# Patient Record
Sex: Female | Born: 1955
Health system: Southern US, Community
[De-identification: ages and names within clinical notes are randomized; demographics above are authoritative.]

## PROBLEM LIST (undated history)

## (undated) DIAGNOSIS — K921 Melena: Secondary | ICD-10-CM

## (undated) DIAGNOSIS — T7840XA Allergy, unspecified, initial encounter: Secondary | ICD-10-CM

## (undated) DIAGNOSIS — J40 Bronchitis, not specified as acute or chronic: Secondary | ICD-10-CM

## (undated) DIAGNOSIS — Z720 Tobacco use: Secondary | ICD-10-CM

## (undated) DIAGNOSIS — R519 Headache, unspecified: Secondary | ICD-10-CM

## (undated) DIAGNOSIS — I10 Essential (primary) hypertension: Secondary | ICD-10-CM

## (undated) DIAGNOSIS — R55 Syncope and collapse: Secondary | ICD-10-CM

## (undated) DIAGNOSIS — E669 Obesity, unspecified: Secondary | ICD-10-CM

## (undated) DIAGNOSIS — I714 Abdominal aortic aneurysm, without rupture, unspecified: Secondary | ICD-10-CM

## (undated) DIAGNOSIS — B019 Varicella without complication: Secondary | ICD-10-CM

## (undated) DIAGNOSIS — R51 Headache: Secondary | ICD-10-CM

## (undated) DIAGNOSIS — D649 Anemia, unspecified: Secondary | ICD-10-CM

## (undated) DIAGNOSIS — G8929 Other chronic pain: Secondary | ICD-10-CM

## (undated) HISTORY — DX: Melena: K92.1

## (undated) HISTORY — DX: Allergy, unspecified, initial encounter: T78.40XA

## (undated) HISTORY — DX: Headache: R51

## (undated) HISTORY — DX: Essential (primary) hypertension: I10

## (undated) HISTORY — DX: Headache, unspecified: R51.9

## (undated) HISTORY — DX: Anemia, unspecified: D64.9

## (undated) HISTORY — DX: Varicella without complication: B01.9

## (undated) HISTORY — DX: Tobacco use: Z72.0

## (undated) HISTORY — DX: Abdominal aortic aneurysm, without rupture, unspecified: I71.40

## (undated) HISTORY — DX: Other chronic pain: G89.29

## (undated) HISTORY — DX: Abdominal aortic aneurysm, without rupture: I71.4

## (undated) HISTORY — DX: Obesity, unspecified: E66.9

## (undated) HISTORY — DX: Bronchitis, not specified as acute or chronic: J40

---

## 1996-04-09 HISTORY — PX: ABDOMINAL HYSTERECTOMY: SHX81

## 1998-07-18 ENCOUNTER — Encounter: Admission: RE | Admit: 1998-07-18 | Discharge: 1998-10-16 | Payer: Self-pay | Admitting: *Deleted

## 2001-05-22 ENCOUNTER — Encounter: Payer: Self-pay | Admitting: Pulmonary Disease

## 2001-05-22 ENCOUNTER — Encounter: Admission: RE | Admit: 2001-05-22 | Discharge: 2001-05-22 | Payer: Self-pay | Admitting: Pulmonary Disease

## 2001-08-19 ENCOUNTER — Other Ambulatory Visit: Admission: RE | Admit: 2001-08-19 | Discharge: 2001-08-19 | Payer: Self-pay | Admitting: Obstetrics and Gynecology

## 2001-08-26 ENCOUNTER — Encounter: Payer: Self-pay | Admitting: Obstetrics and Gynecology

## 2001-08-26 ENCOUNTER — Ambulatory Visit (HOSPITAL_COMMUNITY): Admission: RE | Admit: 2001-08-26 | Discharge: 2001-08-26 | Payer: Self-pay | Admitting: Obstetrics and Gynecology

## 2002-07-29 ENCOUNTER — Inpatient Hospital Stay (HOSPITAL_COMMUNITY): Admission: AD | Admit: 2002-07-29 | Discharge: 2002-08-02 | Payer: Self-pay | Admitting: Pulmonary Disease

## 2002-07-29 ENCOUNTER — Encounter: Payer: Self-pay | Admitting: Pulmonary Disease

## 2002-08-05 ENCOUNTER — Encounter (HOSPITAL_BASED_OUTPATIENT_CLINIC_OR_DEPARTMENT_OTHER): Admission: RE | Admit: 2002-08-05 | Discharge: 2002-10-13 | Payer: Self-pay | Admitting: Pulmonary Disease

## 2002-10-14 ENCOUNTER — Encounter (HOSPITAL_BASED_OUTPATIENT_CLINIC_OR_DEPARTMENT_OTHER): Admission: RE | Admit: 2002-10-14 | Discharge: 2003-01-12 | Payer: Self-pay | Admitting: Pulmonary Disease

## 2002-10-22 ENCOUNTER — Ambulatory Visit (HOSPITAL_COMMUNITY): Admission: RE | Admit: 2002-10-22 | Discharge: 2002-10-22 | Payer: Self-pay | Admitting: Gastroenterology

## 2002-10-23 ENCOUNTER — Encounter (INDEPENDENT_AMBULATORY_CARE_PROVIDER_SITE_OTHER): Payer: Self-pay | Admitting: Specialist

## 2003-11-10 ENCOUNTER — Ambulatory Visit (HOSPITAL_COMMUNITY): Admission: RE | Admit: 2003-11-10 | Discharge: 2003-11-10 | Payer: Self-pay | Admitting: Pulmonary Disease

## 2004-08-21 ENCOUNTER — Ambulatory Visit: Payer: Self-pay | Admitting: Pulmonary Disease

## 2005-06-08 ENCOUNTER — Ambulatory Visit (HOSPITAL_COMMUNITY): Admission: RE | Admit: 2005-06-08 | Discharge: 2005-06-08 | Payer: Self-pay | Admitting: Pulmonary Disease

## 2006-02-13 ENCOUNTER — Ambulatory Visit: Payer: Self-pay | Admitting: Pulmonary Disease

## 2006-02-19 LAB — CONVERTED CEMR LAB
ALT: 30 units/L (ref 0–40)
AST: 28 units/L (ref 0–37)
BUN: 9 mg/dL (ref 6–23)
Basophils Relative: 0.5 % (ref 0.0–1.0)
Calcium: 9.7 mg/dL (ref 8.4–10.5)
Chloride: 103 meq/L (ref 96–112)
Eosinophil percent: 2.5 % (ref 0.0–5.0)
Glucose, Bld: 113 mg/dL — ABNORMAL HIGH (ref 70–99)
HCT: 41.2 % (ref 36.0–46.0)
HDL: 34.9 mg/dL — ABNORMAL LOW (ref >39.0)
Hemoglobin, Urine: NEGATIVE
Hemoglobin: 12.9 g/dL (ref 12.0–15.0)
Ketones, ur: NEGATIVE mg/dL
LDL Cholesterol: 75 mg/dL (ref 0–99)
Lymphocytes Relative: 39.5 % (ref 12.0–46.0)
MCV: 80.9 fL (ref 78.0–100.0)
Microalb, Ur: 0.9 mg/dL (ref 0.0–1.9)
Monocytes Absolute: 0.7 10*3/uL (ref 0.2–0.7)
Neutrophils Relative %: 47.9 % (ref 43.0–77.0)
Nitrite: NEGATIVE
Potassium: 4.3 meq/L (ref 3.5–5.1)
TSH: 1.33 microintl units/mL (ref 0.35–5.50)
Urine Glucose: NEGATIVE mg/dL
Urobilinogen, UA: 0.2 (ref 0.0–1.0)
VLDL: 25 mg/dL (ref 0–40)
WBC: 6.8 10*3/uL (ref 4.5–10.5)
pH: 7 (ref 5.0–8.0)

## 2006-03-04 ENCOUNTER — Ambulatory Visit: Payer: Self-pay | Admitting: Pulmonary Disease

## 2006-06-12 ENCOUNTER — Ambulatory Visit (HOSPITAL_COMMUNITY): Admission: RE | Admit: 2006-06-12 | Discharge: 2006-06-12 | Payer: Self-pay | Admitting: Pulmonary Disease

## 2007-04-04 ENCOUNTER — Ambulatory Visit: Payer: Self-pay | Admitting: Pulmonary Disease

## 2007-04-04 DIAGNOSIS — Z720 Tobacco use: Secondary | ICD-10-CM | POA: Insufficient documentation

## 2007-04-04 DIAGNOSIS — E119 Type 2 diabetes mellitus without complications: Secondary | ICD-10-CM | POA: Insufficient documentation

## 2007-04-04 DIAGNOSIS — J209 Acute bronchitis, unspecified: Secondary | ICD-10-CM | POA: Insufficient documentation

## 2007-04-04 DIAGNOSIS — I10 Essential (primary) hypertension: Secondary | ICD-10-CM | POA: Insufficient documentation

## 2007-05-09 ENCOUNTER — Ambulatory Visit: Payer: Self-pay | Admitting: Pulmonary Disease

## 2007-05-09 DIAGNOSIS — R519 Headache, unspecified: Secondary | ICD-10-CM | POA: Insufficient documentation

## 2007-05-09 DIAGNOSIS — R51 Headache: Secondary | ICD-10-CM

## 2007-05-17 LAB — CONVERTED CEMR LAB
Albumin: 3.4 g/dL — ABNORMAL LOW (ref 3.5–5.2)
Alkaline Phosphatase: 61 units/L (ref 39–117)
BUN: 14 mg/dL (ref 6–23)
Bacteria, UA: NEGATIVE
Basophils Absolute: 0 10*3/uL (ref 0.0–0.1)
Basophils Relative: 0.1 % (ref 0.0–1.0)
Bilirubin Urine: NEGATIVE
Cholesterol: 125 mg/dL (ref 0–200)
Creatinine, Ser: 0.8 mg/dL (ref 0.4–1.2)
Crystals: NEGATIVE
GFR calc Af Amer: 98 mL/min
GFR calc non Af Amer: 81 mL/min
HDL: 31.1 mg/dL — ABNORMAL LOW (ref >39.0)
Hgb A1c MFr Bld: 6.6 % — ABNORMAL HIGH (ref 4.6–6.0)
Ketones, ur: NEGATIVE mg/dL
LDL Cholesterol: 77 mg/dL (ref 0–99)
MCHC: 32.7 g/dL (ref 30.0–36.0)
Monocytes Relative: 10.8 % (ref 3.0–11.0)
Neutro Abs: 2.6 10*3/uL (ref 1.4–7.7)
Nitrite: NEGATIVE
Platelets: 261 10*3/uL (ref 150–400)
Potassium: 4.5 meq/L (ref 3.5–5.1)
RBC / HPF: NONE SEEN
RDW: 14.2 % (ref 11.5–14.6)
Sodium: 143 meq/L (ref 135–145)
Specific Gravity, Urine: 1.025 (ref 1.000–1.03)
TSH: 1.97 microintl units/mL (ref 0.35–5.50)
Total Bilirubin: 0.5 mg/dL (ref 0.3–1.2)
Triglycerides: 87 mg/dL (ref 0–149)
Urine Glucose: NEGATIVE mg/dL
Urobilinogen, UA: 0.2 (ref 0.0–1.0)
VLDL: 17 mg/dL (ref 0–40)
pH: 6.5 (ref 5.0–8.0)

## 2007-08-04 ENCOUNTER — Ambulatory Visit (HOSPITAL_COMMUNITY): Admission: RE | Admit: 2007-08-04 | Discharge: 2007-08-04 | Payer: Self-pay | Admitting: Pulmonary Disease

## 2007-10-15 ENCOUNTER — Telehealth (INDEPENDENT_AMBULATORY_CARE_PROVIDER_SITE_OTHER): Payer: Self-pay | Admitting: *Deleted

## 2007-12-23 ENCOUNTER — Ambulatory Visit: Payer: Self-pay | Admitting: Pulmonary Disease

## 2007-12-23 DIAGNOSIS — J101 Influenza due to other identified influenza virus with other respiratory manifestations: Secondary | ICD-10-CM | POA: Insufficient documentation

## 2008-10-21 ENCOUNTER — Emergency Department: Payer: Self-pay | Admitting: Emergency Medicine

## 2009-03-23 ENCOUNTER — Ambulatory Visit (HOSPITAL_COMMUNITY): Admission: RE | Admit: 2009-03-23 | Discharge: 2009-03-23 | Payer: Self-pay | Admitting: Pulmonary Disease

## 2010-05-06 ENCOUNTER — Emergency Department (HOSPITAL_COMMUNITY)
Admission: EM | Admit: 2010-05-06 | Discharge: 2010-05-06 | Payer: Self-pay | Source: Home / Self Care | Admitting: Emergency Medicine

## 2010-08-25 NOTE — Assessment & Plan Note (Signed)
[facility]                             PULMONARY OFFICE NOTE   Ashlee Williams, Ashlee Williams                    MRN:          [id number]  DATE:12/23/2006                            DOB:          03-25-1956    HISTORY OF PRESENT ILLNESS:  The patient is a pleasant female patient of  Dr. Kriste Basque, who presents today for an acute office visit.  The patient  complains over the last 2 days she has had some swelling along the 2nd  digit.  The patient complains that she stuck her finger with a brush  while fixing her hair 2 days ago and has some increased redness and  swelling along this finger over the last 2 days.  The patient denies any  fever, chest pain, palpitations, rash, or drainage.   PAST MEDICAL HISTORY:  Reviewed.   CURRENT MEDICATIONS:  Reviewed.   PHYSICAL EXAMINATION:  GENERAL:  The patient is a pleasant female in no  acute distress.  HEENT:  Unremarkable.  RESPIRATORY:  Lung sounds are clear.  CARDIAC:  Regular rate and rhythm.  ABDOMEN:  Soft and nontender.  EXTREMITIES:  Warm without any edema.  Along the left 2nd digit is some  noted small amount of swelling and slight redness along the cuticle bed.   IMPRESSION:  Left 2nd digit injury with possible early cellulitis.   PLAN:  The patient is given Duricef x7 days, warm soaks.  The patient is  to apply Neosporin along the cuticle once daily.  The patient will  return here in 1 week for followup or sooner if needed.      Rubye Oaks, NP  Electronically Signed      Lonzo Cloud. Kriste Basque, MD  Electronically Signed   TP/MedQ  DD: 12/23/2006  DT: 12/23/2006  Job #: [id number]

## 2010-08-25 NOTE — Discharge Summary (Signed)
NAME:  Ashlee Williams, Ashlee Williams                  ACCOUNT NO.:  [id number]   MEDICAL RECORD NO.:  [id number]                   PATIENT TYPE:  INP   LOCATION:  5020                                 FACILITY:  [facility]   PHYSICIAN:  Lonzo Cloud. Kriste Basque, M.D. [facility]            DATE OF BIRTH:  07-06-1955   DATE OF ADMISSION:  07/29/2002  DATE OF DISCHARGE:  08/02/2002                                 DISCHARGE SUMMARY   FINAL DIAGNOSES:  1. Admitted July 29, 2002, with an acute illness characterized by fever,     multiple chills, and pain in the left leg; evaluation revealing probable     cellulitis of left leg due to an abrasion she sustained one week prior to     admission and appeared to be healing.  2. History of hypertension - controlled on medications.  3. History of obesity - the patient is on diet therapy.  4. Status post hysterectomy about five years ago.   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  She had  recently had a routine physical with normal labs including CBC that showed a  hemoglobin of 12.6, hematocrit 37.3 and white count 5300 with 43%  neutrophils.  She felt well until one day prior to admission when she  developed low grade fever, although she did not take her temperature,  multiple cold chills, and slight nausea.  She denied any upper respiratory  tract symptoms without sore throat, sinus congestion or drainage or ear  aches.  She denied any vomiting or diarrhea.  She denied cough or sputum  production.  She denied any GU or GYN symptoms.  The only localizing symptom  was some slight pain in her left thigh and ankle area.  About one week prior  to admission she had fallen and sustained an abrasion on her left shin area.  She noticed that this healed over normally and was not draining any pus.  Initially there was no swelling in the leg either.   Evaluation in the office revealed  some mild warmth but minimal erythema in  the left leg and no discernible edema or difference in size as compared to  the right leg.  Blood count in our office, however, revealed a marked  leukocytosis with a white count of 29,700 and 92% segs compared to the CBC  of a week earlier.  Her temperature in the office was 100 degrees and she  was admitted with a probable cellulitis with atypical features.   PAST MEDICAL HISTORY:  She has a history of mild hypertension controlled on  Norvasc and Avalide recently added.  She has a history of obesity and is on  diet trying to lose weight.  She had a hysterectomy about five years ago.   PHYSICAL EXAMINATION:  Physical examination at the time of admission  revealed a 55 year old black female in  no acute distress.  Blood pressure  142/84, pulse 104 and regular, respiratory rate 20 per minute and not  labored, O2 saturation 96% on room air.  Temperature was 99.6 in the office  orally.  The HEENT examination was unremarkable.  Neck examination showed no  jugular venous distension, no carotid bruits, no thyromegaly or  lymphadenopathy.  Chest examination was clear to percussion and  auscultation.  Cardiac examination was normal with a normal S1 and S2; no  murmurs, rubs, or gallops heard.  The abdomen was obese but soft and  nontender without evidence of organomegaly or masses.  Bowel sounds were  intact.  Extremities showed an abrasion on the left shin which appeared to  be healing.  There was no fluctuance there and there was no drainage.  There  was no real redness around this area or up the leg.  She was somewhat tender  in the proximal thigh medially and this was different than palpation of the  other leg.  Neurologic examination was intact.   LABORATORY DATA:  EKG showed normal sinus rhythm with slight increased  voltage and nonspecific STT wave changes.   Chest x-ray showed some accentuated bronchial markings compatible with some  mild  bronchitic change, no acute infiltrate.   CBC showed hemoglobin 12.6, hematocrit 37.9, white count 27,300 with 95%  segs initially.  Follow-up CBC down to 10,600 and final CBC down to 7600  with 52% segs, indicating a nice response to the IV antibiotics.  Sed rate  was 42, repeat 84.  Protime 14.6, INR 1.1, PTT 33 seconds.  Sodium 138,  potassium 3.7, chloride 104, CO2 29, BUN 13, creatinine 1.1, blood sugar  112, repeat 88.  Calcium 8.9, total protein 6.9, albumin 3.3, AST 17, ALT  17, alkaline phosphatase 50, total bilirubin 0.7.  CPK 63.  TSH 0.51.  Blood  cultures negative x2.   HOSPITAL COURSE:  The patient was admitted with a cellulitis in her left leg  and some atypical features.  She was started on IV Kefzol and continued on  her home medications.  The leg subsequently developed some swelling and  erythema and warmth more indicative of cellulitis.  She had a nice response  to the antibiotics with decrease in her temperature (maxed out at 102 in the  hospital) and returned to totally normal on the IV Kefzol.  Because of the  swelling, venous Dopplers were obtained and these were negative and no  evidence of DVT, superficial thrombosis or Baker cyst.  They did note  enlargement of the left inguinal lymph nodes.  This may be the etiology of  the swelling with some lymphatic obstruction.  With leg elevation and TED  hose, the swelling improved prior to discharge.  She was covered with  Lovenox during the hospital stay.  With her white count totally normal and  temperature response as well, it was felt that she was ready to switch to  oral antibiotics and discharge on August 02, 2002.  At this point the leg was  still somewhat red, especially in the calf area and there was some mild  edema noted in the distal left leg.   DISCHARGE MEDICATIONS:  1. Keflex 500 mg p.o. q.i.d. for five more days until gone. 2. Norvasc 5 mg p.o. daily.  3. Avalide 150/12.5 one tablet p.o. daily.  4.  Zyrtec 10 mg p.o. daily p.r.n. allergies.  5. Laxative of choice.  6. Tylenol as needed for pain and fever.  DISCHARGE INSTRUCTIONS:  The patient was instructed to rest at home with leg  elevated and use some warm soaks.  She was instructed on a calorie  restricted low salt diet and fitted for TED hose and told to wear those when  she is up.   FOLLOW UP:  She will follow up with me in the office on Wednesday, August 05, 2002, at 9 a.m.   CONDITION ON DISCHARGE:  The patient is discharged in improved condition.                                               Lonzo Cloud. Kriste Basque, M.D. [facility]    SMN/MEDQ  D:  08/02/2002  T:  08/03/2002  Job:  [id number]

## 2010-08-25 NOTE — Assessment & Plan Note (Signed)
[facility]                               PULMONARY OFFICE NOTE   MISTEE, SOLIMAN                     MRN:          [id number]  DATE:02/13/2006                            DOB:          Oct 08, 1955    HISTORY OF PRESENT ILLNESS:  The patient is a 55 year old African-American  female patient of Dr. Kriste Basque with known history of hypertension, diabetes  mellitus who presents for an acute office visit.  The patient complained of  a one-week history of nasal congestion, productive cough of thick, yellow  sputum and hoarseness.  The patient denies any hemoptysis, chest pain,  orthopnea, recent travel, and antibiotic use.  The patient has not been seen  in greater than a year.  Previously had been started on Glucophage for  diabetes and had previously been on Norvasc and Avelox for blood pressure;  however, has been off of her medications for some time now.  The patient  reports she missed followup.  The patient continues to smoke against medical  advice.   PAST MEDICAL HISTORY:  Reviewed.   CURRENT MEDICATIONS:  Reviewed.   PHYSICAL EXAMINATION:  GENERAL: The patient is a pleasant obese female in no  acute distress.  VITAL SIGNS: She is afebrile with stable vital signs.  HEENT:  Nasal mucosa has some mild erythema.  Nontender sinuses. Posterior  pharynx is clear.  NECK:  Supple without adenopathy.  No JVD.  LUNGS: Sounds are clear.  CARDIAC: Regular rate and rhythm.  ABDOMEN: Soft.  EXTREMITIES:  Warm without any edema.   IMPRESSION AND PLAN:  1. Acute bronchitis. The patient is to begin Avelox x5 days, Mucinex DM      twice daily, Endal HD #8 ounces, 1-2 teaspoons every 4-6 hours as      needed for cough.  The patient is to return in 2 weeks with Dr. Kriste Basque      for followup or sooner if needed.  2. History of diabetes and hypertension. Fasting labs are all pending.      The patient will return      back for complete physical exam and followup  of diabetes and high blood      pressure at her office visit in 2 weeks.      Rubye Oaks, NP  Electronically Signed      Lonzo Cloud. Kriste Basque, MD  Electronically Signed   TP/MedQ  DD: 02/13/2006  DT: 02/13/2006  Job #: [id number]

## 2010-11-27 ENCOUNTER — Other Ambulatory Visit: Payer: Self-pay | Admitting: Pulmonary Disease

## 2010-11-27 DIAGNOSIS — Z1231 Encounter for screening mammogram for malignant neoplasm of breast: Secondary | ICD-10-CM

## 2010-11-30 ENCOUNTER — Ambulatory Visit (HOSPITAL_COMMUNITY)
Admission: RE | Admit: 2010-11-30 | Discharge: 2010-11-30 | Disposition: A | Discharge status: Home / Self Care | Payer: 59 | Source: Ambulatory Visit | Attending: Pulmonary Disease | Admitting: Pulmonary Disease

## 2010-11-30 DIAGNOSIS — Z1231 Encounter for screening mammogram for malignant neoplasm of breast: Secondary | ICD-10-CM | POA: Insufficient documentation

## 2011-02-05 ENCOUNTER — Ambulatory Visit (INDEPENDENT_AMBULATORY_CARE_PROVIDER_SITE_OTHER): Payer: 59 | Admitting: Family Medicine

## 2011-02-05 ENCOUNTER — Encounter: Payer: Self-pay | Admitting: Family Medicine

## 2011-02-05 DIAGNOSIS — I10 Essential (primary) hypertension: Secondary | ICD-10-CM

## 2011-02-05 DIAGNOSIS — M79609 Pain in unspecified limb: Secondary | ICD-10-CM

## 2011-02-05 DIAGNOSIS — R011 Cardiac murmur, unspecified: Secondary | ICD-10-CM

## 2011-02-05 DIAGNOSIS — Z0000 Encounter for general adult medical examination without abnormal findings: Secondary | ICD-10-CM

## 2011-02-05 DIAGNOSIS — E119 Type 2 diabetes mellitus without complications: Secondary | ICD-10-CM

## 2011-02-05 DIAGNOSIS — F17200 Nicotine dependence, unspecified, uncomplicated: Secondary | ICD-10-CM

## 2011-02-05 DIAGNOSIS — M79643 Pain in unspecified hand: Secondary | ICD-10-CM | POA: Insufficient documentation

## 2011-02-05 LAB — POCT URINALYSIS DIPSTICK
Blood, UA: NEGATIVE
Protein, UA: NEGATIVE
Spec Grav, UA: 1.005
Urobilinogen, UA: 0.2

## 2011-02-05 MED ORDER — NAPROXEN 500 MG PO TABS: 500.0000 mg | ORAL_TABLET | Freq: Two times a day (BID) | ORAL | Status: AC

## 2011-02-05 NOTE — Progress Notes (Signed)
  Subjective:    Patient ID: Ashlee Williams, female    DOB: Mar 11, 1956, 55 y.o.   MRN: [id number]  HPI New to establish.  Previous MD- Kriste Basque.  No GYN.  HTN- chronic problem for pt, well controlled on Diovan.  No CP, SOB, HAs, visual changes, edema.  DM- was told >1 yr ago that she had 'a slight case of diabetes'.  Previous MD wanted to start Metformin, pt never started meds.  Tobacco use- smokes 1/2 ppd, wants to quit but 'easier said than done'.  Not truly invested in this.  Hand pain- sxs started 1 week ago, having difficulty w/ grip due to pain.  Does a lot of work on the computer.  Minimal relief w/ tylenol or ibuprofen.  R hand.   Review of Systems For ROS see HPI     Objective:   Physical Exam  Vitals reviewed. Constitutional: She is oriented to person, place, and time. She appears well-developed and well-nourished. No distress.  HENT:  Head: Normocephalic and atraumatic.  Eyes: Conjunctivae and EOM are normal. Pupils are equal, round, and reactive to light.  Neck: Normal range of motion. Neck supple. No thyromegaly present.  Cardiovascular: Normal rate, regular rhythm and intact distal pulses.   Murmur (II/VI SEM) heard. Pulmonary/Chest: Effort normal and breath sounds normal. No respiratory distress.  Abdominal: Soft. She exhibits no distension. There is no tenderness.  Musculoskeletal: She exhibits no edema.       Right wrist: She exhibits tenderness (over palmar surface and along 1st CMC and MCP joints). She exhibits normal range of motion.  Lymphadenopathy:    She has no cervical adenopathy.  Neurological: She is alert and oriented to person, place, and time.  Skin: Skin is warm and dry.  Psychiatric: She has a normal mood and affect. Her behavior is normal.          Assessment & Plan:

## 2011-02-05 NOTE — Patient Instructions (Signed)
Schedule your complete physical at your convenience- you can eat before this We'll notify you of your [place] appt for your heart murmur We'll notify you of your lab results Try and stop smoking!!!  Look into the classes available at [email or web address] Take the Naproxen regularly for 10 days (take w/ food) and get a padded computer work station.  If no better in 2 weeks, let me know and we'll refer you to sports medicine Call with any questions or concerns Welcome!!  We're glad to have you!!!

## 2011-02-06 LAB — HEPATIC FUNCTION PANEL
ALT: 15 U/L (ref 0–35)
Albumin: 3.8 g/dL (ref 3.5–5.2)
Alkaline Phosphatase: 71 U/L (ref 39–117)
Bilirubin, Direct: 0 mg/dL (ref 0.0–0.3)
Total Protein: 7.8 g/dL (ref 6.0–8.3)

## 2011-02-06 LAB — CBC WITH DIFFERENTIAL/PLATELET
Basophils Absolute: 0 10*3/uL (ref 0.0–0.1)
Eosinophils Relative: 4.9 % (ref 0.0–5.0)
HCT: 41.8 % (ref 36.0–46.0)
Hemoglobin: 13.4 g/dL (ref 12.0–15.0)
Lymphocytes Relative: 60.4 % — ABNORMAL HIGH (ref 12.0–46.0)
Lymphs Abs: 3.5 10*3/uL (ref 0.7–4.0)
Monocytes Relative: 11 % (ref 3.0–12.0)
Neutro Abs: 1.4 10*3/uL (ref 1.4–7.7)
RBC: 5.07 Mil/uL (ref 3.87–5.11)
RDW: 15.7 % — ABNORMAL HIGH (ref 11.5–14.6)
WBC: 5.9 10*3/uL (ref 4.5–10.5)

## 2011-02-06 LAB — LIPID PANEL
Cholesterol: 117 mg/dL (ref 0–200)
LDL Cholesterol: 58 mg/dL (ref 0–99)
Total CHOL/HDL Ratio: 3

## 2011-02-06 LAB — HEMOGLOBIN A1C: Hgb A1c MFr Bld: 6.6 % — ABNORMAL HIGH (ref 4.6–6.5)

## 2011-02-06 LAB — BASIC METABOLIC PANEL
CO2: 27 mEq/L (ref 19–32)
Chloride: 108 mEq/L (ref 96–112)
Creatinine, Ser: 0.8 mg/dL (ref 0.4–1.2)
Glucose, Bld: 80 mg/dL (ref 70–99)

## 2011-02-07 LAB — VITAMIN D 1,25 DIHYDROXY
Vitamin D2 1, 25 (OH)2: <8 pg/mL
Vitamin D3 1, 25 (OH)2: 41 pg/mL

## 2011-02-09 ENCOUNTER — Encounter: Payer: Self-pay | Admitting: *Deleted

## 2011-02-10 ENCOUNTER — Encounter: Payer: Self-pay | Admitting: Family Medicine

## 2011-02-10 NOTE — Assessment & Plan Note (Signed)
Chronic problem.  Well controlled on current meds.  Asymptomatic.  No changes. 

## 2011-02-10 NOTE — Assessment & Plan Note (Signed)
Strongly encouraged pt to quit.  She states she is interested but not ready to select a quit date and commit.  Will follow.

## 2011-02-10 NOTE — Assessment & Plan Note (Signed)
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  Start scheduled NSAIDs, adjust work station.  If no improvement will refer to SM.  Pt expressed understanding and is in agreement w/ plan.

## 2011-02-10 NOTE — Assessment & Plan Note (Signed)
Unclear as to whether pt does or doesn't have dx.  Check labs.  Start meds prn.  Reviewed importance of low carb, ADA diet and regular exercise.  Pt expressed understanding and is in agreement w/ plan.

## 2011-02-13 ENCOUNTER — Other Ambulatory Visit (HOSPITAL_COMMUNITY): Payer: 59 | Admitting: Radiology

## 2011-02-13 ENCOUNTER — Encounter: Payer: Self-pay | Admitting: Family Medicine

## 2011-02-14 ENCOUNTER — Ambulatory Visit (HOSPITAL_COMMUNITY): Payer: 59 | Attending: Cardiovascular Disease | Admitting: Radiology

## 2011-02-14 ENCOUNTER — Ambulatory Visit (INDEPENDENT_AMBULATORY_CARE_PROVIDER_SITE_OTHER): Payer: 59 | Admitting: Family Medicine

## 2011-02-14 ENCOUNTER — Encounter: Payer: Self-pay | Admitting: Family Medicine

## 2011-02-14 VITALS — BP 120/80 | HR 80 | Temp 98.5°F | Ht 64.5 in | Wt 296.6 lb

## 2011-02-14 DIAGNOSIS — E119 Type 2 diabetes mellitus without complications: Secondary | ICD-10-CM | POA: Insufficient documentation

## 2011-02-14 DIAGNOSIS — Z01419 Encounter for gynecological examination (general) (routine) without abnormal findings: Secondary | ICD-10-CM

## 2011-02-14 DIAGNOSIS — Z0000 Encounter for general adult medical examination without abnormal findings: Secondary | ICD-10-CM | POA: Insufficient documentation

## 2011-02-14 DIAGNOSIS — N951 Menopausal and female climacteric states: Secondary | ICD-10-CM | POA: Insufficient documentation

## 2011-02-14 DIAGNOSIS — I059 Rheumatic mitral valve disease, unspecified: Secondary | ICD-10-CM | POA: Insufficient documentation

## 2011-02-14 DIAGNOSIS — I079 Rheumatic tricuspid valve disease, unspecified: Secondary | ICD-10-CM | POA: Insufficient documentation

## 2011-02-14 DIAGNOSIS — I10 Essential (primary) hypertension: Secondary | ICD-10-CM

## 2011-02-14 DIAGNOSIS — R011 Cardiac murmur, unspecified: Secondary | ICD-10-CM | POA: Insufficient documentation

## 2011-02-14 DIAGNOSIS — I379 Nonrheumatic pulmonary valve disorder, unspecified: Secondary | ICD-10-CM | POA: Insufficient documentation

## 2011-02-14 DIAGNOSIS — L680 Hirsutism: Secondary | ICD-10-CM | POA: Insufficient documentation

## 2011-02-14 MED ORDER — VALSARTAN-HYDROCHLOROTHIAZIDE 160-12.5 MG PO TABS: 1.0000 | ORAL_TABLET | Freq: Every day | ORAL | Status: DC

## 2011-02-14 NOTE — Patient Instructions (Signed)
Follow up in 3 months to recheck diabetes We will call you with your nutrition appt Try and make healthy food choices and get regular exercise Call with any questions or concerns Happy Holidays!!

## 2011-02-14 NOTE — Progress Notes (Signed)
  Subjective:    Patient ID: Ashlee Williams, female    DOB: 1955-11-19, 55 y.o.   MRN: [id number]  HPI CPE- no concerns today.  Had colonoscopy in 2009 at [place], UTD on mammo.   Review of Systems Patient reports no vision/ hearing changes, adenopathy,fever, weight change,  persistant/recurrent hoarseness , swallowing issues, chest pain, palpitations, edema, persistant/recurrent cough, hemoptysis, dyspnea (rest/exertional/paroxysmal nocturnal), gastrointestinal bleeding (melena, rectal bleeding), abdominal pain, significant heartburn, bowel changes, GU symptoms (dysuria, hematuria, incontinence), Gyn symptoms (abnormal  bleeding, pain),  syncope, focal weakness, memory loss, numbness & tingling, skin/hair/nail changes, abnormal bruising or bleeding, anxiety, or depression.     Objective:   Physical Exam  General Appearance:    Alert, cooperative, no distress, appears stated age, morbidly obese  Head:    Normocephalic, without obvious abnormality, atraumatic  Eyes:    PERRL, conjunctiva/corneas clear, EOM's intact, fundi    benign, both eyes  Ears:    Normal TM's and external ear canals, both ears  Nose:   Nares normal, septum midline, mucosa normal, no drainage    or sinus tenderness  Throat:   Lips, mucosa, and tongue normal; teeth and gums normal  Neck:   Supple, symmetrical, trachea midline, no adenopathy;    Thyroid: no enlargement/tenderness/nodules  Back:     Symmetric, no curvature, ROM normal, no CVA tenderness  Lungs:     Clear to auscultation bilaterally, respirations unlabored  Chest Wall:    No tenderness or deformity   Heart:    Regular rate and rhythm, S1 and S2 normal, no murmur, rub   or gallop  Breast Exam:    No tenderness, masses, or nipple abnormality  Abdomen:     Soft, non-tender, bowel sounds active all four quadrants,    no masses, no organomegaly  Genitalia:    External genitalia normal, adnexa w/out mass or tenderness, mucosa pink and moist, no lesions or  discharge present  Rectal:    Normal external appearance  Extremities:   Extremities normal, atraumatic, no cyanosis or edema  Pulses:   2+ and symmetric all extremities  Skin:   Skin color, texture, turgor normal, no rashes or lesions  Lymph nodes:   Cervical, supraclavicular, and axillary nodes normal  Neurologic:   CNII-XII intact, normal strength, sensation and reflexes    throughout          Assessment & Plan:

## 2011-02-18 NOTE — Assessment & Plan Note (Signed)
Pt's PE WNL w/ exception of weight.  Reviewed labs from last visit.  UTD on health maintenance.  Anticipatory guidance provided.

## 2011-02-18 NOTE — Assessment & Plan Note (Signed)
Refer to nutrition for better management.

## 2011-03-15 ENCOUNTER — Encounter: Payer: 59 | Attending: Family Medicine | Admitting: *Deleted

## 2011-03-15 ENCOUNTER — Encounter: Payer: Self-pay | Admitting: *Deleted

## 2011-03-15 DIAGNOSIS — I10 Essential (primary) hypertension: Secondary | ICD-10-CM

## 2011-03-15 DIAGNOSIS — E119 Type 2 diabetes mellitus without complications: Secondary | ICD-10-CM

## 2011-03-15 DIAGNOSIS — Z713 Dietary counseling and surveillance: Secondary | ICD-10-CM | POA: Insufficient documentation

## 2011-03-15 NOTE — Progress Notes (Addendum)
  Medical Nutrition Therapy:  Appt start time: 1630 end time:  1730.  Assessment:  T2DM, HTN Pt here for MNT re: T2DM and HTN.   MEDICATIONS:  See med list; reconciled with patient.   DIETARY INTAKE:  Usual eating pattern includes 2- 2.5 meals and 0-1 snacks per day.  24-hr recall:  B ( AM): BE&C croissant at hospital cafe; OJ (8 oz)  Snk ( AM): None  L ( PM): Grilled cheese w/ FF; 16 oz lemonade/tea mix (reg) - hosp cafe Snk ( PM): None D ( PM): spaghetti w/ meat sauce, 1 pc garlic toast; kiwi-strawberry juice (reg; from [employer]) Snk ( PM): None Beverages: water  Usual physical activity: No exercise noted.  Estimated energy needs: 1400-1500 calories 150-170 g carbohydrates 105-110 g protein  40 g fat  Progress Towards Goal(s):  In progress.   Nutritional Diagnosis:  Varnell-2.1 Impaired nutrient utilization related to excessive CHO intake as evidenced by recent A1c of 6.6% and MD referral for MNT.    Intervention/Goals:  Follow Diabetes Meal Plan as instructed (yellow card).  Eat 3 meals and 2 snacks (approx every 3-5 hrs) - NO meal skipping.  Add lean protein foods to all meals/snacks.   Limit sodium to 2000 mg or less daily.  Aim for 25-30 g of fiber daily.  Aim for 30+ mins of physical activity 3-5 days/wk.  Track food intake and bring food record to your next nutrition visit.   Handouts given during visit include:  HTN class handout  Living Well with DM book - [employer]  [employer] list  45g CHO menu  Monitoring/Evaluation:  Dietary intake, exercise, A1c (as available), and body weight in 5 week(s).

## 2011-03-19 NOTE — Patient Instructions (Signed)
Goals:  Follow Diabetes Meal Plan as instructed (yellow card).  Eat 3 meals and 2 snacks (approx every 3-5 hrs) - NO meal skipping.  Add lean protein foods to all meals/snacks.   Limit sodium to 2000 mg or less daily.  Aim for 25-30 g of fiber daily.  Aim for 30+ mins of physical activity 3-5 days/wk.  Track food intake and bring food record to your next nutrition visit.

## 2011-04-24 ENCOUNTER — Ambulatory Visit: Payer: 59 | Admitting: *Deleted

## 2011-04-25 ENCOUNTER — Ambulatory Visit (INDEPENDENT_AMBULATORY_CARE_PROVIDER_SITE_OTHER): Payer: 59 | Admitting: Family Medicine

## 2011-04-25 DIAGNOSIS — J3489 Other specified disorders of nose and nasal sinuses: Secondary | ICD-10-CM

## 2011-04-25 DIAGNOSIS — J019 Acute sinusitis, unspecified: Secondary | ICD-10-CM | POA: Insufficient documentation

## 2011-04-25 DIAGNOSIS — J329 Chronic sinusitis, unspecified: Secondary | ICD-10-CM | POA: Insufficient documentation

## 2011-04-25 MED ORDER — CLARITHROMYCIN ER 500 MG PO TB24: 1000.0000 mg | ORAL_TABLET | Freq: Every day | ORAL | Status: AC

## 2011-04-25 NOTE — Patient Instructions (Signed)
This is a sinus infection Start the biaxin- 2 tabs at the same time, w/ food Mucinex to thin your congestion Drink plenty of fluids! REST! Hang in there!

## 2011-04-25 NOTE — Progress Notes (Signed)
  Subjective:    Patient ID: Ashlee Williams, female    DOB: [birth year], 56 y.o.   MRN: [id number]  HPI ? Sinus infxn- sxs started yesterday.  + L maxillary pressure, HA, nasal congestion.  No fevers.  + sick contacts.   Review of Systems For ROS see HPI     Objective:   Physical Exam  Vitals reviewed. Constitutional: She appears well-developed and well-nourished. No distress.  HENT:  Head: Normocephalic and atraumatic.  Right Ear: Tympanic membrane normal.  Left Ear: Tympanic membrane normal.  Nose: Mucosal edema and rhinorrhea present. Right sinus exhibits maxillary sinus tenderness and frontal sinus tenderness. Left sinus exhibits maxillary sinus tenderness and frontal sinus tenderness.  Mouth/Throat: Uvula is midline and mucous membranes are normal. Posterior oropharyngeal erythema present. No oropharyngeal exudate.  Eyes: Conjunctivae and EOM are normal. Pupils are equal, round, and reactive to light.  Neck: Normal range of motion. Neck supple.  Cardiovascular: Normal rate, regular rhythm and normal heart sounds.   Pulmonary/Chest: Effort normal and breath sounds normal. No respiratory distress. She has no wheezes.  Lymphadenopathy:    She has no cervical adenopathy.          Assessment & Plan:

## 2011-04-25 NOTE — Assessment & Plan Note (Signed)
Pt's sxs and PE consistent w/ infxn.  Start abx.  Reviewed supportive care and red flags that should prompt return.  Pt expressed understanding and is in agreement w/ plan.  

## 2011-05-03 ENCOUNTER — Encounter: Payer: 59 | Attending: Family Medicine | Admitting: *Deleted

## 2011-05-03 ENCOUNTER — Encounter: Payer: Self-pay | Admitting: *Deleted

## 2011-05-03 DIAGNOSIS — Z713 Dietary counseling and surveillance: Secondary | ICD-10-CM | POA: Insufficient documentation

## 2011-05-03 DIAGNOSIS — E119 Type 2 diabetes mellitus without complications: Secondary | ICD-10-CM | POA: Insufficient documentation

## 2011-05-03 DIAGNOSIS — I10 Essential (primary) hypertension: Secondary | ICD-10-CM | POA: Insufficient documentation

## 2011-05-03 NOTE — Patient Instructions (Addendum)
Goals:  Continue previous goals.   Add lean protein foods to all meals/snacks.   Limit sugar sweetened drinks and sweets.   Start exercise at least 2 times/week for 20-30 min.

## 2011-05-03 NOTE — Progress Notes (Signed)
Medical Nutrition Therapy:  Appt start time: 1630 end time:  1700.  Primary Concerns Today:  T2DM, HTN; Follow up. Pt here for follow up. Still smoking cigarettes, but states she plans to quit soon. Has gained 2 lbs since last visit (03/15/11). Inconsistent CHO intake pattern noted; excessive CHO at breakfast with decreased CHO at lunch and meal skipping at dinner. Recently decreased stress level tremendously and feels great. No exercise started yet, but states she's thinking about going to the gym. No pain reported at this time.  TANITA BODY COMP RESULTS: %Fat: 51.7% Fat Mass: 154.0 lbs Fat Free Mass: 144.0 lbs Total Body Water: 105.5 lbs  MEDICATIONS:  See med list; reconciled with patient. No longer taking TamiFlu.   DIETARY INTAKE:  Usual eating pattern includes 2 meals and 0-1 snacks per day.  24-hr recall:  B (8:30 AM): BE&C croissant at hospital cafe; OJ (8 oz)  Snk ( AM): None  L (12:30 PM): Salad or 2 veggies and meat - hosp cafe Snk (3 PM): small apple or orange D (PM): SKIPS more often than not - sometimes has sandwich (no appetite) Snk ( PM): water  Usual physical activity: No exercise noted outside of ADLs.   Estimated energy needs: 1400-1500 calories 150-170 g carbohydrates 105-110 g protein  40 g fat  Progress Towards Goal(s):  In progress.   Nutritional Diagnosis:  [place]-2.1 Impaired nutrient utilization related to excessive CHO intake as evidenced by recent A1c of 6.6% and MD referral for diabetes education/MNT.    Intervention/Goals:  Continue previous goals.   Add lean protein foods to all meals/snacks.   Limit sugar sweetened drinks and sweets.   Start exercise at least 2 times/week for 20-30 min.  Monitoring/Evaluation:  Dietary intake, exercise, A1c (as available), and body weight in 6 week(s).

## 2011-05-17 ENCOUNTER — Ambulatory Visit (INDEPENDENT_AMBULATORY_CARE_PROVIDER_SITE_OTHER): Payer: 59 | Admitting: Family Medicine

## 2011-05-17 ENCOUNTER — Encounter: Payer: Self-pay | Admitting: Family Medicine

## 2011-05-17 DIAGNOSIS — J209 Acute bronchitis, unspecified: Secondary | ICD-10-CM

## 2011-05-17 DIAGNOSIS — I10 Essential (primary) hypertension: Secondary | ICD-10-CM

## 2011-05-17 DIAGNOSIS — E119 Type 2 diabetes mellitus without complications: Secondary | ICD-10-CM

## 2011-05-17 MED ORDER — IPRATROPIUM BROMIDE 0.02 % IN SOLN
0.5000 mg | Freq: Once | RESPIRATORY_TRACT | Status: AC
  Administered 2011-05-17: 0.5 mg via RESPIRATORY_TRACT

## 2011-05-17 MED ORDER — ALBUTEROL SULFATE (5 MG/ML) 0.5% IN NEBU
2.5000 mg | INHALATION_SOLUTION | Freq: Once | RESPIRATORY_TRACT | Status: AC
  Administered 2011-05-17: 2.5 mg via RESPIRATORY_TRACT

## 2011-05-17 MED ORDER — ALBUTEROL SULFATE HFA 108 (90 BASE) MCG/ACT IN AERS: 2.0000 | INHALATION_SPRAY | RESPIRATORY_TRACT | Status: DC | PRN

## 2011-05-17 MED ORDER — AZITHROMYCIN 250 MG PO TABS: 250.0000 mg | ORAL_TABLET | Freq: Every day | ORAL | Status: AC

## 2011-05-17 NOTE — Patient Instructions (Signed)
Follow up in 3 months to recheck diabetes We'll notify you of your lab results Start the Azithromycin as directed Add Mucinex to thin your congestion Use the Albuterol inhaler as needed for shortness of breath or wheezing Call with any questions or concerns Hang in there!!!

## 2011-05-17 NOTE — Progress Notes (Signed)
  Subjective:    Patient ID: Ashlee Williams, female    DOB: [birth year], 56 y.o.   MRN: [id number]  HPI HTN- chronic problem, BP is well controlled today on Diovan HCT.  No CP, HAs, visual changes, edema.  DM- new dx.  Not on meds, controlling w/ diet and exercise.  Has seen nutrition, working on low carb diet.  Is trying to increase her walking.  No N/V/D, abd pain.  Cough/chest congestion- sxs started ~1 week ago w/ increased cough, wheezing, SOB.  No fevers.  Cough is productive of yellow sputum.  No nasal congestion, no ear pain.  Hx of asthmatic bronchitis- this feels similar.   Review of Systems For ROS see HPI     Objective:   Physical Exam  Vitals reviewed. Constitutional: She is oriented to person, place, and time. She appears well-developed and well-nourished. No distress.  HENT:  Head: Normocephalic and atraumatic.       TMs normal bilaterally Mild nasal congestion Throat w/out erythema, edema, or exudate  Eyes: Conjunctivae and EOM are normal. Pupils are equal, round, and reactive to light.  Neck: Normal range of motion. Neck supple. No thyromegaly present.  Cardiovascular: Normal rate, regular rhythm, normal heart sounds and intact distal pulses.   No murmur heard. Pulmonary/Chest: Effort normal. No respiratory distress. She has wheezes (diffuse expiratory wheezes, much improved air movement and less wheezing  s/p neb).       + hacking cough  Abdominal: Soft. She exhibits no distension. There is no tenderness.  Musculoskeletal: She exhibits no edema.  Lymphadenopathy:    She has no cervical adenopathy.  Neurological: She is alert and oriented to person, place, and time.  Skin: Skin is warm and dry.  Psychiatric: She has a normal mood and affect. Her behavior is normal.          Assessment & Plan:

## 2011-05-18 ENCOUNTER — Encounter: Payer: Self-pay | Admitting: *Deleted

## 2011-05-18 LAB — BASIC METABOLIC PANEL
BUN: 15 mg/dL (ref 6–23)
CO2: 28 mEq/L (ref 19–32)
Calcium: 9.2 mg/dL (ref 8.4–10.5)
Creatinine, Ser: 0.9 mg/dL (ref 0.4–1.2)
Glucose, Bld: 88 mg/dL (ref 70–99)

## 2011-05-22 NOTE — Assessment & Plan Note (Signed)
Chronic problem.  Adequate control on current meds.  Asymptomatic.  No changes. 

## 2011-05-22 NOTE — Assessment & Plan Note (Signed)
New to provider.  Pt's sxs improved dramatically after neb tx.  Start abx.  Albuterol prn for cough/SOB/wheezing.  Reviewed supportive care and red flags that should prompt return.  Pt expressed understanding and is in agreement w/ plan.

## 2011-05-22 NOTE — Assessment & Plan Note (Signed)
Chronic problem.  Pt has seen nutrition.  Working on [employer] and trying to improve exercise.  Not requiring meds at this time.  Check labs and start meds prn.  Pt expressed understanding and is in agreement w/ plan.

## 2011-06-14 ENCOUNTER — Encounter: Payer: 59 | Attending: Family Medicine | Admitting: *Deleted

## 2011-06-14 ENCOUNTER — Encounter: Payer: Self-pay | Admitting: *Deleted

## 2011-06-14 DIAGNOSIS — Z713 Dietary counseling and surveillance: Secondary | ICD-10-CM | POA: Insufficient documentation

## 2011-06-14 DIAGNOSIS — E119 Type 2 diabetes mellitus without complications: Secondary | ICD-10-CM | POA: Insufficient documentation

## 2011-06-14 DIAGNOSIS — I10 Essential (primary) hypertension: Secondary | ICD-10-CM | POA: Insufficient documentation

## 2011-06-14 NOTE — Patient Instructions (Addendum)
Goals:   Continue previous goals.   Make sure to have protein with simple carbs (ex: fruit).  Keep kicking some booty!! :)

## 2011-06-14 NOTE — Progress Notes (Signed)
Medical Nutrition Therapy:  Appt start time: 1630 end time:  1700.  Primary Concerns Today:  T2DM, HTN; Follow up. Pt returns for f/u with a 2% decrease in body fat %. Reports she has discontinued eating croissants for breakfast, is drinking more water, and eating salad (+ protein) for lunch. Is no longer skipping dinner and reports eating fruit or Special K.  Returns to MD for repeat A1c May '13. Continues to smoke cigarettes and is still "working up to quitting".  Pt denies any DM sx at this time.  Lab Results  Component Value Date   HGBA1C 6.7* 05/17/2011    [facility]  BODY COMP RESULTS  05/03/11 06/14/11    %Fat 51.7% 49.8%    FM (lbs) 154.0 148.5    FFM (lbs) 144.0 150.0    TBW (lbs) 105.5 110.0     MEDICATIONS:  See med list; reconciled with patient. No longer taking TamiFlu.   DIETARY INTAKE:  Usual eating pattern includes 3 meals and 0-1 snacks per day.  24-hr recall:  B (8:30 AM): Bacon, egg, & cheese (no croissant) at hospital cafe; water Snk ( AM): None  L (12:30 PM): Salad or 2 veggies and meat - hosp cafe Snk (3 PM): small apple or orange D (PM): Fruit or Special K cereal Snk ( PM): water  Usual physical activity: Walking around neighborhood and sit ups - states she does consistently.   Estimated energy needs: 1400-1500 calories 150-170 g carbohydrates 105-110 g protein  40 g fat  Progress Towards Goal(s):  In progress.   Nutritional Diagnosis:  [place]-2.1 Impaired nutrient utilization related to excessive CHO intake as evidenced by recent A1c of 6.6% and MD referral for diabetes education/MNT.    Intervention/Goals:  Continue previous goals.   Make sure to have protein with simple carbs (ex: fruit).  Keep up the great work!! :)  Monitoring/Evaluation:  Dietary intake, exercise, A1c (as available), and body weight in 3 months.

## 2011-08-15 ENCOUNTER — Ambulatory Visit (INDEPENDENT_AMBULATORY_CARE_PROVIDER_SITE_OTHER): Payer: 59 | Admitting: Family Medicine

## 2011-08-15 ENCOUNTER — Encounter: Payer: Self-pay | Admitting: Family Medicine

## 2011-08-15 VITALS — BP 126/78 | HR 87 | Temp 98.4°F | Ht 63.5 in | Wt 295.6 lb

## 2011-08-15 DIAGNOSIS — R51 Headache: Secondary | ICD-10-CM

## 2011-08-15 DIAGNOSIS — E119 Type 2 diabetes mellitus without complications: Secondary | ICD-10-CM

## 2011-08-15 DIAGNOSIS — I10 Essential (primary) hypertension: Secondary | ICD-10-CM

## 2011-08-15 DIAGNOSIS — F17200 Nicotine dependence, unspecified, uncomplicated: Secondary | ICD-10-CM

## 2011-08-15 MED ORDER — MOMETASONE FUROATE 50 MCG/ACT NA SUSP: 2.0000 | Freq: Every day | NASAL | Status: DC

## 2011-08-15 NOTE — Progress Notes (Signed)
  Subjective:    Patient ID: Ashlee Williams, female    DOB: 04/01/1956, 56 y.o.   MRN: [id number]  HPI DM- chronic problem.  A1C at last visit 6.7.  Not on meds.  Controlling w/ diet and exercise.  Attempting to increase walking.  Saw nutrition and found this to be helpful.  Due for yearly eye exam.  No numbness or tingling or hands or feet, CP, SOB, N/V/D, edema.  HTN- chronic problem, well controlled on Diovan HCT.  Asymptomatic.  See above.  Tobacco use- cutting back.  Attempting to quit.  HAs- will start in R temple and radiate down side of face.  No nausea.  Some dizziness.  No sensitivity to light or sound.  Occuring nightly.  If she takes 2 ibuprofen prior to sleep will avoid HA.  'i'm a wild sleeper but mostly on the side'.  Will rarely occur during the day.  Will improve w/ ibuprofen.  No visual changes.  No preceding sxs.   Review of Systems For ROS see HPI     Objective:   Physical Exam  Vitals reviewed. Constitutional: She is oriented to person, place, and time. She appears well-developed and well-nourished. No distress.  HENT:  Head: Normocephalic and atraumatic.       TMs normal bilaterally No TTP over sinuses Bilateral nasal turbinate edema  Eyes: Conjunctivae and EOM are normal. Pupils are equal, round, and reactive to light.  Neck: Normal range of motion. Neck supple. No thyromegaly present.  Cardiovascular: Normal rate, regular rhythm, normal heart sounds and intact distal pulses.   No murmur heard. Pulmonary/Chest: Effort normal and breath sounds normal. No respiratory distress.  Abdominal: Soft. She exhibits no distension. There is no tenderness.  Musculoskeletal: She exhibits no edema.  Lymphadenopathy:    She has no cervical adenopathy.  Neurological: She is alert and oriented to person, place, and time. She has normal reflexes. No cranial nerve deficit. Coordination normal.       No current HA or TTP over temples bilaterally  Skin: Skin is warm and dry.    Psychiatric: She has a normal mood and affect. Her behavior is normal.          Assessment & Plan:

## 2011-08-15 NOTE — Patient Instructions (Signed)
Follow up in 3-4 months to recheck diabetes We'll notify you of your lab results and make any changes if needed Keep up the good work on diet and exercise Start the Nasonex- 2 sprays each nostril daily Continue the Zyrtec Add Sudafed or similar decongestant for 3-5 days to improve headache If your headache improves, cancel neuro appt Hang in there!!

## 2011-08-16 LAB — CBC WITH DIFFERENTIAL/PLATELET
Basophils Absolute: 0 10*3/uL (ref 0.0–0.1)
HCT: 39.8 % (ref 36.0–46.0)
Lymphs Abs: 2.6 10*3/uL (ref 0.7–4.0)
Monocytes Relative: 9.7 % (ref 3.0–12.0)
Platelets: 204 10*3/uL (ref 150.0–400.0)
RDW: 15.8 % — ABNORMAL HIGH (ref 11.5–14.6)

## 2011-08-16 LAB — BASIC METABOLIC PANEL
BUN: 17 mg/dL (ref 6–23)
Calcium: 9.2 mg/dL (ref 8.4–10.5)
Chloride: 107 mEq/L (ref 96–112)
Creatinine, Ser: 0.8 mg/dL (ref 0.4–1.2)
GFR: 97.28 mL/min (ref >60.00)

## 2011-08-16 LAB — HEMOGLOBIN A1C: Hgb A1c MFr Bld: 6.7 % — ABNORMAL HIGH (ref 4.6–6.5)

## 2011-08-25 NOTE — Assessment & Plan Note (Signed)
Pt reports R sided facial tingling and 'headache' occuring only at night.  Improves w/ NSAID use prior to sleep.  Currently asymptomatic.  Normal PE.  Due to daily occurrence will refer to neuro.  Pt expressed understanding and is in agreement w/ plan.

## 2011-08-25 NOTE — Assessment & Plan Note (Signed)
Chronic problem.  Pt attempting to quit.  Applauded her efforts.  Will continue to follow.

## 2011-08-25 NOTE — Assessment & Plan Note (Signed)
Chronic problem.  Well controlled.  Asymptomatic.  No changes. 

## 2011-08-25 NOTE — Assessment & Plan Note (Signed)
Chronic problem.  Not currently on meds.  Attempting to control w/ diet and exercise- has seen nutrition.  Encouraged her to schedule eye exam.  Check labs.  Adjust meds prn

## 2011-09-05 ENCOUNTER — Other Ambulatory Visit (INDEPENDENT_AMBULATORY_CARE_PROVIDER_SITE_OTHER): Payer: 59

## 2011-09-05 DIAGNOSIS — D708 Other neutropenia: Secondary | ICD-10-CM

## 2011-09-05 DIAGNOSIS — D709 Neutropenia, unspecified: Secondary | ICD-10-CM

## 2011-09-05 NOTE — Progress Notes (Signed)
LABS ONLY  

## 2011-09-06 ENCOUNTER — Encounter: Payer: Self-pay | Admitting: *Deleted

## 2011-09-06 LAB — CBC WITH DIFFERENTIAL/PLATELET
Eosinophils Relative: 6.2 % — ABNORMAL HIGH (ref 0.0–5.0)
HCT: 39.7 % (ref 36.0–46.0)
Lymphs Abs: 2.6 10*3/uL (ref 0.7–4.0)
Monocytes Relative: 11 % (ref 3.0–12.0)
Neutrophils Relative %: 25.5 % — ABNORMAL LOW (ref 43.0–77.0)
Platelets: 196 10*3/uL (ref 150.0–400.0)
WBC: 4.7 10*3/uL (ref 4.5–10.5)

## 2011-11-19 ENCOUNTER — Ambulatory Visit: Payer: 59 | Admitting: Family Medicine

## 2012-01-22 ENCOUNTER — Emergency Department (HOSPITAL_COMMUNITY): Payer: Self-pay

## 2012-01-22 ENCOUNTER — Encounter (HOSPITAL_COMMUNITY): Payer: Self-pay | Admitting: *Deleted

## 2012-01-22 ENCOUNTER — Emergency Department (HOSPITAL_COMMUNITY)
Admission: EM | Admit: 2012-01-22 | Discharge: 2012-01-22 | Disposition: A | Discharge status: Home / Self Care | Payer: Self-pay | Attending: Emergency Medicine | Admitting: Emergency Medicine

## 2012-01-22 DIAGNOSIS — Z79899 Other long term (current) drug therapy: Secondary | ICD-10-CM | POA: Insufficient documentation

## 2012-01-22 DIAGNOSIS — I10 Essential (primary) hypertension: Secondary | ICD-10-CM | POA: Insufficient documentation

## 2012-01-22 DIAGNOSIS — R0989 Other specified symptoms and signs involving the circulatory and respiratory systems: Secondary | ICD-10-CM | POA: Insufficient documentation

## 2012-01-22 DIAGNOSIS — R05 Cough: Secondary | ICD-10-CM | POA: Insufficient documentation

## 2012-01-22 DIAGNOSIS — J45909 Unspecified asthma, uncomplicated: Secondary | ICD-10-CM | POA: Insufficient documentation

## 2012-01-22 DIAGNOSIS — E119 Type 2 diabetes mellitus without complications: Secondary | ICD-10-CM | POA: Insufficient documentation

## 2012-01-22 DIAGNOSIS — R059 Cough, unspecified: Secondary | ICD-10-CM | POA: Insufficient documentation

## 2012-01-22 DIAGNOSIS — E669 Obesity, unspecified: Secondary | ICD-10-CM | POA: Insufficient documentation

## 2012-01-22 DIAGNOSIS — J40 Bronchitis, not specified as acute or chronic: Secondary | ICD-10-CM | POA: Insufficient documentation

## 2012-01-22 DIAGNOSIS — R51 Headache: Secondary | ICD-10-CM | POA: Insufficient documentation

## 2012-01-22 DIAGNOSIS — F17200 Nicotine dependence, unspecified, uncomplicated: Secondary | ICD-10-CM | POA: Insufficient documentation

## 2012-01-22 MED ORDER — HYDROMORPHONE HCL PF 1 MG/ML IJ SOLN
1.0000 mg | Freq: Once | INTRAMUSCULAR | Status: AC
  Administered 2012-01-22: 1 mg via INTRAMUSCULAR
  Filled 2012-01-22: qty 1

## 2012-01-22 MED ORDER — AMOXICILLIN 500 MG PO CAPS: 500.0000 mg | ORAL_CAPSULE | Freq: Three times a day (TID) | ORAL | Status: DC

## 2012-01-22 MED ORDER — HYDROCODONE-ACETAMINOPHEN 5-325 MG PO TABS: 1.0000 | ORAL_TABLET | Freq: Four times a day (QID) | ORAL | Status: DC | PRN

## 2012-01-22 NOTE — ED Notes (Signed)
Pt reports chest congestion x 1 week & head ache for the past 2 days. Denies any fever. Has taken advil @ 1300.

## 2012-01-22 NOTE — ED Provider Notes (Signed)
History   This chart was scribed for Ashlee Lennert, MD by Gerlean Ren. This patient was seen in room APA18/APA18 and the patient's care was started at 20:34.   CSN: [id number]  Arrival date & time 01/22/12  1478   First MD Initiated Contact with Patient 01/22/12 2032      Chief Complaint  Patient presents with  . Headache    (Consider location/radiation/quality/duration/timing/severity/associated sxs/prior treatment) The history is provided by the patient. No language interpreter was used.   Ashlee Williams is a 56 y.o. female who presents to the Emergency Department complaining of 2 days of constant, non-radiating HA not relieved by migraine OCM with associated 7 days of chest congestion.  Pt reports h/o similar HA normally relieved by migraine OCM.  Pt denies rhinorrhea, fever, neck pain, sore throat, visual disturbance, CP, dyspnea, abdominal pain, nausea, emesis, diarrhea, urinary symptoms, back pain, weakness, numbness and rash as associated symptoms.  Pt is a current everyday smoker and reports occasional alcohol use.    Past Medical History  Diagnosis Date  . Asthma   . Allergy   . Hypertension   . Blood in stool   . Chronic headaches   . Chicken pox   . Bronchitis   . Diabetes mellitus     PT. IS TRYING TO CONTROL WITH DIET BUT AS NOTED WEIGHT IS UP  . Tobacco abuse   . Obesity     Past Surgical History  Procedure Date  . Abdominal hysterectomy 98 or 99    Family History  Problem Relation Age of Onset  . Hypertension Mother   . Diabetes Sister   . Hypertension Sister   . Cancer Maternal Aunt   . Hypertension Maternal Aunt   . Stroke Maternal Grandmother   . Hypertension Maternal Grandmother   . Cancer Maternal Grandfather   . Hypertension Maternal Grandfather   . Hypertension Father   . Hypertension Brother   . Hypertension Maternal Uncle   . Hypertension Paternal Aunt   . Hypertension Paternal Uncle   . Hypertension Paternal Grandmother   .  Hypertension Paternal Grandfather     History  Substance Use Topics  . Smoking status: Current Every Day Smoker -- 0.5 packs/day for 10 years    Types: Cigarettes  . Smokeless tobacco: Not on file  . Alcohol Use: Yes     occasionally    No OB history provided.  Review of Systems  Constitutional: Negative for fatigue.  HENT: Negative for congestion, sinus pressure and ear discharge.   Eyes: Negative for discharge.  Respiratory: Positive for cough.   Cardiovascular: Negative for chest pain.  Gastrointestinal: Negative for abdominal pain and diarrhea.  Genitourinary: Negative for frequency and hematuria.  Musculoskeletal: Negative for back pain.  Skin: Negative for rash.  Neurological: Positive for headaches. Negative for seizures.  Hematological: Negative.   Psychiatric/Behavioral: Negative for hallucinations.    Allergies  Review of patient's allergies indicates no known allergies.  Home Medications   Current Outpatient Rx  Name Route Sig Dispense Refill  . MIDRIN 325-65-100 MG PO CAPS Oral Take 1 capsule by mouth every 6 (six) hours as needed.     Marland Kitchen CETIRIZINE HCL 10 MG PO TABS Oral Take 10 mg by mouth daily.    Marland Kitchen NAPROXEN 500 MG PO TABS Oral Take 1 tablet (500 mg total) by mouth 2 (two) times daily with a meal. 60 tablet 2  . VALSARTAN-HYDROCHLOROTHIAZIDE 160-12.5 MG PO TABS Oral Take 1 tablet by mouth daily.  30 tablet 11    BP 168/92  Pulse 65  Temp 97.9 F (36.6 C)  Resp 20  Ht 5\' 4"  (1.626 m)  Wt 290 lb (131.543 kg)  BMI 49.78 kg/m2  SpO2 98%  LMP 11/10/2010  Physical Exam  Nursing note and vitals reviewed. Constitutional: She is oriented to person, place, and time. She appears well-developed.  HENT:  Head: Normocephalic and atraumatic.       Sinuses non-tender.  Eyes: Conjunctivae normal and EOM are normal. No scleral icterus.  Neck: Neck supple. No thyromegaly present.  Cardiovascular: Normal rate and regular rhythm.  Exam reveals no gallop and no  friction rub.   No murmur heard. Pulmonary/Chest: No stridor. She has no wheezes. She has rales. She exhibits no tenderness.  Abdominal: She exhibits no distension. There is no tenderness. There is no rebound.  Musculoskeletal: Normal range of motion. She exhibits no edema.  Lymphadenopathy:    She has no cervical adenopathy.  Neurological: She is oriented to person, place, and time. Coordination normal.  Skin: No rash noted. No erythema.  Psychiatric: She has a normal mood and affect. Her behavior is normal.    ED Course  Procedures (including critical care time) DIAGNOSTIC STUDIES: Oxygen Saturation is 98% on room air, normal by my interpretation.    COORDINATION OF CARE: 20:39- Patient informed of clinical course, understands medical decision-making process, and agrees with plan.  Ordered IV dilaudid and chest XR.      Labs Reviewed - No data to display Dg Chest 2 View  01/22/2012  *RADIOLOGY REPORT*  Clinical Data: Headache and chest congestion for 1 week. Nonsmoker.  Hypertension.  Diabetes.  CHEST - 2 VIEW  Comparison: 05/09/2007  Findings: Midline trachea.  Normal heart size.  Tortuous descending thoracic aorta. No pleural effusion or pneumothorax.  Clear lungs.  IMPRESSION: No acute cardiopulmonary disease.   Original Report Authenticated By: Consuello Bossier, M.D.      No diagnosis found.    MDM   The chart was scribed for me under my direct supervision.  I personally performed the history, physical, and medical decision making and all procedures in the evaluation of this patient.Ashlee Lennert, MD 01/22/12 2240

## 2012-01-22 NOTE — ED Notes (Signed)
C/o headache for the past 2 days and chest congestion for over a week

## 2012-01-22 NOTE — ED Notes (Signed)
MD at bedside. 

## 2012-01-22 NOTE — ED Notes (Signed)
Pt alert & oriented x4, stable gait. Patient given discharge instructions, paperwork & prescription(s). Patient  instructed to stop at the registration desk to finish any additional paperwork. Patient verbalized understanding. Pt left department w/ no further questions. 

## 2013-10-19 ENCOUNTER — Encounter: Payer: Self-pay | Admitting: Family Medicine

## 2013-10-19 ENCOUNTER — Ambulatory Visit (INDEPENDENT_AMBULATORY_CARE_PROVIDER_SITE_OTHER): Payer: 59 | Admitting: Family Medicine

## 2013-10-19 VITALS — BP 130/80 | HR 73 | Temp 98.0°F | Resp 16 | Wt 264.0 lb

## 2013-10-19 DIAGNOSIS — E119 Type 2 diabetes mellitus without complications: Secondary | ICD-10-CM

## 2013-10-19 DIAGNOSIS — I10 Essential (primary) hypertension: Secondary | ICD-10-CM

## 2013-10-19 MED ORDER — VALSARTAN-HYDROCHLOROTHIAZIDE 160-12.5 MG PO TABS: 1.0000 | ORAL_TABLET | Freq: Every day | ORAL | Status: DC

## 2013-10-19 NOTE — Patient Instructions (Signed)
Schedule your complete physical in 3-6 months We'll notify you of your lab results and make any changes if needed Keep up the good work on healthy diet and regular exercise- you're doing it!!! Call with any questions or concerns Enjoy your time off and your upcoming birthday!!!

## 2013-10-19 NOTE — Progress Notes (Signed)
Pre visit review using our clinic review tool, if applicable. No additional management support is needed unless otherwise documented below in the visit note. 

## 2013-10-19 NOTE — Assessment & Plan Note (Signed)
Chronic problem.  Pt has lost 25 lbs.  Applauded her efforts.  Pt to continue to focus on healthy diet and regular exercise.  Check labs to risk stratify.  Will follow.

## 2013-10-19 NOTE — Progress Notes (Signed)
   Subjective:    Patient ID: Ashlee Williams, female    DOB: [birth year], 57 y.o.   MRN: [id number]  HPI HTN- chronic problem, was previously well controlled on Diovan HCTZ.  Has been out of medications.  Will have intermittent HAs when BP is high- 165/95 last week at work.  No CP, SOB, visual changes, edema.  DM- chronic problem, pt has been attempting to control w/ healthy diet and regular exercise.  Has lost 25 lbs since October 2014.  Exercising, drinking more water.  Due for eye exam.  No numbness/tingling hands/feet.  Obesity- chronic problem, pt is exercising and changing diet.  Has lost 25 lbs and 4 points on BMI scale.   Review of Systems For ROS see HPI     Objective:   Physical Exam  Vitals reviewed. Constitutional: She is oriented to person, place, and time. She appears well-developed and well-nourished. No distress.  obese  HENT:  Head: Normocephalic and atraumatic.  Eyes: Conjunctivae and EOM are normal. Pupils are equal, round, and reactive to light.  Neck: Normal range of motion. Neck supple. No thyromegaly present.  Cardiovascular: Normal rate, regular rhythm, normal heart sounds and intact distal pulses.   No murmur heard. Pulmonary/Chest: Effort normal and breath sounds normal. No respiratory distress.  Abdominal: Soft. She exhibits no distension. There is no tenderness.  Musculoskeletal: She exhibits no edema.  Lymphadenopathy:    She has no cervical adenopathy.  Neurological: She is alert and oriented to person, place, and time.  Skin: Skin is warm and dry.  Psychiatric: She has a normal mood and affect. Her behavior is normal.          Assessment & Plan:

## 2013-10-19 NOTE — Assessment & Plan Note (Signed)
Chronic problem.  Pt has been attempting to control w/ healthy diet and regular exercise.  Has lost 25 lbs!  Applauded her efforts.  Encouraged her to schedule her eye exam- overdue.  Check labs.  Adjust tx plan prn.

## 2013-10-19 NOTE — Assessment & Plan Note (Signed)
Chronic.  Pt has been out of meds and BP has been increasing- particularly at work.  Restart Losartan-HCTZ.  Check labs.  Anticipatory guidance provided.

## 2013-10-20 ENCOUNTER — Telehealth: Payer: Self-pay | Admitting: Family Medicine

## 2013-10-20 LAB — BASIC METABOLIC PANEL WITH GFR
BUN: 12 mg/dL (ref 6–23)
CO2: 27 meq/L (ref 19–32)
Calcium: 9 mg/dL (ref 8.4–10.5)
Chloride: 106 meq/L (ref 96–112)
Creatinine, Ser: 0.8 mg/dL (ref 0.4–1.2)
GFR: 96.51 mL/min
Glucose, Bld: 77 mg/dL (ref 70–99)
Potassium: 3.9 meq/L (ref 3.5–5.1)
Sodium: 140 meq/L (ref 135–145)

## 2013-10-20 LAB — CBC WITH DIFFERENTIAL/PLATELET
Basophils Absolute: 0 K/uL (ref 0.0–0.1)
Basophils Relative: 0.2 % (ref 0.0–3.0)
Eosinophils Absolute: 0.2 K/uL (ref 0.0–0.7)
Eosinophils Relative: 2.8 % (ref 0.0–5.0)
HCT: 41 % (ref 36.0–46.0)
Hemoglobin: 13.1 g/dL (ref 12.0–15.0)
Lymphocytes Relative: 38.8 % (ref 12.0–46.0)
Lymphs Abs: 2.3 K/uL (ref 0.7–4.0)
MCHC: 32.1 g/dL (ref 30.0–36.0)
MCV: 83.4 fl (ref 78.0–100.0)
Monocytes Absolute: 0.3 K/uL (ref 0.1–1.0)
Monocytes Relative: 4.6 % (ref 3.0–12.0)
Neutro Abs: 3.2 K/uL (ref 1.4–7.7)
Neutrophils Relative %: 53.6 % (ref 43.0–77.0)
Platelets: 216 K/uL (ref 150.0–400.0)
RBC: 4.91 Mil/uL (ref 3.87–5.11)
RDW: 15.6 % — ABNORMAL HIGH (ref 11.5–15.5)
WBC: 6 K/uL (ref 4.0–10.5)

## 2013-10-20 LAB — HEPATIC FUNCTION PANEL
ALT: 16 U/L (ref 0–35)
AST: 17 U/L (ref 0–37)
Albumin: 3.5 g/dL (ref 3.5–5.2)
Alkaline Phosphatase: 60 U/L (ref 39–117)
Bilirubin, Direct: 0.1 mg/dL (ref 0.0–0.3)
Total Bilirubin: 0.5 mg/dL (ref 0.2–1.2)
Total Protein: 7.1 g/dL (ref 6.0–8.3)

## 2013-10-20 LAB — LIPID PANEL
CHOL/HDL RATIO: 3
Cholesterol: 116 mg/dL (ref 0–200)
HDL: 37.6 mg/dL — ABNORMAL LOW (ref >39.00)
LDL CALC: 62 mg/dL (ref 0–99)
NONHDL: 78.4
Triglycerides: 81 mg/dL (ref 0.0–149.0)
VLDL: 16.2 mg/dL (ref 0.0–40.0)

## 2013-10-20 LAB — TSH: TSH: 0.97 u[IU]/mL (ref 0.35–4.50)

## 2013-10-20 LAB — HEMOGLOBIN A1C: Hgb A1c MFr Bld: 6.4 % (ref 4.6–6.5)

## 2013-10-20 NOTE — Telephone Encounter (Signed)
Relevant patient education mailed to patient.  

## 2013-10-20 NOTE — Telephone Encounter (Signed)
Relevant patient education assigned to patient using Emmi. ° °

## 2013-10-21 ENCOUNTER — Encounter: Payer: Self-pay | Admitting: General Practice

## 2014-02-03 ENCOUNTER — Ambulatory Visit (INDEPENDENT_AMBULATORY_CARE_PROVIDER_SITE_OTHER): Payer: 59 | Admitting: Family Medicine

## 2014-02-03 ENCOUNTER — Encounter: Payer: Self-pay | Admitting: Family Medicine

## 2014-02-03 VITALS — BP 146/82 | HR 72 | Temp 97.1°F | Resp 16 | Ht 63.0 in | Wt 266.2 lb

## 2014-02-03 DIAGNOSIS — E2839 Other primary ovarian failure: Secondary | ICD-10-CM

## 2014-02-03 DIAGNOSIS — Z1231 Encounter for screening mammogram for malignant neoplasm of breast: Secondary | ICD-10-CM

## 2014-02-03 DIAGNOSIS — F17200 Nicotine dependence, unspecified, uncomplicated: Secondary | ICD-10-CM

## 2014-02-03 DIAGNOSIS — Z720 Tobacco use: Secondary | ICD-10-CM

## 2014-02-03 DIAGNOSIS — Z1211 Encounter for screening for malignant neoplasm of colon: Secondary | ICD-10-CM

## 2014-02-03 DIAGNOSIS — Z01419 Encounter for gynecological examination (general) (routine) without abnormal findings: Secondary | ICD-10-CM

## 2014-02-03 DIAGNOSIS — E119 Type 2 diabetes mellitus without complications: Secondary | ICD-10-CM

## 2014-02-03 DIAGNOSIS — Z23 Encounter for immunization: Secondary | ICD-10-CM

## 2014-02-03 LAB — CBC WITH DIFFERENTIAL/PLATELET
Basophils Absolute: 0 10*3/uL (ref 0.0–0.1)
Basophils Relative: 0.7 % (ref 0.0–3.0)
EOS PCT: 3.2 % (ref 0.0–5.0)
Eosinophils Absolute: 0.2 10*3/uL (ref 0.0–0.7)
HCT: 38.3 % (ref 36.0–46.0)
HEMOGLOBIN: 12.4 g/dL (ref 12.0–15.0)
LYMPHS ABS: 2.5 10*3/uL (ref 0.7–4.0)
Lymphocytes Relative: 36 % (ref 12.0–46.0)
MCHC: 32.4 g/dL (ref 30.0–36.0)
MCV: 81.7 fl (ref 78.0–100.0)
MONOS PCT: 6.7 % (ref 3.0–12.0)
Monocytes Absolute: 0.5 10*3/uL (ref 0.1–1.0)
Neutro Abs: 3.7 10*3/uL (ref 1.4–7.7)
Neutrophils Relative %: 53.4 % (ref 43.0–77.0)
Platelets: 210 10*3/uL (ref 150.0–400.0)
RBC: 4.69 Mil/uL (ref 3.87–5.11)
RDW: 15.3 % (ref 11.5–15.5)
WBC: 6.9 10*3/uL (ref 4.0–10.5)

## 2014-02-03 LAB — BASIC METABOLIC PANEL
BUN: 13 mg/dL (ref 6–23)
CHLORIDE: 106 meq/L (ref 96–112)
CO2: 25 mEq/L (ref 19–32)
CREATININE: 0.9 mg/dL (ref 0.4–1.2)
Calcium: 9 mg/dL (ref 8.4–10.5)
GFR: 82.95 mL/min (ref >60.00)
Glucose, Bld: 73 mg/dL (ref 70–99)
Potassium: 3.6 mEq/L (ref 3.5–5.1)
Sodium: 139 mEq/L (ref 135–145)

## 2014-02-03 LAB — LIPID PANEL
CHOL/HDL RATIO: 3
Cholesterol: 120 mg/dL (ref 0–200)
HDL: 42.2 mg/dL (ref >39.00)
LDL Cholesterol: 62 mg/dL (ref 0–99)
NonHDL: 77.8
Triglycerides: 78 mg/dL (ref 0.0–149.0)
VLDL: 15.6 mg/dL (ref 0.0–40.0)

## 2014-02-03 LAB — HEPATIC FUNCTION PANEL
ALBUMIN: 3.1 g/dL — AB (ref 3.5–5.2)
ALK PHOS: 61 U/L (ref 39–117)
ALT: 15 U/L (ref 0–35)
AST: 16 U/L (ref 0–37)
Bilirubin, Direct: 0 mg/dL (ref 0.0–0.3)
Total Bilirubin: 0.4 mg/dL (ref 0.2–1.2)
Total Protein: 7.3 g/dL (ref 6.0–8.3)

## 2014-02-03 LAB — HEMOGLOBIN A1C: Hgb A1c MFr Bld: 6.3 % (ref 4.6–6.5)

## 2014-02-03 LAB — TSH: TSH: 0.65 u[IU]/mL (ref 0.35–4.50)

## 2014-02-03 LAB — VITAMIN D 25 HYDROXY (VIT D DEFICIENCY, FRACTURES): VITD: 9.7 ng/mL — AB (ref 30.00–100.00)

## 2014-02-03 NOTE — Assessment & Plan Note (Signed)
Chronic problem.  UTD on eye exam.  On ARB for renal protection.  Attempting to control w/ healthy diet but not losing weight.  No numbness/tingling of hands/feet.  Check labs.  Start meds prn.

## 2014-02-03 NOTE — Progress Notes (Signed)
   Subjective:    Patient ID: Caren GriffinsStanley A Zeitlin, female    DOB: [birth year], 58 y.o.   MRN: [id number]  HPI CPE- pt [facility] and [facility] ([facility]).  No need for paps due to hysterectomy.  Has never had DEXA.  + tobacco use- pt is attempting to cut back in effort to quit.  UTD on eye exam- Jan 14, 2014- no problems identified.   Review of Systems Patient reports no vision/ hearing changes, adenopathy,fever, weight change,  persistant/recurrent hoarseness , swallowing issues, chest pain, palpitations, edema, persistant/recurrent cough, hemoptysis, dyspnea (rest/exertional/paroxysmal nocturnal), abdominal pain, significant heartburn, bowel changes, GU symptoms (dysuria, hematuria, incontinence), Gyn symptoms (abnormal  bleeding, pain),  syncope, focal weakness, memory loss, numbness & tingling, skin/hair/nail changes, abnormal bruising or bleeding, anxiety, or depression.   Episode of BRBPR last week but this has since cleared, 'i'll just wait for my colonoscopy'    Objective:   Physical Exam General Appearance:    Alert, cooperative, no distress, appears stated age, obese  Head:    Normocephalic, without obvious abnormality, atraumatic  Eyes:    PERRL, conjunctiva/corneas clear, EOM's intact, fundi    benign, both eyes  Ears:    Normal TM's and external ear canals, both ears  Nose:   Nares normal, septum midline, mucosa normal, no drainage    or sinus tenderness  Throat:   Lips, mucosa, and tongue normal; teeth and gums normal  Neck:   Supple, symmetrical, trachea midline, no adenopathy;    Thyroid: no enlargement/tenderness/nodules  Back:     Symmetric, no curvature, ROM normal, no CVA tenderness  Lungs:     Clear to auscultation bilaterally, respirations unlabored  Chest Wall:    No tenderness or deformity   Heart:    Regular rate and rhythm, S1 and S2 normal, no murmur, rub   or gallop  Breast Exam:    Deferred to mammo  Abdomen:     Soft, non-tender, bowel sounds  active all four quadrants,    no masses, no organomegaly  Genitalia:    Deferred at pt's request  Rectal:    Extremities:   Extremities normal, atraumatic, no cyanosis or edema  Pulses:   2+ and symmetric all extremities  Skin:   Skin color, texture, turgor normal, no rashes or lesions  Lymph nodes:   Cervical, supraclavicular, and axillary nodes normal  Neurologic:   CNII-XII intact, normal strength, sensation and reflexes    throughout          Assessment & Plan:

## 2014-02-03 NOTE — Assessment & Plan Note (Signed)
Pt's PE WNL w/ exception of obesity.  Overdue on mammo, DEXA, colonoscopy- orders entered.  Check labs.  Encouraged pt to work on [employer] diet and regular exercise.

## 2014-02-03 NOTE — Progress Notes (Signed)
Pre visit review using our clinic review tool, if applicable. No additional management support is needed unless otherwise documented below in the visit note. 

## 2014-02-03 NOTE — Assessment & Plan Note (Signed)
Again encouraged smoking cessation.  Pneumovax given today.  Will follow.

## 2014-02-03 NOTE — Patient Instructions (Signed)
Follow up in 3-4 months to recheck diabetes We'll notify you of your lab results and make any changes if needed Try and make healthy food choices and get regular exercise QUIT SMOKING!  You can do it! [facility] We'll call you with your GI appt for the colonoscopy Call with any questions or concerns Happy fall!!!

## 2014-02-04 ENCOUNTER — Telehealth: Payer: Self-pay | Admitting: Family Medicine

## 2014-02-04 NOTE — Telephone Encounter (Signed)
emmi emailed °

## 2014-02-05 ENCOUNTER — Encounter: Payer: Self-pay | Admitting: General Practice

## 2014-02-05 ENCOUNTER — Encounter: Payer: Self-pay | Admitting: Internal Medicine

## 2014-02-05 MED ORDER — VITAMIN D (ERGOCALCIFEROL) 1.25 MG (50000 UNIT) PO CAPS: 50000.0000 [IU] | ORAL_CAPSULE | ORAL | Status: DC

## 2014-02-09 ENCOUNTER — Encounter: Payer: Self-pay | Admitting: Internal Medicine

## 2014-02-23 ENCOUNTER — Other Ambulatory Visit: Payer: Self-pay | Admitting: Internal Medicine

## 2014-02-23 MED ORDER — AMOXICILLIN-POT CLAVULANATE 875-125 MG PO TABS: 1.0000 | ORAL_TABLET | Freq: Two times a day (BID) | ORAL | Status: DC

## 2014-03-12 ENCOUNTER — Ambulatory Visit (HOSPITAL_COMMUNITY)
Admission: RE | Admit: 2014-03-12 | Discharge: 2014-03-12 | Disposition: A | Discharge status: Home / Self Care | Payer: 59 | Source: Ambulatory Visit | Attending: Family Medicine | Admitting: Family Medicine

## 2014-03-12 DIAGNOSIS — Z1231 Encounter for screening mammogram for malignant neoplasm of breast: Secondary | ICD-10-CM

## 2014-03-12 DIAGNOSIS — Z13820 Encounter for screening for osteoporosis: Secondary | ICD-10-CM | POA: Diagnosis present

## 2014-03-12 DIAGNOSIS — E2839 Other primary ovarian failure: Secondary | ICD-10-CM | POA: Diagnosis not present

## 2014-03-12 LAB — HM DEXA SCAN: HM DEXA SCAN: NORMAL

## 2014-03-25 ENCOUNTER — Encounter: Payer: Self-pay | Admitting: General Practice

## 2014-03-29 ENCOUNTER — Ambulatory Visit (AMBULATORY_SURGERY_CENTER): Payer: Self-pay | Admitting: *Deleted

## 2014-03-29 VITALS — Ht 63.0 in | Wt 268.0 lb

## 2014-03-29 DIAGNOSIS — Z1211 Encounter for screening for malignant neoplasm of colon: Secondary | ICD-10-CM

## 2014-03-29 NOTE — Progress Notes (Signed)
Patient denies any allergies to eggs or soy. Patient denies any problems with anesthesia/sedation. Patient denies any oxygen use at home and does not take any diet/weight loss medications. EMMI education assisgned to patient on colonoscopy, this was explained and instructions given to patient. 

## 2014-04-06 ENCOUNTER — Encounter: Payer: Self-pay | Admitting: Family Medicine

## 2014-04-09 LAB — HM DIABETES EYE EXAM

## 2014-04-14 ENCOUNTER — Encounter: Payer: Self-pay | Admitting: Internal Medicine

## 2014-04-14 ENCOUNTER — Ambulatory Visit (AMBULATORY_SURGERY_CENTER): Payer: 59 | Admitting: Internal Medicine

## 2014-04-14 VITALS — BP 155/83 | HR 65 | Temp 96.8°F | Resp 20 | Ht 63.0 in | Wt 268.0 lb

## 2014-04-14 DIAGNOSIS — Z1211 Encounter for screening for malignant neoplasm of colon: Secondary | ICD-10-CM

## 2014-04-14 MED ORDER — SODIUM CHLORIDE 0.9 % IV SOLN: 500.0000 mL | INTRAVENOUS | Status: DC

## 2014-04-14 NOTE — Op Note (Signed)
[facility] [address] [place], [id number]   COLONOSCOPY PROCEDURE REPORT  PATIENT: Ashlee Williams, Ashlee Williams  MR#: [id number] BIRTHDATE: 01-15-1956 , 58  yrs. old GENDER: female ENDOSCOPIST: Iva Booparl E Gricel Copen, MD, [facility] PROCEDURE DATE:  04/14/2014 PROCEDURE:   Colonoscopy, screening First Screening Colonoscopy - Avg.  risk and is 50 yrs.  old or older Yes.  Prior Negative Screening - Now for repeat screening. [name]  History of Adenoma - Now for follow-up colonoscopy & has been > or = to 3 yrs.  [name]  Polyps Removed Today? No.  Polyps Removed Today? No.  Recommend repeat exam, <10 yrs? Polyps Removed Today? No.  Recommend repeat exam, <10 yrs? No. ASA CLASS:   Class III INDICATIONS:average risk for colorectal cancer and first colonoscopy. MEDICATIONS: Propofol 200 mg IV and Monitored anesthesia care  DESCRIPTION OF PROCEDURE:   After the risks benefits and alternatives of the procedure were thoroughly explained, informed consent was obtained.  The digital rectal exam revealed no abnormalities of the rectum.   The LB [id number]  endoscope was introduced through the anus and advanced to the cecum, which was identified by both the appendix and ileocecal valve. No adverse events experienced.   The quality of the prep was good, using MiraLax  The instrument was then slowly withdrawn as the colon was fully examined.      COLON FINDINGS: Williams normal appearing cecum, ileocecal valve, and appendiceal orifice were identified.  The ascending, transverse, descending, sigmoid colon, and rectum appeared unremarkable. Retroflexed views revealed no abnormalities. The time to cecum=2 minutes 41 seconds.  Withdrawal time=10 minutes 25 seconds.  The scope was withdrawn and the procedure completed. COMPLICATIONS: There were no immediate complications.  ENDOSCOPIC IMPRESSION: Normal colonoscopy - good prep - first screening  RECOMMENDATIONS: Repeat Colonscopy in 10  years.  eSigned:  Iva Booparl E Malichi Palardy, MD, [facility] 04/14/2014 10:13 AM   cc: Lezlie OctaveKate Tabori, MD and The Patient

## 2014-04-14 NOTE — Progress Notes (Signed)
Report to PACU, RN, vss, BBS= Clear.  

## 2014-04-14 NOTE — Patient Instructions (Addendum)
Your colonoscopy was normal with a good prep! Next routine colonoscopy in 10 years - 2026.  I appreciate the opportunity to care for you. Iva Booparl E. Gessner, MD, FACG     YOU HAD AN ENDOSCOPIC PROCEDURE TODAY AT THE [facility]: Refer to the procedure report that was given to you for any specific questions about what was found during the examination.  If the procedure report does not answer your questions, please call your gastroenterologist to clarify.  If you requested that your care partner not be given the details of your procedure findings, then the procedure report has been included in a sealed envelope for you to review at your convenience later.  YOU SHOULD EXPECT: Some feelings of bloating in the abdomen. Passage of more gas than usual.  Walking can help get rid of the air that was put into your GI tract during the procedure and reduce the bloating. If you had a lower endoscopy (such as a colonoscopy or flexible sigmoidoscopy) you may notice spotting of blood in your stool or on the toilet paper. If you underwent a bowel prep for your procedure, then you may not have a normal bowel movement for a few days.  DIET: Your first meal following the procedure should be a light meal and then it is ok to progress to your normal diet.  A half-sandwich or bowl of soup is an example of a good first meal.  Heavy or fried foods are harder to digest and may make you feel nauseous or bloated.  Likewise meals heavy in dairy and vegetables can cause extra gas to form and this can also increase the bloating.  Drink plenty of fluids but you should avoid alcoholic beverages for 24 hours.  ACTIVITY: Your care partner should take you home directly after the procedure.  You should plan to take it easy, moving slowly for the rest of the day.  You can resume normal activity the day after the procedure however you should NOT DRIVE or use heavy machinery for 24 hours (because of the sedation medicines  used during the test).    SYMPTOMS TO REPORT IMMEDIATELY: A gastroenterologist can be reached at any hour.  During normal business hours, 8:30 AM to 5:00 PM Monday through Friday, call [phone].  After hours and on weekends, please call the GI answering service at [phone] who will take a message and have the physician on call contact you.   Following lower endoscopy (colonoscopy or flexible sigmoidoscopy):  Excessive amounts of blood in the stool  Significant tenderness or worsening of abdominal pains  Swelling of the abdomen that is new, acute  Fever of 100F or higher   FOLLOW UP: If any biopsies were taken you will be contacted by phone or by letter within the next 1-3 weeks.  Call your gastroenterologist if you have not heard about the biopsies in 3 weeks.  Our staff will call the home number listed on your records the next business day following your procedure to check on you and address any questions or concerns that you may have at that time regarding the information given to you following your procedure. This is a courtesy call and so if there is no answer at the home number and we have not heard from you through the emergency physician on call, we will assume that you have returned to your regular daily activities without incident.  SIGNATURES/CONFIDENTIALITY: You and/or your care partner have signed paperwork which will be entered  into your electronic medical record.  These signatures attest to the fact that that the information above on your After Visit Summary has been reviewed and is understood.  Full responsibility of the confidentiality of this discharge information lies with you and/or your care-partner.    Resume medications.

## 2014-04-15 ENCOUNTER — Telehealth: Payer: Self-pay

## 2014-04-15 NOTE — Telephone Encounter (Signed)
No telephone was entered.  Did not say if could leave a message.  I called pt's cell [phone], answering machine picked up.  I did not leave a message, since it did not say I could. maw

## 2014-06-07 ENCOUNTER — Ambulatory Visit (INDEPENDENT_AMBULATORY_CARE_PROVIDER_SITE_OTHER): Payer: 59 | Admitting: Family Medicine

## 2014-06-07 ENCOUNTER — Encounter: Payer: Self-pay | Admitting: Family Medicine

## 2014-06-07 VITALS — BP 132/84 | HR 76 | Temp 98.3°F | Resp 16 | Wt 266.0 lb

## 2014-06-07 DIAGNOSIS — E559 Vitamin D deficiency, unspecified: Secondary | ICD-10-CM | POA: Insufficient documentation

## 2014-06-07 DIAGNOSIS — E119 Type 2 diabetes mellitus without complications: Secondary | ICD-10-CM

## 2014-06-07 LAB — BASIC METABOLIC PANEL
BUN: 18 mg/dL (ref 6–23)
CO2: 30 mEq/L (ref 19–32)
CREATININE: 0.88 mg/dL (ref 0.40–1.20)
Calcium: 9.7 mg/dL (ref 8.4–10.5)
Chloride: 107 mEq/L (ref 96–112)
GFR: 85.02 mL/min (ref >60.00)
Glucose, Bld: 91 mg/dL (ref 70–99)
Potassium: 3.9 mEq/L (ref 3.5–5.1)
Sodium: 142 mEq/L (ref 135–145)

## 2014-06-07 LAB — HEMOGLOBIN A1C: HEMOGLOBIN A1C: 6.5 % (ref 4.6–6.5)

## 2014-06-07 LAB — VITAMIN D 25 HYDROXY (VIT D DEFICIENCY, FRACTURES): VITD: 29.89 ng/mL — ABNORMAL LOW (ref 30.00–100.00)

## 2014-06-07 NOTE — Assessment & Plan Note (Signed)
Chronic problem.  Attempting to control w/ healthy diet and regular exercise.  UTD on eye exam.  On ARB for renal protection.  Check labs.  Adjust meds prn

## 2014-06-07 NOTE — Progress Notes (Signed)
Pre visit review using our clinic review tool, if applicable. No additional management support is needed unless otherwise documented below in the visit note. 

## 2014-06-07 NOTE — Progress Notes (Signed)
   Subjective:    Patient ID: Ashlee Williams, female    DOB: [birth year], 59 y.o.   MRN: [id number]  HPI DM- chronic problem, diet controlled.  UTD on eye exam.  On ARB for renal protection.  Down 2 lbs since last visit.  Walking regularly.  Eating low carb diet.  No CP, SOB, HAs, visual changes, edema, abd pain, N/V, numbness/tingling of hands/feet.   Review of Systems For ROS see HPI     Objective:   Physical Exam  Constitutional: She is oriented to person, place, and time. She appears well-developed and well-nourished. No distress.  HENT:  Head: Normocephalic and atraumatic.  Eyes: Conjunctivae and EOM are normal. Pupils are equal, round, and reactive to light.  Neck: Normal range of motion. Neck supple. No thyromegaly present.  Cardiovascular: Normal rate, regular rhythm, normal heart sounds and intact distal pulses.   No murmur heard. Pulmonary/Chest: Effort normal and breath sounds normal. No respiratory distress.  Abdominal: Soft. She exhibits no distension. There is no tenderness.  Musculoskeletal: She exhibits no edema.  Lymphadenopathy:    She has no cervical adenopathy.  Neurological: She is alert and oriented to person, place, and time.  Skin: Skin is warm and dry.  Psychiatric: She has a normal mood and affect. Her behavior is normal.  Vitals reviewed.         Assessment & Plan:

## 2014-06-07 NOTE — Patient Instructions (Signed)
Follow up in 3-4 months to recheck diabetes We'll notify you of your lab results and make any changes if needed Keep up the good work on healthy diet and regular exercise- you can do it! Call with any questions or concerns Happy Spring!!!

## 2014-06-08 ENCOUNTER — Encounter: Payer: Self-pay | Admitting: General Practice

## 2014-08-06 ENCOUNTER — Ambulatory Visit (INDEPENDENT_AMBULATORY_CARE_PROVIDER_SITE_OTHER): Payer: 59 | Admitting: Family Medicine

## 2014-08-06 ENCOUNTER — Encounter: Payer: Self-pay | Admitting: Family Medicine

## 2014-08-06 VITALS — BP 140/80 | HR 80 | Temp 98.7°F | Resp 17 | Wt 272.2 lb

## 2014-08-06 DIAGNOSIS — J209 Acute bronchitis, unspecified: Secondary | ICD-10-CM | POA: Diagnosis not present

## 2014-08-06 MED ORDER — AZITHROMYCIN 250 MG PO TABS: ORAL_TABLET | ORAL | Status: DC

## 2014-08-06 MED ORDER — ALBUTEROL SULFATE (2.5 MG/3ML) 0.083% IN NEBU
2.5000 mg | INHALATION_SOLUTION | Freq: Once | RESPIRATORY_TRACT | Status: AC
  Administered 2014-08-06: 2.5 mg via RESPIRATORY_TRACT

## 2014-08-06 MED ORDER — ALBUTEROL SULFATE HFA 108 (90 BASE) MCG/ACT IN AERS: 2.0000 | INHALATION_SPRAY | RESPIRATORY_TRACT | Status: DC | PRN

## 2014-08-06 MED ORDER — PROMETHAZINE-DM 6.25-15 MG/5ML PO SYRP: 5.0000 mL | ORAL_SOLUTION | Freq: Four times a day (QID) | ORAL | Status: DC | PRN

## 2014-08-06 NOTE — Patient Instructions (Signed)
Follow up as scheduled- sooner if needed Start the Zpack as directed Use the cough syrup for nights/weekends- will cause drowsiness Mucinex DM for daytime cough Use the inhaler- 2 puffs every 4-6 hrs as needed for cough, shortness of breath, wheezing Drink plenty of fluids REST! Stop smoking! Call with any questions or concerns Hang in there!

## 2014-08-06 NOTE — Progress Notes (Signed)
Pre visit review using our clinic review tool, if applicable. No additional management support is needed unless otherwise documented below in the visit note. 

## 2014-08-06 NOTE — Assessment & Plan Note (Signed)
Pt's sxs and PE consistent w/ acute asthmatic bronchitis vs COPD exacerbation due to tobacco abuse.  Shortness of breath and cough improved s/p neb tx.  Start Zpack.  Albuterol inhaler prn.  Goal is to avoid prednisone if able due to DM.  Cough meds.  Stressed need for smoking cessation.  Reviewed supportive care and red flags that should prompt return.  Pt expressed understanding and is in agreement w/ plan.

## 2014-08-06 NOTE — Progress Notes (Signed)
   Subjective:    Patient ID: Ashlee Williams A Sharron, female    DOB: February 09, 1956, 59 y.o.   MRN: [id number]  HPI URI- pt has hx of asthmatic bronchitis and this feels similar.  sxs started 5 days ago.  Cough is junky but not productive.  No fevers.  No sinus pain/pressure.  + wheezing.  + SOB.  + smoker.  No known sick contacts.  Not currently using inhalers.  No ear pain.  + sore throat from coughing.   Review of Systems For ROS see HPI     Objective:   Physical Exam  Constitutional: She is oriented to person, place, and time. She appears well-developed and well-nourished. No distress.  HENT:  Head: Normocephalic and atraumatic.  TMs normal bilaterally Mild nasal congestion Throat w/out erythema, edema, or exudate  Eyes: Conjunctivae and EOM are normal. Pupils are equal, round, and reactive to light.  Neck: Normal range of motion. Neck supple.  Cardiovascular: Normal rate, regular rhythm, normal heart sounds and intact distal pulses.   No murmur heard. Pulmonary/Chest: Effort normal. No respiratory distress. She has wheezes (diffuse wheezes w/ poor air movement.  wheezing and air movement improved s/p neb tx in office).  + hacking cough- improved s/p neb tx in office  Lymphadenopathy:    She has no cervical adenopathy.  Neurological: She is alert and oriented to person, place, and time.  Skin: Skin is warm and dry.  Psychiatric: She has a normal mood and affect. Her behavior is normal. Thought content normal.  Vitals reviewed.         Assessment & Plan:

## 2014-08-24 ENCOUNTER — Encounter: Payer: Self-pay | Admitting: Medical

## 2014-08-24 ENCOUNTER — Ambulatory Visit (INDEPENDENT_AMBULATORY_CARE_PROVIDER_SITE_OTHER): Payer: 59 | Admitting: Medical

## 2014-08-24 ENCOUNTER — Encounter: Payer: Self-pay | Admitting: Family Medicine

## 2014-08-24 ENCOUNTER — Telehealth: Payer: Self-pay | Admitting: Medical

## 2014-08-24 VITALS — BP 160/90 | HR 80 | Temp 98.3°F | Ht 63.0 in | Wt 266.6 lb

## 2014-08-24 DIAGNOSIS — J029 Acute pharyngitis, unspecified: Secondary | ICD-10-CM

## 2014-08-24 DIAGNOSIS — I10 Essential (primary) hypertension: Secondary | ICD-10-CM | POA: Diagnosis not present

## 2014-08-24 DIAGNOSIS — J040 Acute laryngitis: Secondary | ICD-10-CM | POA: Diagnosis not present

## 2014-08-24 LAB — POCT RAPID STREP A (OFFICE): RAPID STREP A SCREEN: NEGATIVE

## 2014-08-24 MED ORDER — AZITHROMYCIN 250 MG PO TABS: ORAL_TABLET | ORAL | Status: DC

## 2014-08-24 MED ORDER — AMLODIPINE BESYLATE 2.5 MG PO TABS: 2.5000 mg | ORAL_TABLET | Freq: Every day | ORAL | Status: DC

## 2014-08-24 NOTE — Patient Instructions (Signed)
With your laryngitis, I want you to rest your voice and return to work on Friday. If hoarse voice persist by 3 wks would refer to ENT for view of larynx.  Your throat looked faint red but strep test was negative.  By exam left ear looked moderate dull and with productive cough will rx azithromycin. Cover you for early OM and early bronchitis.  Start to check your bp at work. If your bp on average is over 140/90 then I would add low dose amlodipine 2.5 mg a day.  Folllow up in 2 wks or as needed

## 2014-08-24 NOTE — Assessment & Plan Note (Signed)
  Start to check your bp at work. If your bp on average is over 140/90 then I would add low dose amlodipine 2.5 mg a day.

## 2014-08-24 NOTE — Progress Notes (Signed)
Pre visit review using our clinic review tool, if applicable. No additional management support is needed unless otherwise documented below in the visit note. 

## 2014-08-24 NOTE — Assessment & Plan Note (Signed)
With your laryngitis, I want you to rest your voice and return to work on Friday. If hoarse voice persist by 3 wks would refer to ENT for view of larynx

## 2014-08-24 NOTE — Telephone Encounter (Signed)
I have been trying to print pt work note. Note to lpn so she can try. Then I will sign. Then send/fax to [facility].

## 2014-08-24 NOTE — Progress Notes (Signed)
Subjective:    Patient ID: Ashlee GriffinsStanley A Allard, female    DOB: [birth year], 59 y.o.   MRN: [id number]  HPI  Pt in with some hoarse voice this am. Pt on voice all day. She is a Systems developerscheduler. Pt does smoke. 1/2 a pack a day. About 7 years. Pt denies any sore throat. No fever, no chills or body aches.  Pt had some bronchitis about 2 wks ago. She took zpack and felt better. Then just recently last 24 hours mild occasional productive cough. Mild production this am.    Review of Systems  Constitutional: Negative for fever, chills and fatigue.  HENT: Positive for congestion and voice change. Negative for ear pain, postnasal drip, rhinorrhea, sinus pressure, sneezing, sore throat, tinnitus and trouble swallowing.   Respiratory: Positive for cough. Negative for chest tightness, shortness of breath and wheezing.   Cardiovascular: Negative for chest pain and palpitations.  Gastrointestinal: Negative.   Musculoskeletal: Negative for back pain.  Skin: Negative for rash.  Neurological: Negative.   Psychiatric/Behavioral: Negative for behavioral problems and confusion.   Past Medical History  Diagnosis Date  . Asthma   . Allergy   . Hypertension   . Blood in stool   . Chronic headaches   . Chicken pox   . Bronchitis   . Diabetes mellitus     PT. IS TRYING TO CONTROL WITH DIET BUT AS NOTED WEIGHT IS UP  . Tobacco abuse   . Obesity     History   Social History  . Marital Status: Single    Spouse Name: N/A  . Number of Children: N/A  . Years of Education: N/A   Occupational History  . Not on file.   Social History Main Topics  . Smoking status: Current Every Day Smoker -- 0.50 packs/day for 10 years    Types: Cigarettes  . Smokeless tobacco: Never Used  . Alcohol Use: Yes     Comment: occasionally  . Drug Use: No  . Sexual Activity: Yes   Other Topics Concern  . Not on file   Social History Narrative    Past Surgical History  Procedure Laterality Date  . Abdominal  hysterectomy  1998    Family History  Problem Relation Age of Onset  . Hypertension Mother   . Diabetes Sister   . Hypertension Sister   . Cancer Maternal Aunt   . Hypertension Maternal Aunt   . Stroke Maternal Grandmother   . Hypertension Maternal Grandmother   . Colon cancer Maternal Grandmother 585  . Cancer Maternal Grandfather   . Hypertension Maternal Grandfather   . Hypertension Father   . Hypertension Brother   . Hypertension Maternal Uncle   . Hypertension Paternal Aunt   . Hypertension Paternal Uncle   . Hypertension Paternal Grandmother   . Hypertension Paternal Grandfather     No Known Allergies  Current Outpatient Prescriptions on File Prior to Visit  Medication Sig Dispense Refill  . albuterol (PROAIR HFA) 108 (90 BASE) MCG/ACT inhaler Inhale 2 puffs into the lungs every 4 (four) hours as needed for wheezing or shortness of breath. 1 Inhaler 6  . APAP-Isometheptene-Dichloral (MIDRIN) 325-65-100 MG CAPS Take 1 capsule by mouth every 6 (six) hours as needed.     . cetirizine (ZYRTEC) 10 MG tablet Take 10 mg by mouth daily.    . promethazine-dextromethorphan (PROMETHAZINE-DM) 6.25-15 MG/5ML syrup Take 5 mLs by mouth 4 (four) times daily as needed. 240 mL 0  . valsartan-hydrochlorothiazide (DIOVAN HCT) 160-12.5  MG per tablet Take 1 tablet by mouth daily. 30 tablet 11   No current facility-administered medications on file prior to visit.    BP 160/90 mmHg  Pulse 80  Temp(Src) 98.3 F (36.8 C) (Oral)  Ht 5\' 3"  (1.6 m)  Wt 266 lb 9.6 oz (120.929 kg)  BMI 47.24 kg/m2  SpO2 100%  LMP 11/10/2010       Objective:   Physical Exam  General  Mental Status - Alert. General Appearance - Well groomed. Not in acute distress.  Skin Rashes- No Rashes.  HEENT Head- Normal. Ear Auditory Canal - Left- Normal. Right - Normal.Tympanic Membrane- Left- moderate dull . Right- Normal. Eye Sclera/Conjunctiva- Left- Normal. Right- Normal. Nose & Sinuses Nasal Mucosa-  Left-  Not boggy or Congested. Right-  Not  boggy or Congested. Mouth & Throat Lips: Upper Lip- Normal: no dryness, cracking, pallor, cyanosis, or vesicular eruption. Lower Lip-Normal: no dryness, cracking, pallor, cyanosis or vesicular eruption. Buccal Mucosa- Bilateral- No Aphthous ulcers. Oropharynx- No Discharge or Erythema. Tonsils: Characteristics- Bilateral- faint  Erythema but no  Congestion. Size/Enlargement- Bilateral- No enlargement. Discharge- bilateral-None.  Neck Neck- Supple. No Masses.  No lymphandenopathy.   Chest and Lung Exam Auscultation: Breath Sounds:- even and unlabored,  Cardiovascular Auscultation:Rythm- Regular, rate and rhythm. Murmurs & Other Heart Sounds:Ausculatation of the heart reveal- No Murmurs.  Lymphatic Head & Neck General Head & Neck Lymphatics: Bilateral: Description- No Localized lymphadenopathy.      Neurologic Cranial Nerve exam:- CN III-XII intact(No nystagmus), symmetric smile. Strength:- 5/5 equal and symmetric strength both upper and lower extremities.       Assessment & Plan:

## 2014-08-26 NOTE — Telephone Encounter (Signed)
Letter printed for signature

## 2014-10-06 ENCOUNTER — Ambulatory Visit: Payer: 59 | Admitting: Family Medicine

## 2014-10-06 ENCOUNTER — Encounter: Payer: Self-pay | Admitting: Family Medicine

## 2014-10-06 ENCOUNTER — Ambulatory Visit (INDEPENDENT_AMBULATORY_CARE_PROVIDER_SITE_OTHER): Payer: 59 | Admitting: Family Medicine

## 2014-10-06 VITALS — BP 140/84 | HR 77 | Temp 98.3°F | Resp 16 | Ht 63.0 in | Wt 269.5 lb

## 2014-10-06 DIAGNOSIS — I10 Essential (primary) hypertension: Secondary | ICD-10-CM | POA: Diagnosis not present

## 2014-10-06 DIAGNOSIS — E119 Type 2 diabetes mellitus without complications: Secondary | ICD-10-CM

## 2014-10-06 MED ORDER — VALSARTAN-HYDROCHLOROTHIAZIDE 160-12.5 MG PO TABS: 1.0000 | ORAL_TABLET | Freq: Every day | ORAL | Status: DC

## 2014-10-06 MED ORDER — AMLODIPINE BESYLATE 2.5 MG PO TABS: 2.5000 mg | ORAL_TABLET | Freq: Every day | ORAL | Status: DC

## 2014-10-06 NOTE — Progress Notes (Signed)
Pre visit review using our clinic review tool, if applicable. No additional management support is needed unless otherwise documented below in the visit note. 

## 2014-10-06 NOTE — Patient Instructions (Signed)
Schedule your complete physical after 10/28 We'll notify you of your lab results and make any changes if needed Keep up the good work on healthy diet and regular exercise- I'm so proud of you!!! For the hot flashes, try Black cohosh.  If this doesn't work, try [employer] Call with any questions or concerns Happy 4th of July!!!

## 2014-10-06 NOTE — Progress Notes (Signed)
   Subjective:    Patient ID: Ashlee Williams, female    DOB: 1956-01-22, 59 y.o.   MRN: [id number]  HPI DM- chronic problem, currently diet controlled.  UTD on eye and foot exam.  On ARB for renal protection.  Pt is following a low carb diet.  Has started walking daily.  HTN- chronic problem, on Valsartan HCTZ and amlodipine.  Better control than previous.  No CP, SOB, visual changes, edema.  + HAs.  Review of Systems For ROS see HPI     Objective:   Physical Exam  Constitutional: She is oriented to person, place, and time. She appears well-developed and well-nourished. No distress.  HENT:  Head: Normocephalic and atraumatic.  Eyes: Conjunctivae and EOM are normal. Pupils are equal, round, and reactive to light.  Neck: Normal range of motion. Neck supple. No thyromegaly present.  Cardiovascular: Normal rate, regular rhythm, normal heart sounds and intact distal pulses.   No murmur heard. Pulmonary/Chest: Effort normal and breath sounds normal. No respiratory distress.  Abdominal: Soft. She exhibits no distension. There is no tenderness.  Musculoskeletal: She exhibits no edema.  Lymphadenopathy:    She has no cervical adenopathy.  Neurological: She is alert and oriented to person, place, and time.  Skin: Skin is warm and dry.  Psychiatric: She has a normal mood and affect. Her behavior is normal.  Vitals reviewed.         Assessment & Plan:

## 2014-10-06 NOTE — Assessment & Plan Note (Signed)
Chronic problem.  Attempting to control w/ diet and exercise.  UTD on eye exam, foot exam.  On ARB for renal protection.  Check labs.  Start meds prn.

## 2014-10-06 NOTE — Assessment & Plan Note (Signed)
Chronic problem.  Adequate but not ideal control.  Amlodipine was added at last visit.  BP is better than previous.  Stressed need for smoking cessation.  Will continue to follow.

## 2014-10-07 ENCOUNTER — Encounter: Payer: Self-pay | Admitting: General Practice

## 2014-10-07 LAB — HEPATIC FUNCTION PANEL
ALBUMIN: 3.6 g/dL (ref 3.5–5.2)
ALT: 17 U/L (ref 0–35)
AST: 17 U/L (ref 0–37)
Alkaline Phosphatase: 61 U/L (ref 39–117)
Bilirubin, Direct: 0.1 mg/dL (ref 0.0–0.3)
Total Bilirubin: 0.3 mg/dL (ref 0.2–1.2)
Total Protein: 7.4 g/dL (ref 6.0–8.3)

## 2014-10-07 LAB — CBC WITH DIFFERENTIAL/PLATELET
Basophils Absolute: 0.1 10*3/uL (ref 0.0–0.1)
Basophils Relative: 1.6 % (ref 0.0–3.0)
Eosinophils Absolute: 0.3 10*3/uL (ref 0.0–0.7)
Eosinophils Relative: 3.6 % (ref 0.0–5.0)
HEMATOCRIT: 39 % (ref 36.0–46.0)
Hemoglobin: 12.8 g/dL (ref 12.0–15.0)
LYMPHS PCT: 35.3 % (ref 12.0–46.0)
Lymphs Abs: 2.6 10*3/uL (ref 0.7–4.0)
MCHC: 32.8 g/dL (ref 30.0–36.0)
MCV: 82 fl (ref 78.0–100.0)
MONO ABS: 0.4 10*3/uL (ref 0.1–1.0)
Monocytes Relative: 5.7 % (ref 3.0–12.0)
NEUTROS PCT: 53.8 % (ref 43.0–77.0)
Neutro Abs: 3.9 10*3/uL (ref 1.4–7.7)
Platelets: 206 10*3/uL (ref 150.0–400.0)
RBC: 4.75 Mil/uL (ref 3.87–5.11)
RDW: 15.2 % (ref 11.5–15.5)
WBC: 7.3 10*3/uL (ref 4.0–10.5)

## 2014-10-07 LAB — BASIC METABOLIC PANEL
BUN: 16 mg/dL (ref 6–23)
CO2: 30 mEq/L (ref 19–32)
Calcium: 9.1 mg/dL (ref 8.4–10.5)
Chloride: 104 mEq/L (ref 96–112)
Creatinine, Ser: 0.87 mg/dL (ref 0.40–1.20)
GFR: 86.05 mL/min (ref >60.00)
Glucose, Bld: 89 mg/dL (ref 70–99)
Potassium: 3.9 mEq/L (ref 3.5–5.1)
Sodium: 140 mEq/L (ref 135–145)

## 2014-10-07 LAB — LIPID PANEL
CHOL/HDL RATIO: 3
CHOLESTEROL: 113 mg/dL (ref 0–200)
HDL: 34.7 mg/dL — ABNORMAL LOW (ref >39.00)
LDL CALC: 58 mg/dL (ref 0–99)
NonHDL: 78.3
TRIGLYCERIDES: 102 mg/dL (ref 0.0–149.0)
VLDL: 20.4 mg/dL (ref 0.0–40.0)

## 2014-10-07 LAB — HEMOGLOBIN A1C: Hgb A1c MFr Bld: 6.1 % (ref 4.6–6.5)

## 2014-10-07 LAB — TSH: TSH: 0.98 u[IU]/mL (ref 0.35–4.50)

## 2014-11-16 ENCOUNTER — Emergency Department (HOSPITAL_COMMUNITY): Payer: 59

## 2014-11-16 ENCOUNTER — Telehealth: Payer: Self-pay | Admitting: Family Medicine

## 2014-11-16 ENCOUNTER — Emergency Department (HOSPITAL_COMMUNITY)
Admission: EM | Admit: 2014-11-16 | Discharge: 2014-11-16 | Disposition: A | Discharge status: Home / Self Care | Payer: 59 | Attending: Emergency Medicine | Admitting: Emergency Medicine

## 2014-11-16 ENCOUNTER — Encounter (HOSPITAL_COMMUNITY): Payer: Self-pay | Admitting: *Deleted

## 2014-11-16 DIAGNOSIS — E669 Obesity, unspecified: Secondary | ICD-10-CM | POA: Diagnosis not present

## 2014-11-16 DIAGNOSIS — E119 Type 2 diabetes mellitus without complications: Secondary | ICD-10-CM | POA: Diagnosis not present

## 2014-11-16 DIAGNOSIS — R1011 Right upper quadrant pain: Secondary | ICD-10-CM | POA: Diagnosis present

## 2014-11-16 DIAGNOSIS — K8000 Calculus of gallbladder with acute cholecystitis without obstruction: Secondary | ICD-10-CM | POA: Insufficient documentation

## 2014-11-16 DIAGNOSIS — I714 Abdominal aortic aneurysm, without rupture, unspecified: Secondary | ICD-10-CM

## 2014-11-16 DIAGNOSIS — I10 Essential (primary) hypertension: Secondary | ICD-10-CM | POA: Insufficient documentation

## 2014-11-16 DIAGNOSIS — Z79899 Other long term (current) drug therapy: Secondary | ICD-10-CM | POA: Diagnosis not present

## 2014-11-16 DIAGNOSIS — Z8619 Personal history of other infectious and parasitic diseases: Secondary | ICD-10-CM | POA: Diagnosis not present

## 2014-11-16 DIAGNOSIS — Z720 Tobacco use: Secondary | ICD-10-CM | POA: Diagnosis not present

## 2014-11-16 DIAGNOSIS — R112 Nausea with vomiting, unspecified: Secondary | ICD-10-CM

## 2014-11-16 DIAGNOSIS — J45909 Unspecified asthma, uncomplicated: Secondary | ICD-10-CM | POA: Diagnosis not present

## 2014-11-16 LAB — URINE MICROSCOPIC-ADD ON

## 2014-11-16 LAB — CBC WITH DIFFERENTIAL/PLATELET
Basophils Absolute: 0 10*3/uL (ref 0.0–0.1)
Basophils Relative: 0 % (ref 0–1)
EOS ABS: 0 10*3/uL (ref 0.0–0.7)
Eosinophils Relative: 0 % (ref 0–5)
HCT: 44.1 % (ref 36.0–46.0)
HEMOGLOBIN: 14 g/dL (ref 12.0–15.0)
LYMPHS ABS: 1.1 10*3/uL (ref 0.7–4.0)
LYMPHS PCT: 12 % (ref 12–46)
MCH: 26.9 pg (ref 26.0–34.0)
MCHC: 31.7 g/dL (ref 30.0–36.0)
MCV: 84.8 fL (ref 78.0–100.0)
Monocytes Absolute: 0.3 10*3/uL (ref 0.1–1.0)
Monocytes Relative: 3 % (ref 3–12)
NEUTROS ABS: 7.5 10*3/uL (ref 1.7–7.7)
NEUTROS PCT: 85 % — AB (ref 43–77)
Platelets: 229 10*3/uL (ref 150–400)
RBC: 5.2 MIL/uL — AB (ref 3.87–5.11)
RDW: 15.1 % (ref 11.5–15.5)
WBC: 8.9 10*3/uL (ref 4.0–10.5)

## 2014-11-16 LAB — COMPREHENSIVE METABOLIC PANEL
ALT: 18 U/L (ref 14–54)
ANION GAP: 12 (ref 5–15)
AST: 23 U/L (ref 15–41)
Albumin: 4 g/dL (ref 3.5–5.0)
Alkaline Phosphatase: 64 U/L (ref 38–126)
BUN: 12 mg/dL (ref 6–20)
CHLORIDE: 105 mmol/L (ref 101–111)
CO2: 26 mmol/L (ref 22–32)
Calcium: 9.1 mg/dL (ref 8.9–10.3)
Creatinine, Ser: 0.84 mg/dL (ref 0.44–1.00)
GFR calc non Af Amer: >60 mL/min (ref >60)
GLUCOSE: 151 mg/dL — AB (ref 65–99)
Potassium: 3.7 mmol/L (ref 3.5–5.1)
Sodium: 143 mmol/L (ref 135–145)
TOTAL PROTEIN: 8.3 g/dL — AB (ref 6.5–8.1)
Total Bilirubin: 0.4 mg/dL (ref 0.3–1.2)

## 2014-11-16 LAB — LIPASE, BLOOD: LIPASE: 13 U/L — AB (ref 22–51)

## 2014-11-16 LAB — URINALYSIS, ROUTINE W REFLEX MICROSCOPIC
Bilirubin Urine: NEGATIVE
Glucose, UA: NEGATIVE mg/dL
KETONES UR: NEGATIVE mg/dL
Leukocytes, UA: NEGATIVE
NITRITE: NEGATIVE
PROTEIN: NEGATIVE mg/dL
Specific Gravity, Urine: 1.015 (ref 1.005–1.030)
Urobilinogen, UA: 0.2 mg/dL (ref 0.0–1.0)
pH: 6.5 (ref 5.0–8.0)

## 2014-11-16 LAB — TROPONIN I: Troponin I: <0.03 ng/mL (ref <0.031)

## 2014-11-16 MED ORDER — MORPHINE SULFATE 4 MG/ML IJ SOLN
4.0000 mg | Freq: Once | INTRAMUSCULAR | Status: AC
Start: 2014-11-16 — End: 2014-11-16
  Administered 2014-11-16: 4 mg via INTRAVENOUS
  Filled 2014-11-16: qty 1

## 2014-11-16 MED ORDER — ONDANSETRON HCL 4 MG/2ML IJ SOLN
4.0000 mg | Freq: Once | INTRAMUSCULAR | Status: AC
  Administered 2014-11-16: 4 mg via INTRAVENOUS
  Filled 2014-11-16: qty 2

## 2014-11-16 MED ORDER — ONDANSETRON 4 MG PO TBDP: 4.0000 mg | ORAL_TABLET | Freq: Three times a day (TID) | ORAL | Status: DC | PRN

## 2014-11-16 MED ORDER — HYDROCODONE-ACETAMINOPHEN 5-325 MG PO TABS: 1.0000 | ORAL_TABLET | Freq: Four times a day (QID) | ORAL | Status: DC | PRN

## 2014-11-16 NOTE — ED Notes (Signed)
Patient is made aware that an urine sample is needed. Patient is encouraged to void when able. 

## 2014-11-16 NOTE — ED Notes (Signed)
Patient c/o abd pain with n/v, denies diarrhea, unsure of fevers.

## 2014-11-16 NOTE — Telephone Encounter (Signed)
11/18/14 11:00am - hospital f/u, [facility], gall stones & abdominal aortic aneurism

## 2014-11-16 NOTE — ED Notes (Signed)
Patient actively vomiting at this time. 

## 2014-11-16 NOTE — ED Provider Notes (Signed)
CSN: [id number]     Arrival date & time 11/16/14  3086 History   First MD Initiated Contact with Patient 11/16/14 [phone]     Chief Complaint  Patient presents with  . Abdominal Pain     (Consider location/radiation/quality/duration/timing/severity/associated sxs/prior Treatment) HPI Comments: Pt comes in with c/o epigastric abdominal pain, chills  and vomiting that started this morning. No fever or diarrhea. No history of similar symptoms. Denies cp or sob. She states that she hasn't been able to keep anything down this morning and she had had multiple episodes of vomiting. Denies being diabetic  The history is provided by the patient. No language interpreter was used.    Past Medical History  Diagnosis Date  . Asthma   . Allergy   . Hypertension   . Blood in stool   . Chronic headaches   . Chicken pox   . Bronchitis   . Diabetes mellitus     PT. IS TRYING TO CONTROL WITH DIET BUT AS NOTED WEIGHT IS UP  . Tobacco abuse   . Obesity    Past Surgical History  Procedure Laterality Date  . Abdominal hysterectomy  1998   Family History  Problem Relation Age of Onset  . Hypertension Mother   . Diabetes Sister   . Hypertension Sister   . Cancer Maternal Aunt   . Hypertension Maternal Aunt   . Stroke Maternal Grandmother   . Hypertension Maternal Grandmother   . Colon cancer Maternal Grandmother 83  . Cancer Maternal Grandfather   . Hypertension Maternal Grandfather   . Hypertension Father   . Hypertension Brother   . Hypertension Maternal Uncle   . Hypertension Paternal Aunt   . Hypertension Paternal Uncle   . Hypertension Paternal Grandmother   . Hypertension Paternal Grandfather    History  Substance Use Topics  . Smoking status: Current Every Day Smoker -- 0.50 packs/day for 10 years    Types: Cigarettes  . Smokeless tobacco: Never Used  . Alcohol Use: Yes     Comment: occasionally   OB History    No data available     Review of Systems  All other systems  reviewed and are negative.     Allergies  Review of patient's allergies indicates no known allergies.  Home Medications   Prior to Admission medications   Medication Sig Start Date End Date Taking? Authorizing Provider  albuterol (PROAIR HFA) 108 (90 BASE) MCG/ACT inhaler Inhale 2 puffs into the lungs every 4 (four) hours as needed for wheezing or shortness of breath. 08/06/14   Sheliah Hatch, MD  amLODipine (NORVASC) 2.5 MG tablet Take 1 tablet (2.5 mg total) by mouth daily. 10/06/14   Sheliah Hatch, MD  APAP-Isometheptene-Dichloral (MIDRIN) [phone] MG CAPS Take 1 capsule by mouth every 6 (six) hours as needed.     Historical Provider, MD  cetirizine (ZYRTEC) 10 MG tablet Take 10 mg by mouth daily.    Historical Provider, MD  valsartan-hydrochlorothiazide (DIOVAN HCT) 160-12.5 MG per tablet Take 1 tablet by mouth daily. 10/06/14 10/06/15  Sheliah Hatch, MD   BP 171/100 mmHg  Pulse 65  Temp(Src) 97.3 F (36.3 C) (Oral)  Resp 16  SpO2 100%  LMP 11/10/2010 Physical Exam  Constitutional: She is oriented to person, place, and time. She appears well-developed and well-nourished.  HENT:  Head: Normocephalic and atraumatic.  Cardiovascular: Normal rate.   Pulmonary/Chest: Effort normal and breath sounds normal.  Abdominal: Soft. Bowel sounds are normal. There is  tenderness in the right upper quadrant and epigastric area.  Musculoskeletal: Normal range of motion.  Neurological: She is alert and oriented to person, place, and time.  Skin: Skin is warm and dry.  Psychiatric: She has a normal mood and affect.  Nursing note and vitals reviewed.   ED Course  Procedures (including critical care time) Labs Review Labs Reviewed  CBC WITH DIFFERENTIAL/PLATELET - Abnormal; Notable for the following:    RBC 5.20 (*)    Neutrophils Relative % 85 (*)    All other components within normal limits  COMPREHENSIVE METABOLIC PANEL - Abnormal; Notable for the following:    Glucose,  Bld 151 (*)    Total Protein 8.3 (*)    All other components within normal limits  LIPASE, BLOOD - Abnormal; Notable for the following:    Lipase 13 (*)    All other components within normal limits  URINALYSIS, ROUTINE W REFLEX MICROSCOPIC (NOT AT [facility]) - Abnormal; Notable for the following:    Hgb urine dipstick TRACE (*)    All other components within normal limits  TROPONIN I  URINE MICROSCOPIC-ADD ON    Imaging Review US Abdomen Complete  11/16/2014   CLINICAL DATA:  Right upper quadrant tenderness.  Nausea for 1 day.  EXAM: ULTRASOUND ABDOMEN COMPLETE  COMPARISON:  None.  FINDINGS: Gallbladder: Cholelithiasis without gallbladder wall thickening or pericholecystic fluid. Negative sonographic Murphy sign. Gallbladder sludge.  Common bile duct: Diameter: 4.6 mm  Liver: No focal hepatic lesion. Mild nodular contour of the liver. Normal hepatic echogenicity.  IVC: No abnormality visualized.  Pancreas: Visualized portion unremarkable. Pancreatic tail is obscured by overlying bowel gas.  Spleen: Limited visualization secondary to overlying bowel gas.  Right Kidney: Length: 11.5 cm. Echogenicity within normal limits. No mass or hydronephrosis visualized.  Left Kidney: Length: 11.1 cm. Echogenicity within normal limits. No mass or hydronephrosis visualized.  Abdominal aorta: Visualize abdominal aorta approximately measures 3.3 cm in AP diameter.  Other findings: None.  IMPRESSION: 1. Cholelithiasis without sonographic evidence of acute cholecystitis. 2. Mild abdominal aortic aneurysm measuring 3.3 cm in AP diameter. Recommend followup by ultrasound in 3 years. This recommendation follows ACR consensus guidelines: White Paper of the ACR Incidental Findings Committee II on Vascular Findings. Earlyne Iba Radiol 2013; 16:109-604   Electronically Signed   By: Elige Ko   On: 11/16/2014 11:19     EKG Interpretation   Date/Time:  Tuesday November 16 2014 10:00:38 EDT Ventricular Rate:  69 PR Interval:   164 QRS Duration: 83 QT Interval:  425 QTC Calculation: 455 R Axis:   22 Text Interpretation:  Sinus arrhythmia Left ventricular hypertrophy  Borderline T abnormalities, anterior leads Confirmed by Adriana Simas  MD, BRIAN  ([id number]) on 11/16/2014 10:41:16 AM      MDM   Final diagnoses:  Calculus of gallbladder with acute cholecystitis without obstruction  Aortic aneurysm, abdominal  Nausea and vomiting, vomiting of unspecified type    Pt is feeling better and tolerating po at this time. Discussed aneursym with pt. Will send home with zofran and hydrocodone. Pt has pcp to follow up with    Teressa Lower, NP 11/16/14 1225  Donnetta Hutching, MD 11/18/14 1249

## 2014-11-16 NOTE — ED Notes (Signed)
Patient given a ginger ale and saltine crackers to drink/eat for fluid/PO challenge.

## 2014-11-16 NOTE — Discharge Instructions (Signed)
Abdominal Aortic Aneurysm An aneurysm is a weakened or damaged part of an artery wall that bulges from the normal force of blood pumping through the body. An abdominal aortic aneurysm is an aneurysm that occurs in the lower part of the aorta, the main artery of the body.  The major concern with an abdominal aortic aneurysm is that it can enlarge and burst (rupture) or blood can flow between the layers of the wall of the aorta through a tear (aorticdissection). Both of these conditions can cause bleeding inside the body and can be life threatening unless diagnosed and treated promptly. CAUSES  The exact cause of an abdominal aortic aneurysm is unknown. Some contributing factors are:   A hardening of the arteries caused by the buildup of fat and other substances in the lining of a blood vessel (arteriosclerosis).  Inflammation of the walls of an artery (arteritis).   Connective tissue diseases, such as Marfan syndrome.   Abdominal trauma.   An infection, such as syphilis or staphylococcus, in the wall of the aorta (infectious aortitis) caused by bacteria. RISK FACTORS  Risk factors that contribute to an abdominal aortic aneurysm may include:  Age older than 60 years.   High blood pressure (hypertension).  Female gender.  Ethnicity (white race).  Obesity.  Family history of aneurysm (first degree relatives only).  Tobacco use. PREVENTION  The following healthy lifestyle habits may help decrease your risk of abdominal aortic aneurysm:  Quitting smoking. Smoking can raise your blood pressure and cause arteriosclerosis.  Limiting or avoiding alcohol.  Keeping your blood pressure, blood sugar level, and cholesterol levels within normal limits.  Decreasing your salt intake. In somepeople, too much salt can raise blood pressure and increase your risk of abdominal aortic aneurysm.  Eating a diet low in saturated fats and cholesterol.  Increasing your fiber intake by including  whole grains, vegetables, and fruits in your diet. Eating these foods may help lower blood pressure.  Maintaining a healthy weight.  Staying physically active and exercising regularly. SYMPTOMS  The symptoms of abdominal aortic aneurysm may vary depending on the size and rate of growth of the aneurysm.Most grow slowly and do not have any symptoms. When symptoms do occur, they may include:  Pain (abdomen, side, lower back, or groin). The pain may vary in intensity. A sudden onset of severe pain may indicate that the aneurysm has ruptured.  Feeling full after eating only small amounts of food.  Nausea or vomiting or both.  Feeling a pulsating lump in the abdomen.  Feeling faint or passing out. DIAGNOSIS  Since most unruptured abdominal aortic aneurysms have no symptoms, they are often discovered during diagnostic exams for other conditions. An aneurysm may be found during the following procedures:  Ultrasonography (A one-time screening for abdominal aortic aneurysm by ultrasonography is also recommended for all men aged 65-75 years who have ever smoked).  X-ray exams.  A computed tomography (CT).  Magnetic resonance imaging (MRI).  Angiography or arteriography. TREATMENT  Treatment of an abdominal aortic aneurysm depends on the size of your aneurysm, your age, and risk factors for rupture. Medication to control blood pressure and pain may be used to manage aneurysms smaller than 6 cm. Regular monitoring for enlargement may be recommended by your caregiver if:  The aneurysm is 3-4 cm in size (an annual ultrasonography may be recommended).  The aneurysm is 4-4.5 cm in size (an ultrasonography every 6 months may be recommended).  The aneurysm is larger than 4.5 cm in   size (your caregiver may ask that you be examined by a vascular surgeon). If your aneurysm is larger than 6 cm, surgical repair may be recommended. There are two main methods for repair of an aneurysm:   Endovascular  repair (a minimally invasive surgery). This is done most often.  Open repair. This method is used if an endovascular repair is not possible. Document Released: 01/03/2005 Document Revised: 07/21/2012 Document Reviewed: 04/25/2012 Exit[employer] Patient Information 2015 [employer]. This information is not intended to replace advice given to you by your health care provider. Make sure you discuss any questions you have with your health care provider.  

## 2014-11-16 NOTE — Telephone Encounter (Signed)
Called patient at [phone] [facility]) *Preferred* and left message on machine to return call.

## 2014-11-18 ENCOUNTER — Ambulatory Visit (INDEPENDENT_AMBULATORY_CARE_PROVIDER_SITE_OTHER): Payer: 59 | Admitting: Family Medicine

## 2014-11-18 ENCOUNTER — Encounter: Payer: Self-pay | Admitting: Family Medicine

## 2014-11-18 VITALS — BP 138/84 | HR 78 | Temp 98.0°F | Resp 16 | Wt 262.2 lb

## 2014-11-18 DIAGNOSIS — I714 Abdominal aortic aneurysm, without rupture, unspecified: Secondary | ICD-10-CM | POA: Insufficient documentation

## 2014-11-18 DIAGNOSIS — K8020 Calculus of gallbladder without cholecystitis without obstruction: Secondary | ICD-10-CM

## 2014-11-18 MED ORDER — ATORVASTATIN CALCIUM 10 MG PO TABS: 10.0000 mg | ORAL_TABLET | Freq: Every day | ORAL | Status: DC

## 2014-11-18 MED ORDER — VALSARTAN-HYDROCHLOROTHIAZIDE 320-12.5 MG PO TABS: 1.0000 | ORAL_TABLET | Freq: Every day | ORAL | Status: DC

## 2014-11-18 NOTE — Progress Notes (Signed)
   Subjective:    Patient ID: Ashlee Williams, female    DOB: [birth year], 59 y.o.   MRN: [id number]  HPI ER f/u- pt was seen on 8/9 w/ RUQ pain.  Found to have gallstones w/o evidence of cholecystitis on [place].  Also noted to have 3.3 cm AAA.  Pt reports RUQ and epigastric pain is better.  Pt did not get referral to general surgery.  Pt is not on statin.  Needs better BP control.  Pt continues to smoke.   Review of Systems Denies CP, SOB, HAs, visual changes, edema, abd pain, N/V.    Objective:   Physical Exam  Constitutional: She is oriented to person, place, and time. She appears well-developed and well-nourished. No distress.  obese  HENT:  Head: Normocephalic and atraumatic.  Eyes: Conjunctivae and EOM are normal. Pupils are equal, round, and reactive to light.  Neck: Normal range of motion. Neck supple. No thyromegaly present.  Cardiovascular: Normal rate, regular rhythm, normal heart sounds and intact distal pulses.   No murmur heard. Pulmonary/Chest: Effort normal and breath sounds normal. No respiratory distress.  Abdominal: Soft. She exhibits no distension. There is no tenderness.  Musculoskeletal: She exhibits no edema.  Lymphadenopathy:    She has no cervical adenopathy.  Neurological: She is alert and oriented to person, place, and time.  Skin: Skin is warm and dry.  Psychiatric: She has a normal mood and affect. Her behavior is normal.  Vitals reviewed.         Assessment & Plan:

## 2014-11-18 NOTE — Progress Notes (Signed)
Pre visit review using our clinic review tool, if applicable. No additional management support is needed unless otherwise documented below in the visit note. 

## 2014-11-18 NOTE — Assessment & Plan Note (Signed)
New.  Found coincidentally on CT for abd pain.  Reviewed dx w/ pt and stressed need for risk reduction- BP control, smoking cessation, lipid reduction.  Will refer to vascular for ongoing follow up.  Pt expressed understanding and is in agreement w/ plan.

## 2014-11-18 NOTE — Patient Instructions (Signed)
Follow up in 5-6 weeks to recheck diabetes, BP, and cholesterol (cancel the November appt) We'll call you with your surgery and vascular appts Start the new Diovan prescription- the dose is now 320/12.5mg  daily Start the Lipitor daily STOP smoking!!! Call with any questions or concerns Hang in there!!!

## 2014-11-18 NOTE — Assessment & Plan Note (Signed)
New.  Currently asymptomatic but the pain sent pt to ER earlier this week.  Discussed need for low fat diet.  Will refer to surgery for evaluation and tx.  Pt expressed understanding and is in agreement w/ plan.

## 2014-12-23 ENCOUNTER — Ambulatory Visit: Payer: 59 | Admitting: Family Medicine

## 2015-01-19 ENCOUNTER — Encounter: Payer: Self-pay | Admitting: Family Medicine

## 2015-01-19 ENCOUNTER — Ambulatory Visit (INDEPENDENT_AMBULATORY_CARE_PROVIDER_SITE_OTHER): Payer: 59 | Admitting: Family Medicine

## 2015-01-19 VITALS — BP 132/86 | HR 80 | Temp 98.0°F | Resp 16 | Ht 63.0 in | Wt 271.5 lb

## 2015-01-19 DIAGNOSIS — I714 Abdominal aortic aneurysm, without rupture, unspecified: Secondary | ICD-10-CM

## 2015-01-19 DIAGNOSIS — E1169 Type 2 diabetes mellitus with other specified complication: Secondary | ICD-10-CM | POA: Diagnosis not present

## 2015-01-19 DIAGNOSIS — E119 Type 2 diabetes mellitus without complications: Secondary | ICD-10-CM

## 2015-01-19 DIAGNOSIS — E785 Hyperlipidemia, unspecified: Secondary | ICD-10-CM | POA: Diagnosis not present

## 2015-01-19 DIAGNOSIS — I10 Essential (primary) hypertension: Secondary | ICD-10-CM | POA: Diagnosis not present

## 2015-01-19 LAB — LIPID PANEL
CHOLESTEROL: 94 mg/dL (ref 0–200)
HDL: 39.7 mg/dL (ref >39.00)
LDL Cholesterol: 35 mg/dL (ref 0–99)
NonHDL: 54.17
Total CHOL/HDL Ratio: 2
Triglycerides: 98 mg/dL (ref 0.0–149.0)
VLDL: 19.6 mg/dL (ref 0.0–40.0)

## 2015-01-19 LAB — CBC WITH DIFFERENTIAL/PLATELET
BASOS PCT: 0.6 % (ref 0.0–3.0)
Basophils Absolute: 0 10*3/uL (ref 0.0–0.1)
Eosinophils Absolute: 0.2 10*3/uL (ref 0.0–0.7)
Eosinophils Relative: 3.3 % (ref 0.0–5.0)
HEMATOCRIT: 40.3 % (ref 36.0–46.0)
Hemoglobin: 12.7 g/dL (ref 12.0–15.0)
Lymphocytes Relative: 36.9 % (ref 12.0–46.0)
Lymphs Abs: 2.5 10*3/uL (ref 0.7–4.0)
MCHC: 31.4 g/dL (ref 30.0–36.0)
MCV: 84.1 fl (ref 78.0–100.0)
MONO ABS: 0.6 10*3/uL (ref 0.1–1.0)
Monocytes Relative: 8.7 % (ref 3.0–12.0)
NEUTROS ABS: 3.5 10*3/uL (ref 1.4–7.7)
Neutrophils Relative %: 50.5 % (ref 43.0–77.0)
PLATELETS: 216 10*3/uL (ref 150.0–400.0)
RBC: 4.8 Mil/uL (ref 3.87–5.11)
RDW: 15.5 % (ref 11.5–15.5)
WBC: 6.9 10*3/uL (ref 4.0–10.5)

## 2015-01-19 LAB — BASIC METABOLIC PANEL
BUN: 14 mg/dL (ref 6–23)
CHLORIDE: 106 meq/L (ref 96–112)
CO2: 32 mEq/L (ref 19–32)
Calcium: 9.3 mg/dL (ref 8.4–10.5)
Creatinine, Ser: 0.83 mg/dL (ref 0.40–1.20)
GFR: 90.77 mL/min (ref >60.00)
Glucose, Bld: 96 mg/dL (ref 70–99)
Potassium: 3.7 mEq/L (ref 3.5–5.1)
SODIUM: 142 meq/L (ref 135–145)

## 2015-01-19 LAB — HEPATIC FUNCTION PANEL
ALT: 19 U/L (ref 0–35)
AST: 17 U/L (ref 0–37)
Albumin: 3.6 g/dL (ref 3.5–5.2)
Alkaline Phosphatase: 66 U/L (ref 39–117)
BILIRUBIN DIRECT: 0.1 mg/dL (ref 0.0–0.3)
BILIRUBIN TOTAL: 0.3 mg/dL (ref 0.2–1.2)
Total Protein: 7.2 g/dL (ref 6.0–8.3)

## 2015-01-19 LAB — HEMOGLOBIN A1C: HEMOGLOBIN A1C: 6.4 % (ref 4.6–6.5)

## 2015-01-19 LAB — TSH: TSH: 1.2 u[IU]/mL (ref 0.35–4.50)

## 2015-01-19 NOTE — Assessment & Plan Note (Signed)
Ongoing issue for pt.  She has gained 10 lbs since her last visit.  Again stressed need for low carb diet and regular exercise.  UTD on eye exam, foot exam.  On ARB for renal protection.  Check labs.  Start meds prn.

## 2015-01-19 NOTE — Assessment & Plan Note (Signed)
Pt never got her vascular appt- will re-refer

## 2015-01-19 NOTE — Patient Instructions (Addendum)
Schedule your complete physical in 3-4 months We'll notify you of your lab results and make any changes if needed Continue to work on healthy diet and regular exercise- you can do it! QUIT SMOKING!!! Start low dose Aspirin 81mg  daily Call with any questions or concerns If you want to join us at the new [place], any scheduled appointments will automatically transfer and we will see you at [address], [place], [place] [id number] Happy Fall!!!

## 2015-01-19 NOTE — Progress Notes (Signed)
Pre visit review using our clinic review tool, if applicable. No additional management support is needed unless otherwise documented below in the visit note. 

## 2015-01-19 NOTE — Progress Notes (Signed)
   Subjective:    Patient ID: Ashlee Williams, female    DOB: 1955-05-08, 59 y.o.   MRN: [id number]  HPI HTN- chronic problem, on Valsartan HCTZ, Amlodipine.  No CP, SOB, HAs, visual changes, edema.  Hyperlipidemia- chronic problem, chronic problem, on Lipitor.  Denies abd pain, N/V, myalgias.  DM- chronic problem, attempting to control w/ diet but pt has gained another 10 lbs.  On ARB for renal protection.  UTD on foot, eye exam.  No regular exercise- 'i'm going to get back into it'.  Pt reports following a low carb diet.  AAA- pt states she never heard from Vascular (according to chart, they left 2 voicemails).  Cell phone # is correct in chart.   Review of Systems For ROS see HPI     Objective:   Physical Exam  Constitutional: She is oriented to person, place, and time. She appears well-developed and well-nourished. No distress.  obese  HENT:  Head: Normocephalic and atraumatic.  Eyes: Conjunctivae and EOM are normal. Pupils are equal, round, and reactive to light.  Neck: Normal range of motion. Neck supple. No thyromegaly present.  Cardiovascular: Normal rate, regular rhythm, normal heart sounds and intact distal pulses.   No murmur heard. Pulmonary/Chest: Effort normal and breath sounds normal. No respiratory distress.  Abdominal: Soft. She exhibits no distension. There is no tenderness.  Musculoskeletal: She exhibits no edema.  Lymphadenopathy:    She has no cervical adenopathy.  Neurological: She is alert and oriented to person, place, and time.  Skin: Skin is warm and dry.  Psychiatric: She has a normal mood and affect. Her behavior is normal.  Vitals reviewed.         Assessment & Plan:

## 2015-01-19 NOTE — Assessment & Plan Note (Signed)
Chronic problem.  BP is adequate but not ideally controlled.  Again stressed need for smoking cessation.  Check labs.  No anticipated med changes.

## 2015-01-19 NOTE — Assessment & Plan Note (Signed)
Chronic problem, tolerating statin w/o difficulty.  Check labs.  Adjust meds prn  

## 2015-01-20 ENCOUNTER — Encounter: Payer: Self-pay | Admitting: General Practice

## 2015-02-11 ENCOUNTER — Other Ambulatory Visit: Payer: Self-pay

## 2015-02-11 DIAGNOSIS — Z1231 Encounter for screening mammogram for malignant neoplasm of breast: Secondary | ICD-10-CM

## 2015-02-17 ENCOUNTER — Encounter: Payer: Self-pay | Admitting: Vascular Surgery

## 2015-02-22 ENCOUNTER — Encounter: Payer: Self-pay | Admitting: Vascular Surgery

## 2015-02-22 ENCOUNTER — Ambulatory Visit (INDEPENDENT_AMBULATORY_CARE_PROVIDER_SITE_OTHER): Payer: 59 | Admitting: Vascular Surgery

## 2015-02-22 VITALS — BP 123/86 | HR 70 | Temp 98.3°F | Resp 16 | Ht 63.0 in | Wt 273.4 lb

## 2015-02-22 DIAGNOSIS — I714 Abdominal aortic aneurysm, without rupture, unspecified: Secondary | ICD-10-CM

## 2015-02-22 NOTE — Progress Notes (Signed)
History of Present Illness:  Patient is a 59 y.o. year old female who presents for evaluation of abdominal aortic aneurysm.  The aneurysm is currently 3.3 cm in diameter by US.She was experiencing left lower quadrant pain and was [facility].  Primary finding included: Cholelithiasis without sonographic evidence of acute cholecystitis.  Past medical history includes: DM diet controlled, hyperlipidemia managed with Lipitor, and hypertension managed with Norvasc.   Past Medical History  Diagnosis Date  . Asthma   . Allergy   . Hypertension   . Blood in stool   . Chronic headaches   . Chicken pox   . Bronchitis   . Diabetes mellitus     PT. IS TRYING TO CONTROL WITH DIET BUT AS NOTED WEIGHT IS UP  . Tobacco abuse   . Obesity   . AAA (abdominal aortic aneurysm) (HCC)   . Anemia     Past Surgical History  Procedure Laterality Date  . Abdominal hysterectomy  1998     Social History Social History  Substance Use Topics  . Smoking status: Former Smoker -- 0.50 packs/day for 10 years    Types: Cigarettes    Quit date: 07/09/1999  . Smokeless tobacco: Never Used  . Alcohol Use: Yes     Comment: occasionally    Family History Family History  Problem Relation Age of Onset  . Hypertension Mother   . Diabetes Sister   . Hypertension Sister   . Cancer Maternal Aunt   . Hypertension Maternal Aunt   . Stroke Maternal Grandmother   . Hypertension Maternal Grandmother   . Colon cancer Maternal Grandmother [id number]  . Cancer Maternal Grandfather   . Hypertension Maternal Grandfather   . Hypertension Father   . Hypertension Brother   . Hypertension Maternal Uncle   . Hypertension Paternal Aunt   . Hypertension Paternal Uncle   . Hypertension Paternal Grandmother   . Hypertension Paternal Grandfather     Allergies  No Known Allergies   Current Outpatient Prescriptions  Medication Sig Dispense Refill  . albuterol (PROAIR HFA) 108 (90 BASE) MCG/ACT inhaler  Inhale 2 puffs into the lungs every 4 (four) hours as needed for wheezing or shortness of breath. 1 Inhaler 6  . amLODipine (NORVASC) 2.5 MG tablet Take 1 tablet (2.5 mg total) by mouth daily. 30 tablet 6  . APAP-Isometheptene-Dichloral (MIDRIN) 325-65-100 MG CAPS Take 1 capsule by mouth every 6 (six) hours as needed.     Marland Kitchen. atorvastatin (LIPITOR) 10 MG tablet Take 1 tablet (10 mg total) by mouth daily. 30 tablet 3  . calcium-vitamin D (CALCIUM 500/D) 500-200 MG-UNIT per tablet Take 1 tablet by mouth.    . cetirizine (ZYRTEC) 10 MG tablet Take 10 mg by mouth daily.    Marland Kitchen. HYDROcodone-acetaminophen (NORCO/VICODIN) 5-325 MG per tablet Take 1-2 tablets by mouth every 6 (six) hours as needed. 10 tablet 0  . Multiple Vitamins-Minerals (MULTIVITAMIN WITH MINERALS) tablet Take 1 tablet by mouth daily.    . ondansetron (ZOFRAN ODT) 4 MG disintegrating tablet Take 1 tablet (4 mg total) by mouth every 8 (eight) hours as needed for nausea or vomiting. 20 tablet 0  . valsartan-hydrochlorothiazide (DIOVAN-HCT) 320-12.5 MG per tablet Take 1 tablet by mouth daily. 30 tablet 3   No current facility-administered medications for this visit.    ROS:   General:  No weight loss, Fever, chills  HEENT: No recent headaches, no nasal bleeding, no visual changes, no sore throat  Neurologic: No dizziness, blackouts, seizures. No recent symptoms of stroke or mini- stroke. No recent episodes of slurred speech, or temporary blindness.  Cardiac: No recent episodes of chest pain/pressure, no shortness of breath at rest.  No shortness of breath with exertion.  Denies history of atrial fibrillation or irregular heartbeat  Vascular: No history of rest pain in feet.  No history of claudication.  No history of non-healing ulcer, No history of DVT   Pulmonary: No home oxygen, no productive cough, no hemoptysis,  No asthma or wheezing, history of bronchitis   Musculoskeletal:   Arthritis,  Low back pain,   Joint  pain  Hematologic:No history of hypercoagulable state.  No history of easy bleeding.  No history of anemia  Gastrointestinal: No hematochezia or melena,  No gastroesophageal reflux, no trouble swallowing  Urinary:  chronic Kidney disease,  on HD -  MWF or  TTHS,  Burning with urination,  Frequent urination,  Difficulty urinating;   Skin: No rashes  Psychological: No history of anxiety,  No history of depression   Physical Examination  Filed Vitals:   02/22/15 1310  BP: 123/86  Pulse: 70  Temp: 98.3 F (36.8 C)  TempSrc: Oral  Resp: 16  Height:  (1.6 m)  Weight: 273 lb 6.4 oz (124.013 kg)  SpO2: 100%    Body mass index is 48.44 kg/(m^2).  General:  Alert and oriented, no acute distress HEENT: Normal Neck: No bruit or JVD Pulmonary: Clear to auscultation bilaterally Cardiac: Regular Rate and Rhythm without murmur Gastrointestinal: Soft, non-tender, non-distended, no mass, no scars Skin: No rash Extremity Pulses:  2+ radial, brachial, femoral, dorsalis pedis, posterior tibial pulses bilaterally Musculoskeletal: No deformity or edema  Neurologic: Upper and lower extremity motor 5/5 and symmetric  DATA:  Abdominal ultrasound 11/16/2014 IMPRESSION: 1. Cholelithiasis without sonographic evidence of acute cholecystitis. 2. Mild abdominal aortic aneurysm measuring 3.3 cm in AP diameter.  ASSESSMENT:  Mild abdominal aortic aneurysm measuring 3.3 cm in AP diameter.  PLAN: She currently has zero risk based on her AAA ultrasound of rupture.  The " normal aorta measures 2.0 cm.  There are no daily activity restriction, other than a healthy life style and maintain good BP control as well as DM control.   She will follow up in 1 year for a repeat AAA ultrasound.    COLLINS, EMMA MAUREEN PA-C The patient was seen in conjunction with Dr. Arbie Cookey   [facility] I have examined the patient, reviewed and agree with above.  Incidental finding of 3.3 cm aneurysm. Explained the need for limitation. Explained that her young age and good health there is a reasonable chance that over her lifetime this will reach a size that is concerning. For now I will keep yearly ultrasound follow-up schedule.  Gretta Began, MD 02/22/2015 2:47 PM

## 2015-02-22 NOTE — Addendum Note (Signed)
Addended by: Adria DillELDRIDGE-LEWIS, Latasia Silberstein L on: 02/22/2015 03:54 PM   Modules accepted: Orders

## 2015-02-25 ENCOUNTER — Encounter: Payer: 59 | Admitting: Family Medicine

## 2015-03-15 ENCOUNTER — Observation Stay (HOSPITAL_COMMUNITY)
Admission: EM | Admit: 2015-03-15 | Discharge: 2015-03-17 | Disposition: A | Discharge status: Home / Self Care | Payer: 59 | Attending: Surgery | Admitting: Surgery

## 2015-03-15 ENCOUNTER — Encounter (HOSPITAL_COMMUNITY): Payer: Self-pay | Admitting: Emergency Medicine

## 2015-03-15 DIAGNOSIS — E669 Obesity, unspecified: Secondary | ICD-10-CM | POA: Diagnosis not present

## 2015-03-15 DIAGNOSIS — Z6841 Body Mass Index (BMI) 40.0 and over, adult: Secondary | ICD-10-CM | POA: Insufficient documentation

## 2015-03-15 DIAGNOSIS — Z87891 Personal history of nicotine dependence: Secondary | ICD-10-CM | POA: Diagnosis not present

## 2015-03-15 DIAGNOSIS — J45909 Unspecified asthma, uncomplicated: Secondary | ICD-10-CM | POA: Diagnosis not present

## 2015-03-15 DIAGNOSIS — E119 Type 2 diabetes mellitus without complications: Secondary | ICD-10-CM | POA: Diagnosis not present

## 2015-03-15 DIAGNOSIS — I10 Essential (primary) hypertension: Secondary | ICD-10-CM | POA: Diagnosis not present

## 2015-03-15 DIAGNOSIS — K8020 Calculus of gallbladder without cholecystitis without obstruction: Secondary | ICD-10-CM | POA: Diagnosis present

## 2015-03-15 DIAGNOSIS — R112 Nausea with vomiting, unspecified: Secondary | ICD-10-CM | POA: Diagnosis present

## 2015-03-15 DIAGNOSIS — K8012 Calculus of gallbladder with acute and chronic cholecystitis without obstruction: Principal | ICD-10-CM | POA: Insufficient documentation

## 2015-03-15 DIAGNOSIS — I714 Abdominal aortic aneurysm, without rupture: Secondary | ICD-10-CM | POA: Insufficient documentation

## 2015-03-15 LAB — CBC
HCT: 40.7 % (ref 36.0–46.0)
HEMOGLOBIN: 13.1 g/dL (ref 12.0–15.0)
MCH: 27.2 pg (ref 26.0–34.0)
MCHC: 32.2 g/dL (ref 30.0–36.0)
MCV: 84.6 fL (ref 78.0–100.0)
Platelets: 212 10*3/uL (ref 150–400)
RBC: 4.81 MIL/uL (ref 3.87–5.11)
RDW: 15 % (ref 11.5–15.5)
WBC: 8.1 10*3/uL (ref 4.0–10.5)

## 2015-03-15 LAB — URINALYSIS, ROUTINE W REFLEX MICROSCOPIC
BILIRUBIN URINE: NEGATIVE
GLUCOSE, UA: NEGATIVE mg/dL
HGB URINE DIPSTICK: NEGATIVE
KETONES UR: NEGATIVE mg/dL
Leukocytes, UA: NEGATIVE
Nitrite: NEGATIVE
Protein, ur: NEGATIVE mg/dL
SPECIFIC GRAVITY, URINE: 1.02 (ref 1.005–1.030)
pH: 7 (ref 5.0–8.0)

## 2015-03-15 LAB — COMPREHENSIVE METABOLIC PANEL
ALBUMIN: 3.3 g/dL — AB (ref 3.5–5.0)
ALT: 27 U/L (ref 14–54)
ANION GAP: 9 (ref 5–15)
AST: 21 U/L (ref 15–41)
Alkaline Phosphatase: 66 U/L (ref 38–126)
BILIRUBIN TOTAL: 0.4 mg/dL (ref 0.3–1.2)
BUN: 11 mg/dL (ref 6–20)
CHLORIDE: 107 mmol/L (ref 101–111)
CO2: 27 mmol/L (ref 22–32)
Calcium: 9.3 mg/dL (ref 8.9–10.3)
Creatinine, Ser: 0.88 mg/dL (ref 0.44–1.00)
GFR calc Af Amer: >60 mL/min (ref >60)
GFR calc non Af Amer: >60 mL/min (ref >60)
GLUCOSE: 124 mg/dL — AB (ref 65–99)
POTASSIUM: 3.9 mmol/L (ref 3.5–5.1)
SODIUM: 143 mmol/L (ref 135–145)
TOTAL PROTEIN: 7.2 g/dL (ref 6.5–8.1)

## 2015-03-15 LAB — LIPASE, BLOOD: Lipase: 22 U/L (ref 11–51)

## 2015-03-15 NOTE — ED Notes (Signed)
Pt. reports pain across her abdomen with nausea and emesis this evening , pain radiating to mid back , denies dysuria or hematuria , no fever or chills.

## 2015-03-15 NOTE — ED Provider Notes (Addendum)
CSN: [id number]     Arrival date & time 03/15/15  2157 History  By signing my name below, I, Arianna Nassar, attest that this documentation has been prepared under the direction and in the presence of Tomasita CrumbleAdeleke Keatyn Jawad, MD. Electronically Signed: Octavia HeirArianna Nassar, ED Scribe. 03/16/2015. 12:08 AM.     Chief Complaint  Patient presents with  . Abdominal Pain      The history is provided by the patient. No language interpreter was used.   HPI Comments: Ashlee Williams is a 59 y.o. female who has a hx of HTN, AAA and gall stones presents to the Emergency Department complaining of constant, moderate, gradual worsening lower abdominal pain that radiates to her back. She has been having associated nausea, emesis and chills. She reports occasionally food will increase her pain and she has been having loss of appetite. Pt was seen at [facility] a few months ago and was diagnosed with gall stones and AAA. Pt has had 3 flare ups since being diagnosed and has not had the gall stones removed yet. Pt denies diarrhea, urinary symptoms, hematuria, dysuria, and fever.  Past Medical History  Diagnosis Date  . Asthma   . Allergy   . Hypertension   . Blood in stool   . Chronic headaches   . Chicken pox   . Bronchitis   . Diabetes mellitus     PT. IS TRYING TO CONTROL WITH DIET BUT AS NOTED WEIGHT IS UP  . Tobacco abuse   . Obesity   . AAA (abdominal aortic aneurysm) (HCC)   . Anemia    Past Surgical History  Procedure Laterality Date  . Abdominal hysterectomy  1998   Family History  Problem Relation Age of Onset  . Hypertension Mother   . Diabetes Sister   . Hypertension Sister   . Cancer Maternal Aunt   . Hypertension Maternal Aunt   . Stroke Maternal Grandmother   . Hypertension Maternal Grandmother   . Colon cancer Maternal Grandmother [id number]  . Cancer Maternal Grandfather   . Hypertension Maternal Grandfather   . Hypertension Father   . Hypertension Brother   . Hypertension Maternal Uncle   .  Hypertension Paternal Aunt   . Hypertension Paternal Uncle   . Hypertension Paternal Grandmother   . Hypertension Paternal Grandfather    Social History  Substance Use Topics  . Smoking status: Former Smoker -- 0.50 packs/day for 10 years    Types: Cigarettes    Quit date: 07/09/1999  . Smokeless tobacco: Never Used  . Alcohol Use: Yes   OB History    No data available     Review of Systems  A complete 10 system review of systems was obtained and all systems are negative except as noted in the HPI and PMH.    Allergies  Review of patient's allergies indicates no known allergies.  Home Medications   Prior to Admission medications   Medication Sig Start Date End Date Taking? Authorizing Provider  albuterol (PROAIR HFA) 108 (90 BASE) MCG/ACT inhaler Inhale 2 puffs into the lungs every 4 (four) hours as needed for wheezing or shortness of breath. 08/06/14   Sheliah HatchKatherine E Tabori, MD  amLODipine (NORVASC) 2.5 MG tablet Take 1 tablet (2.5 mg total) by mouth daily. 10/06/14   Sheliah HatchKatherine E Tabori, MD  APAP-Isometheptene-Dichloral (MIDRIN) 7430853446325-65-100 MG CAPS Take 1 capsule by mouth every 6 (six) hours as needed.     Historical Provider, MD  atorvastatin (LIPITOR) 10 MG tablet Take 1  tablet (10 mg total) by mouth daily. 11/18/14   Sheliah Hatch, MD  calcium-vitamin D (CALCIUM 500/D) 500-200 MG-UNIT per tablet Take 1 tablet by mouth.    Historical Provider, MD  cetirizine (ZYRTEC) 10 MG tablet Take 10 mg by mouth daily.    Historical Provider, MD  HYDROcodone-acetaminophen (NORCO/VICODIN) 5-325 MG per tablet Take 1-2 tablets by mouth every 6 (six) hours as needed. 11/16/14   Teressa Lower, NP  Multiple Vitamins-Minerals (MULTIVITAMIN WITH MINERALS) tablet Take 1 tablet by mouth daily.    Historical Provider, MD  ondansetron (ZOFRAN ODT) 4 MG disintegrating tablet Take 1 tablet (4 mg total) by mouth every 8 (eight) hours as needed for nausea or vomiting. 11/16/14   Teressa Lower, NP   valsartan-hydrochlorothiazide (DIOVAN-HCT) 320-12.5 MG per tablet Take 1 tablet by mouth daily. 11/18/14   Sheliah Hatch, MD   Triage vitals: BP 160/92 mmHg  Pulse 72  Temp(Src) 98 F (36.7 C) (Oral)  Resp 20  SpO2 98%  LMP 11/10/2010 Physical Exam  Constitutional: She is oriented to person, place, and time. She appears well-developed and well-nourished. No distress.  HENT:  Head: Normocephalic and atraumatic.  Nose: Nose normal.  Mouth/Throat: Oropharynx is clear and moist. No oropharyngeal exudate.  Eyes: Conjunctivae and EOM are normal. Pupils are equal, round, and reactive to light. No scleral icterus.  Neck: Normal range of motion. Neck supple. No JVD present. No tracheal deviation present. No thyromegaly present.  Cardiovascular: Normal rate, regular rhythm and normal heart sounds.  Exam reveals no gallop and no friction rub.   No murmur heard. Pulmonary/Chest: Effort normal and breath sounds normal. No respiratory distress. She has no wheezes. She exhibits no tenderness.  Abdominal: Soft. Bowel sounds are normal. She exhibits no distension and no mass. There is tenderness. There is no rebound and no guarding.  diffuse RUQ tenderness with positive Murphy's sign  Musculoskeletal: Normal range of motion. She exhibits no edema or tenderness.  Lymphadenopathy:    She has no cervical adenopathy.  Neurological: She is alert and oriented to person, place, and time. No cranial nerve deficit. She exhibits normal muscle tone.  Skin: Skin is warm and dry. No rash noted. No erythema. No pallor.  Nursing note and vitals reviewed.   ED Course  Procedures  DIAGNOSTIC STUDIES: Oxygen Saturation is 98% on RA, normal by my interpretation.  COORDINATION OF CARE:  12:01 AM Discussed treatment plan which includes pain medication, [place] of abdomen with pt at bedside and pt agreed to plan.  Labs Review Labs Reviewed  COMPREHENSIVE METABOLIC PANEL - Abnormal; Notable for the following:     Glucose, Bld 124 (*)    Albumin 3.3 (*)    All other components within normal limits  LIPASE, BLOOD  CBC  URINALYSIS, ROUTINE W REFLEX MICROSCOPIC (NOT AT [facility])  PREGNANCY, URINE    Imaging Review US Abdomen Complete  03/16/2015  CLINICAL DATA:  59 year old female with abdominal pain radiating to the back. History of abdominal aortic aneurysm and gallstones. EXAM: ULTRASOUND ABDOMEN COMPLETE COMPARISON:  Ultrasound dated 11/16/2014 FINDINGS: Evaluation is limited due to patient's body habitus. Gallbladder: There small stones within the gallbladder. There is no gallbladder wall thickening or pericholecystic fluid. No sonographic Murphy's sign. Common bile duct: Diameter: 3 mm Liver: Mildly heterogeneous.  In IVC: No abnormality visualized. Pancreas: Not visualized Spleen: Size and appearance within normal limits. Right Kidney: Length: 11.4 cm. Echogenicity within normal limits. No mass or hydronephrosis visualized. Left Kidney: Length: 11.5 cm. Echogenicity  within normal limits. No mass or hydronephrosis visualized. Abdominal aorta: The abdominal aorta is not well visualized. The aorta measures up to 3 cm in diameter proximally. Other findings: None. IMPRESSION: Cholelithiasis without sonographic evidence of acute cholecystitis. Mild aneurysmal dilatation of the proximal aorta measuring up to 3 cm in diameter. Follow-up is recommended. Electronically Signed   By: Elgie Collard M.D.   On: 03/16/2015 01:38   I have personally reviewed and evaluated these images and lab results as part of my medical decision-making.   EKG Interpretation None      MDM   Final diagnoses:  None   Patient presents to the ED for abdominal pain with h/o AAA and choleithiasis.  Will obtain repeat US for evaluation.  Patient saw Dr. Andrey Campanile in the clinic who recommended surgery.  Will consult for inpatient treatment.   I spoke with Dr. Magnus Ivan for general surgery who will admit the patient for symptomatic  cholelithiasis.   I personally performed the services described in this documentation, which was scribed in my presence. The recorded information has been reviewed and is accurate.       Tomasita Crumble, MD 03/16/15 [phone]

## 2015-03-16 ENCOUNTER — Emergency Department (HOSPITAL_COMMUNITY): Payer: 59

## 2015-03-16 ENCOUNTER — Observation Stay (HOSPITAL_COMMUNITY): Payer: 59 | Admitting: Anesthesiology

## 2015-03-16 ENCOUNTER — Encounter (HOSPITAL_COMMUNITY): Payer: Self-pay | Admitting: *Deleted

## 2015-03-16 ENCOUNTER — Encounter (HOSPITAL_COMMUNITY)
Admission: EM | Disposition: A | Discharge status: Home / Self Care | Payer: Self-pay | Source: Home / Self Care | Attending: Emergency Medicine

## 2015-03-16 DIAGNOSIS — K8020 Calculus of gallbladder without cholecystitis without obstruction: Secondary | ICD-10-CM | POA: Diagnosis present

## 2015-03-16 HISTORY — PX: CHOLECYSTECTOMY: SHX55

## 2015-03-16 LAB — PREGNANCY, URINE: PREG TEST UR: NEGATIVE

## 2015-03-16 LAB — GLUCOSE, CAPILLARY: Glucose-Capillary: 113 mg/dL — ABNORMAL HIGH (ref 65–99)

## 2015-03-16 SURGERY — LAPAROSCOPIC CHOLECYSTECTOMY
Anesthesia: General | Site: Abdomen

## 2015-03-16 MED ORDER — PROMETHAZINE HCL 25 MG/ML IJ SOLN: 6.2500 mg | INTRAMUSCULAR | Status: DC | PRN

## 2015-03-16 MED ORDER — AMLODIPINE BESYLATE 2.5 MG PO TABS
2.5000 mg | ORAL_TABLET | Freq: Every day | ORAL | Status: DC
  Administered 2015-03-17: 2.5 mg via ORAL
  Filled 2015-03-16: qty 1

## 2015-03-16 MED ORDER — IRBESARTAN 300 MG PO TABS
300.0000 mg | ORAL_TABLET | Freq: Every day | ORAL | Status: DC
  Administered 2015-03-17: 300 mg via ORAL
  Filled 2015-03-16: qty 1

## 2015-03-16 MED ORDER — BUPIVACAINE-EPINEPHRINE 0.25% -1:200000 IJ SOLN
INTRAMUSCULAR | Status: DC | PRN
  Administered 2015-03-16: 16 mL

## 2015-03-16 MED ORDER — MIDAZOLAM HCL 5 MG/5ML IJ SOLN
INTRAMUSCULAR | Status: DC | PRN
  Administered 2015-03-16: 2 mg via INTRAVENOUS

## 2015-03-16 MED ORDER — NEOSTIGMINE METHYLSULFATE 10 MG/10ML IV SOLN
INTRAVENOUS | Status: AC
  Filled 2015-03-16: qty 5

## 2015-03-16 MED ORDER — PROPOFOL 10 MG/ML IV BOLUS
INTRAVENOUS | Status: DC | PRN
  Administered 2015-03-16: 170 mg via INTRAVENOUS

## 2015-03-16 MED ORDER — ROCURONIUM BROMIDE 50 MG/5ML IV SOLN
INTRAVENOUS | Status: AC
  Filled 2015-03-16: qty 1

## 2015-03-16 MED ORDER — MIDAZOLAM HCL 2 MG/2ML IJ SOLN
INTRAMUSCULAR | Status: AC
  Filled 2015-03-16: qty 2

## 2015-03-16 MED ORDER — FENTANYL CITRATE (PF) 250 MCG/5ML IJ SOLN
INTRAMUSCULAR | Status: AC
  Filled 2015-03-16: qty 5

## 2015-03-16 MED ORDER — NEOSTIGMINE METHYLSULFATE 10 MG/10ML IV SOLN
INTRAVENOUS | Status: DC | PRN
  Administered 2015-03-16: 5 mg via INTRAVENOUS

## 2015-03-16 MED ORDER — GLYCOPYRROLATE 0.2 MG/ML IJ SOLN
INTRAMUSCULAR | Status: AC
  Filled 2015-03-16: qty 2

## 2015-03-16 MED ORDER — KETOROLAC TROMETHAMINE 30 MG/ML IJ SOLN
INTRAMUSCULAR | Status: AC
  Filled 2015-03-16: qty 1

## 2015-03-16 MED ORDER — BUPIVACAINE-EPINEPHRINE (PF) 0.25% -1:200000 IJ SOLN
INTRAMUSCULAR | Status: AC
  Filled 2015-03-16: qty 30

## 2015-03-16 MED ORDER — PROPOFOL 10 MG/ML IV BOLUS
INTRAVENOUS | Status: AC
  Filled 2015-03-16: qty 20

## 2015-03-16 MED ORDER — FENTANYL CITRATE (PF) 100 MCG/2ML IJ SOLN
INTRAMUSCULAR | Status: DC | PRN
  Administered 2015-03-16 (×2): 50 ug via INTRAVENOUS
  Administered 2015-03-16: 100 ug via INTRAVENOUS

## 2015-03-16 MED ORDER — HYDROMORPHONE HCL 1 MG/ML IJ SOLN
INTRAMUSCULAR | Status: AC
  Filled 2015-03-16: qty 1

## 2015-03-16 MED ORDER — SODIUM CHLORIDE 0.9 % IR SOLN
Status: DC | PRN
  Administered 2015-03-16: 1000 mL

## 2015-03-16 MED ORDER — KETOROLAC TROMETHAMINE 30 MG/ML IJ SOLN
30.0000 mg | Freq: Once | INTRAMUSCULAR | Status: AC
  Administered 2015-03-16: 30 mg via INTRAVENOUS

## 2015-03-16 MED ORDER — HYDROMORPHONE HCL 1 MG/ML IJ SOLN: 1.0000 mg | INTRAMUSCULAR | Status: DC | PRN

## 2015-03-16 MED ORDER — LABETALOL HCL 5 MG/ML IV SOLN
INTRAVENOUS | Status: DC | PRN
  Administered 2015-03-16: 5 mg via INTRAVENOUS

## 2015-03-16 MED ORDER — LACTATED RINGERS IV SOLN
Freq: Once | INTRAVENOUS | Status: AC
  Administered 2015-03-16: 14:00:00 via INTRAVENOUS

## 2015-03-16 MED ORDER — LIDOCAINE HCL (CARDIAC) 20 MG/ML IV SOLN
INTRAVENOUS | Status: DC | PRN
  Administered 2015-03-16: 90 mg via INTRAVENOUS

## 2015-03-16 MED ORDER — LABETALOL HCL 5 MG/ML IV SOLN
INTRAVENOUS | Status: AC
  Filled 2015-03-16: qty 4

## 2015-03-16 MED ORDER — GLYCOPYRROLATE 0.2 MG/ML IJ SOLN
INTRAMUSCULAR | Status: AC
  Filled 2015-03-16: qty 4

## 2015-03-16 MED ORDER — 0.9 % SODIUM CHLORIDE (POUR BTL) OPTIME
TOPICAL | Status: DC | PRN
  Administered 2015-03-16: 1000 mL

## 2015-03-16 MED ORDER — HYDROCHLOROTHIAZIDE 12.5 MG PO CAPS
12.5000 mg | ORAL_CAPSULE | Freq: Every day | ORAL | Status: DC
  Administered 2015-03-17: 12.5 mg via ORAL
  Filled 2015-03-16: qty 1

## 2015-03-16 MED ORDER — VALSARTAN-HYDROCHLOROTHIAZIDE 320-12.5 MG PO TABS: 1.0000 | ORAL_TABLET | Freq: Every day | ORAL | Status: DC

## 2015-03-16 MED ORDER — LACTATED RINGERS IV SOLN
INTRAVENOUS | Status: DC | PRN
  Administered 2015-03-16 (×2): via INTRAVENOUS

## 2015-03-16 MED ORDER — SODIUM CHLORIDE 0.9 % IJ SOLN
INTRAMUSCULAR | Status: AC
  Filled 2015-03-16: qty 10

## 2015-03-16 MED ORDER — GLYCOPYRROLATE 0.2 MG/ML IJ SOLN
INTRAMUSCULAR | Status: DC | PRN
  Administered 2015-03-16: .8 mg via INTRAVENOUS

## 2015-03-16 MED ORDER — OXYCODONE-ACETAMINOPHEN 5-325 MG PO TABS
1.0000 | ORAL_TABLET | ORAL | Status: DC | PRN
  Administered 2015-03-16 – 2015-03-17 (×2): 2 via ORAL
  Filled 2015-03-16 (×2): qty 2

## 2015-03-16 MED ORDER — ROCURONIUM BROMIDE 100 MG/10ML IV SOLN
INTRAVENOUS | Status: DC | PRN
  Administered 2015-03-16: 30 mg via INTRAVENOUS
  Administered 2015-03-16: 20 mg via INTRAVENOUS

## 2015-03-16 MED ORDER — ENOXAPARIN SODIUM 60 MG/0.6ML ~~LOC~~ SOLN
60.0000 mg | SUBCUTANEOUS | Status: DC
  Filled 2015-03-16: qty 0.6

## 2015-03-16 MED ORDER — KETOROLAC TROMETHAMINE 30 MG/ML IJ SOLN
30.0000 mg | Freq: Once | INTRAMUSCULAR | Status: AC
  Administered 2015-03-16: 30 mg via INTRAVENOUS
  Filled 2015-03-16: qty 1

## 2015-03-16 MED ORDER — ONDANSETRON HCL 4 MG/2ML IJ SOLN
INTRAMUSCULAR | Status: AC
  Filled 2015-03-16: qty 2

## 2015-03-16 MED ORDER — HYDROMORPHONE HCL 1 MG/ML IJ SOLN
0.2500 mg | INTRAMUSCULAR | Status: DC | PRN
  Administered 2015-03-16 (×2): 0.5 mg via INTRAVENOUS

## 2015-03-16 MED ORDER — LIDOCAINE HCL (CARDIAC) 20 MG/ML IV SOLN
INTRAVENOUS | Status: AC
  Filled 2015-03-16: qty 5

## 2015-03-16 MED ORDER — ONDANSETRON 4 MG PO TBDP: 4.0000 mg | ORAL_TABLET | Freq: Four times a day (QID) | ORAL | Status: DC | PRN

## 2015-03-16 MED ORDER — POTASSIUM CHLORIDE IN NACL 20-0.9 MEQ/L-% IV SOLN
INTRAVENOUS | Status: DC
  Administered 2015-03-16 – 2015-03-17 (×2): via INTRAVENOUS
  Filled 2015-03-16 (×2): qty 1000

## 2015-03-16 MED ORDER — HYDROMORPHONE HCL 1 MG/ML IJ SOLN: 0.5000 mg | INTRAMUSCULAR | Status: DC | PRN

## 2015-03-16 MED ORDER — CEFAZOLIN SODIUM-DEXTROSE 2-3 GM-% IV SOLR
2.0000 g | Freq: Once | INTRAVENOUS | Status: AC
  Administered 2015-03-16: 2 g via INTRAVENOUS
  Filled 2015-03-16 (×2): qty 50

## 2015-03-16 MED ORDER — ONDANSETRON HCL 4 MG/2ML IJ SOLN: 4.0000 mg | Freq: Four times a day (QID) | INTRAMUSCULAR | Status: DC | PRN

## 2015-03-16 MED ORDER — ARTIFICIAL TEARS OP OINT
TOPICAL_OINTMENT | OPHTHALMIC | Status: AC
  Filled 2015-03-16: qty 3.5

## 2015-03-16 MED ORDER — SUCCINYLCHOLINE CHLORIDE 20 MG/ML IJ SOLN
INTRAMUSCULAR | Status: DC | PRN
  Administered 2015-03-16: 110 mg via INTRAVENOUS

## 2015-03-16 MED ORDER — DEXAMETHASONE SODIUM PHOSPHATE 4 MG/ML IJ SOLN
INTRAMUSCULAR | Status: AC
  Filled 2015-03-16: qty 2

## 2015-03-16 MED ORDER — MORPHINE SULFATE (PF) 4 MG/ML IV SOLN
4.0000 mg | Freq: Once | INTRAVENOUS | Status: AC
  Administered 2015-03-16: 4 mg via INTRAVENOUS
  Filled 2015-03-16: qty 1

## 2015-03-16 SURGICAL SUPPLY — 44 items
ADH SKN CLS APL DERMABOND .7 (GAUZE/BANDAGES/DRESSINGS) ×1
APPLIER CLIP 5 13 M/L LIGAMAX5 (MISCELLANEOUS) ×2
APR CLP MED LRG 5 ANG JAW (MISCELLANEOUS) ×1
BAG SPEC RTRVL LRG 6X4 10 (ENDOMECHANICALS)
BLADE SURG ROTATE 9660 (MISCELLANEOUS) IMPLANT
CANISTER SUCTION 2500CC (MISCELLANEOUS) ×2 IMPLANT
CHLORAPREP W/TINT 26ML (MISCELLANEOUS) ×2 IMPLANT
CLIP APPLIE 5 13 M/L LIGAMAX5 (MISCELLANEOUS) ×1 IMPLANT
COVER SURGICAL LIGHT HANDLE (MISCELLANEOUS) ×2 IMPLANT
DECANTER SPIKE VIAL GLASS SM (MISCELLANEOUS) ×1 IMPLANT
DERMABOND ADVANCED (GAUZE/BANDAGES/DRESSINGS) ×1
DERMABOND ADVANCED .7 DNX12 (GAUZE/BANDAGES/DRESSINGS) ×1 IMPLANT
DRAPE LAPAROSCOPIC ABDOMINAL (DRAPES) ×1 IMPLANT
DRSG TEGADERM 2-3/8X2-3/4 SM (GAUZE/BANDAGES/DRESSINGS) ×5 IMPLANT
ELECT REM PT RETURN 9FT ADLT (ELECTROSURGICAL) ×2
ELECTRODE REM PT RTRN 9FT ADLT (ELECTROSURGICAL) ×1 IMPLANT
GLOVE BIO SURGEON STRL SZ7.5 (GLOVE) ×1 IMPLANT
GLOVE BIOGEL PI IND STRL 7.0 (GLOVE) IMPLANT
GLOVE BIOGEL PI IND STRL 7.5 (GLOVE) IMPLANT
GLOVE BIOGEL PI IND STRL 8 (GLOVE) ×1 IMPLANT
GLOVE BIOGEL PI INDICATOR 7.0 (GLOVE) ×2
GLOVE BIOGEL PI INDICATOR 7.5 (GLOVE) ×2
GLOVE BIOGEL PI INDICATOR 8 (GLOVE) ×1
GLOVE ECLIPSE 7.5 STRL STRAW (GLOVE) ×3 IMPLANT
GLOVE SURG SS PI 7.0 STRL IVOR (GLOVE) ×1 IMPLANT
GOWN STRL REUS W/ TWL LRG LVL3 (GOWN DISPOSABLE) ×3 IMPLANT
GOWN STRL REUS W/TWL LRG LVL3 (GOWN DISPOSABLE) ×8
KIT BASIN OR (CUSTOM PROCEDURE TRAY) ×2 IMPLANT
KIT ROOM TURNOVER OR (KITS) ×2 IMPLANT
NS IRRIG 1000ML POUR BTL (IV SOLUTION) ×2 IMPLANT
PAD ARMBOARD 7.5X6 YLW CONV (MISCELLANEOUS) ×2 IMPLANT
POUCH SPECIMEN RETRIEVAL 10MM (ENDOMECHANICALS) IMPLANT
SCISSORS LAP 5X35 DISP (ENDOMECHANICALS) ×2 IMPLANT
SET IRRIG TUBING LAPAROSCOPIC (IRRIGATION / IRRIGATOR) ×2 IMPLANT
SLEEVE ENDOPATH XCEL 5M (ENDOMECHANICALS) ×4 IMPLANT
SPECIMEN JAR SMALL (MISCELLANEOUS) ×2 IMPLANT
STRIP CLOSURE SKIN 1/2X4 (GAUZE/BANDAGES/DRESSINGS) ×2 IMPLANT
SUT MNCRL AB 4-0 PS2 18 (SUTURE) ×2 IMPLANT
TOWEL OR 17X24 6PK STRL BLUE (TOWEL DISPOSABLE) ×2 IMPLANT
TOWEL OR 17X26 10 PK STRL BLUE (TOWEL DISPOSABLE) ×2 IMPLANT
TRAY LAPAROSCOPIC MC (CUSTOM PROCEDURE TRAY) ×2 IMPLANT
TROCAR XCEL BLUNT TIP 100MML (ENDOMECHANICALS) ×2 IMPLANT
TROCAR XCEL NON-BLD 5MMX100MML (ENDOMECHANICALS) ×2 IMPLANT
TUBING INSUFFLATION (TUBING) ×2 IMPLANT

## 2015-03-16 NOTE — Op Note (Signed)
OPERATIVE REPORT  DATE OF OPERATION:  03/16/2015  PATIENT:  Ashlee Williams  59 y.o. female  PRE-OPERATIVE DIAGNOSIS:  Symptomatic Cholelithiasis  POST-OPERATIVE DIAGNOSIS:  Symptomatic Cholelithiasis  PROCEDURE:  Procedure(s): LAPAROSCOPIC CHOLECYSTECTOMY  SURGEON:  Surgeon(s): Jimmye NormanJames Cleve Paolillo, MD  ASSISTANOrson Slick: Bowman, RNFA  ANESTHESIA:   general  EBL: <20 ml  BLOOD ADMINISTERED: none  DRAINS: none   SPECIMEN:  Source of Specimen:  Gallbladder and contents  COUNTS CORRECT:  YES  PROCEDURE DETAILS: The patient was taken to the operating room and placed on the table in the supine position.  After an adequate endotracheal anesthetic was administered, the patient was prepped with ChloroPrep, and then draped in the usual manner exposing the entire abdomen laterally, inferiorly and up  to the costal margins.  After a proper timeout was performed including identifying the patient and the procedure to be performed, a supraumbilical 1.5cm midline incision was made using a #15 blade.  This was taken down to the fascia which was then incised with a #15 blade.  The edges of the fascia were tented up with Kocher clamps as the preperitoneal space was penetrated with a Kelly clamp into the peritoneum.  Once this was done, a pursestring suture of 0 Vicryl was passed around the fascial opening.  This was subsequently used to secure the [facility] cannula which was passed into the peritoneal cavity.   Once the [facility] in [facility], carbon dioxide gas was insufflated into the peritoneal cavity up to a maximal intra-abdominal pressure of 15mm Hg.   Upon entering the peritoneal cavity with the laparoscope, there was omentum wrapped around the scope which impeded clear visualization of the right upper abdominal area.  One of the right sided 5mm cannulas was placed under direct vision, then the subxiphoid cannula placed.  The camera was passed through the RUQ cannula and were were able to dissect away the  omental adhesions to the abdominal wall around the cannula.  There was no attached bowel that could be seen.  Once this area was cleared the last RUQ cannula was placed under direct vision  Once all cannulas were in place, the dissection was begun.  Two ratcheted graspers were attached to the dome and infundibulum of the gallbladder and retracted towards the anterior abdominal wall and the right upper quadrant.  Using cautery attached to a dissecting forceps, the peritoneum overlaying the triangle of Chalot and the hepatoduodenal triangle was dissected away exposing the cystic duct and the cystic artery.  A critical window was developed between the CBD and the cystic duct The cystic artery was clipped proximally and distally then transected.  A clip was placed on the gallbladder side of the cystic duct, then the distal cystic duct was clipped multiple times then transected between the clips.  The gallbladder was then dissected out of the hepatic bed without event.  It was retrieved from the abdomen (using an EndoCatch bag) without event.  Once the gallbladder was removed, the bed was inspected for hemostasis.  Once excellent hemostasis was obtained all gas and fluids were aspirated from above the liver, then the cannulas were removed.  The supraumbilical incision was closed using the pursestring suture which was in place.  0.25% bupivicaine with epinephrine was injected at all sites.  All 10mm or greater cannula sites were close using a running subcuticular stitch of 4-0 Monocryl.  5.830mm cannula sites were closed with Dermabond only.Steri-Strips and Tagaderm were used to complete the dressings at all sites.  At this  point all needle, sponge, and instrument counts were correct.The patient was awakened from anesthesia and taken to the PACU in stable condition.  PATIENT DISPOSITION:  PACU - hemodynamically stable.   [name] 12/7/20163:46 PM

## 2015-03-16 NOTE — Progress Notes (Signed)
I happened to operate on this patient's daughter.  She has symptomatic cholelithiasis and needs her gallbladder removed.  Marta LamasJames O. Gae BonWyatt, III, MD, FACS [phone]--pager [phone]--office [facility]

## 2015-03-16 NOTE — Anesthesia Procedure Notes (Signed)
Procedure Name: Intubation Date/Time: 03/16/2015 2:58 PM Performed by: Fabian NovemberSOLHEIM, Devondre Guzzetta SALOMAN Pre-anesthesia Checklist: Patient identified, Patient being monitored, Timeout performed, Emergency Drugs available and Suction available Patient Re-evaluated:Patient Re-evaluated prior to inductionOxygen Delivery Method: Circle System Utilized Preoxygenation: Pre-oxygenation with 100% oxygen Intubation Type: IV induction, Rapid sequence and Cricoid Pressure applied Laryngoscope Size: Miller and 3 Grade View: Grade II Tube type: Oral Tube size: 7.5 mm Number of attempts: 1 Airway Equipment and Method: Stylet and Patient positioned with wedge pillow Placement Confirmation: ETT inserted through vocal cords under direct vision,  positive ETCO2 and breath sounds checked- equal and bilateral Secured at: 23 cm Tube secured with: Tape Dental Injury: Teeth and Oropharynx as per pre-operative assessment

## 2015-03-16 NOTE — H&P (Signed)
Ashlee Williams is an 59 y.o. female.   Chief Complaint: Right upper quadrant abdominal pain HPI: This is a pleasant female with known cholelithiasis. She saw Dr. Redmond Pulling in our office in August. At that time, a laparoscopic cholecystectomy was recommended. The patient, however, decided to hold off apparently for insurance reasons. She has had on and off discomfort since then. Over the last several days, she has had worsening right upper quadrant abdominal pain with occasional nausea and vomiting. The pain is sharp and moderate in intensity. Again, it is intermittent. She is otherwise without complaints. She denies fevers or chills. Bowel movements are normal  Past Medical History  Diagnosis Date  . Asthma   . Allergy   . Hypertension   . Blood in stool   . Chronic headaches   . Chicken pox   . Bronchitis   . Diabetes mellitus     PT. IS TRYING TO CONTROL WITH DIET BUT AS NOTED WEIGHT IS UP  . Tobacco abuse   . Obesity   . AAA (abdominal aortic aneurysm) ([place])   . Anemia     Past Surgical History  Procedure Laterality Date  . Abdominal hysterectomy  1998    Family History  Problem Relation Age of Onset  . Hypertension Mother   . Diabetes Sister   . Hypertension Sister   . Cancer Maternal Aunt   . Hypertension Maternal Aunt   . Stroke Maternal Grandmother   . Hypertension Maternal Grandmother   . Colon cancer Maternal Grandmother 85  . Cancer Maternal Grandfather   . Hypertension Maternal Grandfather   . Hypertension Father   . Hypertension Brother   . Hypertension Maternal Uncle   . Hypertension Paternal Aunt   . Hypertension Paternal Uncle   . Hypertension Paternal Grandmother   . Hypertension Paternal Grandfather    Social History:  reports that she quit smoking about 15 years ago. Her smoking use included Cigarettes. She has a 5 pack-year smoking history. She has never used smokeless tobacco. She reports that she drinks alcohol. She reports that she does not use  illicit drugs.  Allergies: No Known Allergies  Medications Prior to Admission  Medication Sig Dispense Refill  . albuterol (PROAIR HFA) 108 (90 BASE) MCG/ACT inhaler Inhale 2 puffs into the lungs every 4 (four) hours as needed for wheezing or shortness of breath. 1 Inhaler 6  . amLODipine (NORVASC) 2.5 MG tablet Take 1 tablet (2.5 mg total) by mouth daily. 30 tablet 6  . APAP-Isometheptene-Dichloral (MIDRIN) 325-65-100 MG CAPS Take 1 capsule by mouth every 6 (six) hours as needed.     Marland Kitchen atorvastatin (LIPITOR) 10 MG tablet Take 1 tablet (10 mg total) by mouth daily. 30 tablet 3  . calcium-vitamin D (CALCIUM 500/D) 500-200 MG-UNIT per tablet Take 1 tablet by mouth.    . cetirizine (ZYRTEC) 10 MG tablet Take 10 mg by mouth daily.    Marland Kitchen HYDROcodone-acetaminophen (NORCO/VICODIN) 5-325 MG per tablet Take 1-2 tablets by mouth every 6 (six) hours as needed. 10 tablet 0  . Multiple Vitamins-Minerals (MULTIVITAMIN WITH MINERALS) tablet Take 1 tablet by mouth daily.    . ondansetron (ZOFRAN ODT) 4 MG disintegrating tablet Take 1 tablet (4 mg total) by mouth every 8 (eight) hours as needed for nausea or vomiting. 20 tablet 0  . valsartan-hydrochlorothiazide (DIOVAN-HCT) 320-12.5 MG per tablet Take 1 tablet by mouth daily. 30 tablet 3    Results for orders placed or performed during the hospital encounter of 03/15/15 (from  the past 48 hour(s))  Urinalysis, Routine w reflex microscopic (not at [facility])     Status: None   Collection Time: 03/15/15 10:46 PM  Result Value Ref Range   Color, Urine YELLOW YELLOW   APPearance CLEAR CLEAR   Specific Gravity, Urine 1.020 1.005 - 1.030   pH 7.0 5.0 - 8.0   Glucose, UA NEGATIVE NEGATIVE mg/dL   Hgb urine dipstick NEGATIVE NEGATIVE   Bilirubin Urine NEGATIVE NEGATIVE   Ketones, ur NEGATIVE NEGATIVE mg/dL   Protein, ur NEGATIVE NEGATIVE mg/dL   Nitrite NEGATIVE NEGATIVE   Leukocytes, UA NEGATIVE NEGATIVE    Comment: MICROSCOPIC NOT DONE ON URINES WITH NEGATIVE  PROTEIN, BLOOD, LEUKOCYTES, NITRITE, OR GLUCOSE <1000 mg/dL.  Lipase, blood     Status: None   Collection Time: 03/15/15 10:49 PM  Result Value Ref Range   Lipase 22 11 - 51 U/L  Comprehensive metabolic panel     Status: Abnormal   Collection Time: 03/15/15 10:49 PM  Result Value Ref Range   Sodium 143 135 - 145 mmol/L   Potassium 3.9 3.5 - 5.1 mmol/L   Chloride 107 101 - 111 mmol/L   CO2 27 22 - 32 mmol/L   Glucose, Bld 124 (H) 65 - 99 mg/dL   BUN 11 6 - 20 mg/dL   Creatinine, Ser 0.88 0.44 - 1.00 mg/dL   Calcium 9.3 8.9 - 10.3 mg/dL   Total Protein 7.2 6.5 - 8.1 g/dL   Albumin 3.3 (L) 3.5 - 5.0 g/dL   AST 21 15 - 41 U/L   ALT 27 14 - 54 U/L   Alkaline Phosphatase 66 38 - 126 U/L   Total Bilirubin 0.4 0.3 - 1.2 mg/dL   GFR calc non Af Amer >60 >60 mL/min   GFR calc Af Amer >60 >60 mL/min    Comment: (NOTE) The eGFR has been calculated using the CKD EPI equation. This calculation has not been validated in all clinical situations. eGFR's persistently <60 mL/min signify possible Chronic Kidney Disease.    Anion gap 9 5 - 15  CBC     Status: None   Collection Time: 03/15/15 10:49 PM  Result Value Ref Range   WBC 8.1 4.0 - 10.5 K/uL   RBC 4.81 3.87 - 5.11 MIL/uL   Hemoglobin 13.1 12.0 - 15.0 g/dL   HCT 40.7 36.0 - 46.0 %   MCV 84.6 78.0 - 100.0 fL   MCH 27.2 26.0 - 34.0 pg   MCHC 32.2 30.0 - 36.0 g/dL   RDW 15.0 11.5 - 15.5 %   Platelets 212 150 - 400 K/uL  Pregnancy, urine     Status: None   Collection Time: 03/16/15 12:10 AM  Result Value Ref Range   Preg Test, Ur NEGATIVE NEGATIVE    Comment:        THE SENSITIVITY OF THIS METHODOLOGY IS >20 mIU/mL.    US Abdomen Complete  03/16/2015  CLINICAL DATA:  59 year old female with abdominal pain radiating to the back. History of abdominal aortic aneurysm and gallstones. EXAM: ULTRASOUND ABDOMEN COMPLETE COMPARISON:  Ultrasound dated 11/16/2014 FINDINGS: Evaluation is limited due to patient's body habitus. Gallbladder:  There small stones within the gallbladder. There is no gallbladder wall thickening or pericholecystic fluid. No sonographic Murphy's sign. Common bile duct: Diameter: 3 mm Liver: Mildly heterogeneous.  In IVC: No abnormality visualized. Pancreas: Not visualized Spleen: Size and appearance within normal limits. Right Kidney: Length: 11.4 cm. Echogenicity within normal limits. No mass or hydronephrosis visualized.  Left Kidney: Length: 11.5 cm. Echogenicity within normal limits. No mass or hydronephrosis visualized. Abdominal aorta: The abdominal aorta is not well visualized. The aorta measures up to 3 cm in diameter proximally. Other findings: None. IMPRESSION: Cholelithiasis without sonographic evidence of acute cholecystitis. Mild aneurysmal dilatation of the proximal aorta measuring up to 3 cm in diameter. Follow-up is recommended. Electronically Signed   By: Anner Crete M.D.   On: 03/16/2015 01:38    Review of Systems  All other systems reviewed and are negative.   Blood pressure 127/83, pulse 63, temperature 97.7 F (36.5 C), temperature source Oral, resp. rate 18, height _0  (1.6 m), weight 124.921 kg (275 lb 6.4 oz), last menstrual period 11/10/2010, SpO2 100 %. Physical Exam  Constitutional: She is oriented to person, place, and time. She appears well-developed and well-nourished. No distress.  Morbidly obese  HENT:  Head: Normocephalic and atraumatic.  Right Ear: External ear normal.  Left Ear: External ear normal.  Mouth/Throat: No oropharyngeal exudate.  Eyes: Conjunctivae are normal. Pupils are equal, round, and reactive to light. No scleral icterus.  Neck: Normal range of motion. No tracheal deviation present.  Cardiovascular: Normal rate, regular rhythm, normal heart sounds and intact distal pulses.   No murmur heard. Respiratory: Effort normal and breath sounds normal. No respiratory distress. She has no wheezes.  GI: Soft. She exhibits no distension. There is tenderness.  There is no guarding.  Abdomen is obese and soft. There is mild tenderness with minimal guarding in the right upper quadrant  Musculoskeletal: Normal range of motion. She exhibits no edema or tenderness.  Lymphadenopathy:    She has no cervical adenopathy.  Neurological: She is alert and oriented to person, place, and time.  Skin: Skin is warm. She is not diaphoretic. No erythema.  Psychiatric: Her behavior is normal. Judgment normal.     Assessment/Plan:  Symptomatic cholelithiasis  She is being admitted for eventual laparoscopic cholecystectomy.  I discussed the diagnosis with her in detail and she understands. I discussed the risk of surgery with her. This will most likely be performed by Dr. Hulen Skains. Surgery will be scheduled for hopefully today and if not then tomorrow.  Romayne Ticas A 03/16/2015, 6:00 AM

## 2015-03-16 NOTE — Care Management Note (Signed)
Case Management Note  Patient Details  Name: Ashlee Williams MRN: [id number] Date of Birth: 03/19/1956  Subjective/Objective:   Patient admitted with Symptomatic Cholelithiasis. Patient is from home with friends.                  Action/Plan: Plan is for patient to have surgery today. CM will continue to follow for discharge needs.   Expected Discharge Date:                  Expected Discharge Plan:     In-House Referral:     Discharge planning Services     Post Acute Care Choice:    Choice offered to:     DME Arranged:    DME Agency:     HH Arranged:    HH Agency:     Status of Service:  In process, will continue to follow  Medicare Important Message Given:    Date Medicare IM Given:    Medicare IM give by:    Date Additional Medicare IM Given:    Additional Medicare Important Message give by:     If discussed at Long Length of Stay Meetings, dates discussed:    Additional Comments:  Kermit BaloKelli F Nathanial Arrighi, RN 03/16/2015, 11:55 AM

## 2015-03-16 NOTE — Anesthesia Preprocedure Evaluation (Signed)
Anesthesia Evaluation  Patient identified by MRN, date of birth, ID band Patient awake    Reviewed: Allergy & Precautions, NPO status , Patient's Chart, lab work & pertinent test results  Airway Mallampati: III  TM Distance: <3 FB Neck ROM: Full    Dental no notable dental hx.    Pulmonary neg pulmonary ROS, former smoker,    Pulmonary exam normal breath sounds clear to auscultation       Cardiovascular hypertension, Pt. on medications + Peripheral Vascular Disease  Normal cardiovascular exam Rhythm:Regular Rate:Normal     Neuro/Psych negative neurological ROS  negative psych ROS   GI/Hepatic negative GI ROS, Neg liver ROS,   Endo/Other  diabetes  Renal/GU negative Renal ROS  negative genitourinary   Musculoskeletal negative musculoskeletal ROS (+)   Abdominal (+) + obese,   Peds negative pediatric ROS (+)  Hematology negative hematology ROS (+)   Anesthesia Other Findings   Reproductive/Obstetrics negative OB ROS                             Anesthesia Physical Anesthesia Plan  ASA: III  Anesthesia Plan: General   Post-op Pain Management:    Induction: Intravenous  Airway Management Planned: Oral ETT  Additional Equipment:   Intra-op Plan:   Post-operative Plan: Extubation in OR  Informed Consent: I have reviewed the patients History and Physical, chart, labs and discussed the procedure including the risks, benefits and alternatives for the proposed anesthesia with the patient or authorized representative who has indicated his/her understanding and acceptance.   Dental advisory given  Plan Discussed with: CRNA and Surgeon  Anesthesia Plan Comments:         Anesthesia Quick Evaluation

## 2015-03-16 NOTE — Transfer of Care (Signed)
Immediate Anesthesia Transfer of Care Note  Patient: Ashlee Williams  Procedure(s) Performed: Procedure(s): LAPAROSCOPIC CHOLECYSTECTOMY (N/A)  Patient Location: PACU  Anesthesia Type:General  Level of Consciousness: awake, alert , oriented and patient cooperative  Airway & Oxygen Therapy: Patient Spontanous Breathing and Patient connected to face mask oxygen  Post-op Assessment: Report given to RN and Post -op Vital signs reviewed and stable  Post vital signs: Reviewed and stable  Last Vitals:  Filed Vitals:   03/16/15 0942 03/16/15 1559  BP: 136/74 134/78  Pulse: 52 72  Temp: 36.6 C 36.8 C  Resp: 20 20    Complications: No apparent anesthesia complications

## 2015-03-17 ENCOUNTER — Encounter (HOSPITAL_COMMUNITY): Payer: Self-pay | Admitting: General Surgery

## 2015-03-17 MED ORDER — OXYCODONE-ACETAMINOPHEN 5-325 MG PO TABS: 1.0000 | ORAL_TABLET | ORAL | Status: DC | PRN

## 2015-03-17 NOTE — Discharge Instructions (Signed)
[facility] ______[facility] °LAPAROSCOPIC SURGERY: POST OP INSTRUCTIONS °Always review your discharge instruction sheet given to you by the facility where your surgery was performed. °IF YOU HAVE DISABILITY OR FAMILY LEAVE FORMS, YOU MUST BRING THEM TO THE OFFICE FOR PROCESSING.   °DO NOT GIVE THEM TO YOUR DOCTOR. ° °1. A prescription for pain medication may be given to you upon discharge.  Take your pain medication as prescribed, if needed.  If narcotic pain medicine is not needed, then you may take acetaminophen (Tylenol) or ibuprofen (Advil) as needed. °2. Take your usually prescribed medications unless otherwise directed. °3. If you need a refill on your pain medication, please contact your pharmacy.  They will contact our office to request authorization. Prescriptions will not be filled after 5pm or on week-ends. °4. You should follow a light diet the first few days after arrival home, such as soup and crackers, etc.  Be sure to include lots of fluids daily. °5. Most patients will experience some swelling and bruising in the area of the incisions.  Ice packs will help.  Swelling and bruising can take several days to resolve.  °6. It is common to experience some constipation if taking pain medication after surgery.  Increasing fluid intake and taking a stool softener (such as Colace) will usually help or prevent this problem from occurring.  A mild laxative (Milk of Magnesia or Miralax) should be taken according to package instructions if there are no bowel movements after 48 hours. °7. Unless discharge instructions indicate otherwise, you may remove your bandages 24-48 hours after surgery, and you may shower at that time.  You may have steri-strips (small skin tapes) in place directly over the incision.  These strips should be left on the skin for 7-10 days.  If your surgeon used skin glue on the incision, you may shower in 24 hours.  The glue will flake off over the next 2-3 weeks.  Any sutures or  staples will be removed at the office during your follow-up visit. °8. ACTIVITIES:  You may resume regular (light) daily activities beginning the next day--such as daily self-care, walking, climbing stairs--gradually increasing activities as tolerated.  You may have sexual intercourse when it is comfortable.  Refrain from any heavy lifting or straining until approved by your doctor. °a. You may drive when you are no longer taking prescription pain medication, you can comfortably wear a seatbelt, and you can safely maneuver your car and apply brakes. °b. RETURN TO WORK:  __________________________________________________________ °9. You should see your doctor in the office for a follow-up appointment approximately 2-3 weeks after your surgery.  Make sure that you call for this appointment within a day or two after you arrive home to insure a convenient appointment time. °10. OTHER INSTRUCTIONS: __________________________________________________________________________________________________________________________ __________________________________________________________________________________________________________________________ °WHEN TO CALL YOUR DOCTOR: °1. Fever over 101.0 °2. Inability to urinate °3. Continued bleeding from incision. °4. Increased pain, redness, or drainage from the incision. °5. Increasing abdominal pain ° °The clinic staff is available to answer your questions during regular business hours.  Please don’t hesitate to call and ask to speak to one of the nurses for clinical concerns.  If you have a medical emergency, go to the nearest emergency room or call 911.  A surgeon from [facility] is always on call at the hospital. °[address], [address], [place], [place]  [address] ? [address] [address], [place], [place]   [address] °[phone] ? [phone] ? FAX [phone] °Web site:   [email or web address] °

## 2015-03-17 NOTE — Care Management Note (Signed)
Case Management Note  Patient Details  Name: Caren GriffinsStanley A Mulvaney MRN: [id number] Date of Birth: Oct 04, 1955  Subjective/Objective:                    Action/Plan: Patient being discharged home today with self care. No further needs per CM.   Expected Discharge Date:                  Expected Discharge Plan:     In-House Referral:     Discharge planning Services     Post Acute Care Choice:    Choice offered to:     DME Arranged:    DME Agency:     HH Arranged:    HH Agency:     Status of Service:  In process, will continue to follow  Medicare Important Message Given:    Date Medicare IM Given:    Medicare IM give by:    Date Additional Medicare IM Given:    Additional Medicare Important Message give by:     If discussed at Long Length of Stay Meetings, dates discussed:    Additional Comments:  Kermit BaloKelli F Anysia Choi, RN 03/17/2015, 10:01 AM

## 2015-03-17 NOTE — Discharge Summary (Signed)
Patient ID: Ashlee Williams MRN: [id number] DOB/AGE: [birth year] 58 y.o.  Admit date: 03/15/2015 Discharge date: 03/17/2015  Procedures: lap chole  Consults: None  Reason for Admission:  This is a pleasant female with known cholelithiasis. She saw Dr. Andrey CampanileWilson in our office in August. At that time, a laparoscopic cholecystectomy was recommended. The patient, however, decided to hold off apparently for insurance reasons. She has had on and off discomfort since then. Over the last several days, she has had worsening right upper quadrant abdominal pain with occasional nausea and vomiting. The pain is sharp and moderate in intensity. Again, it is intermittent. She is otherwise without complaints. She denies fevers or chills. Bowel movements are normal  Admission Diagnoses:  1. Biliary colic  Hospital Course: The patient was admitted and taken to the OR where she underwent a lap chole.  She tolerated this procedure well.  She was taking oral pain meds and tolerating a solid diet on POD 1.  She was otherwise stable for DC home.  PE: Abd: soft, morbidly obese, appropriately tender, +BS, incisions c/d/i with dermabond, steris, and tegaderms in place  Discharge Diagnoses:  Active Problems:   Symptomatic cholelithiasis s/p lap chole  Discharge Medications:   Medication List    STOP taking these medications        HYDROcodone-acetaminophen 5-325 MG tablet  Commonly known as:  NORCO/VICODIN      TAKE these medications        albuterol 108 (90 BASE) MCG/ACT inhaler  Commonly known as:  PROAIR HFA  Inhale 2 puffs into the lungs every 4 (four) hours as needed for wheezing or shortness of breath.     amLODipine 2.5 MG tablet  Commonly known as:  NORVASC  Take 1 tablet (2.5 mg total) by mouth daily.     atorvastatin 10 MG tablet  Commonly known as:  LIPITOR  Take 1 tablet (10 mg total) by mouth daily.     CALCIUM 500/D 500-200 MG-UNIT tablet  Generic drug:  calcium-vitamin D  Take 1  tablet by mouth.     cetirizine 10 MG tablet  Commonly known as:  ZYRTEC  Take 10 mg by mouth daily.     MIDRIN 65-100-325 MG capsule  Generic drug:  isometheptene-acetaminophen-dichloralphenazone  Take 1 capsule by mouth every 6 (six) hours as needed.     multivitamin with minerals tablet  Take 1 tablet by mouth daily.     ondansetron 4 MG disintegrating tablet  Commonly known as:  ZOFRAN ODT  Take 1 tablet (4 mg total) by mouth every 8 (eight) hours as needed for nausea or vomiting.     oxyCODONE-acetaminophen 5-325 MG tablet  Commonly known as:  PERCOCET/ROXICET  Take 1-2 tablets by mouth every 4 (four) hours as needed for moderate pain.     valsartan-hydrochlorothiazide 320-12.5 MG tablet  Commonly known as:  DIOVAN-HCT  Take 1 tablet by mouth daily.        Discharge Instructions:     Follow-up Information    Follow up with LIEPINS, ANDY, PA-C On 04/06/2015.   Specialty:  Surgery   Why:  [facility], 9:00[facility], arrive no later than 8:30am for paperwork   Contact information:   [address] [place] [address] [phone]       Signed: Letha CapeOSBORNE,Malacai Grantz E 03/17/2015, 8:30 AM

## 2015-03-18 ENCOUNTER — Ambulatory Visit: Payer: 59

## 2015-03-23 NOTE — Anesthesia Postprocedure Evaluation (Signed)
Anesthesia Post Note  Patient: Ashlee Williams  Procedure(s) Performed: Procedure(s) (LRB): LAPAROSCOPIC CHOLECYSTECTOMY (N/A)  Patient location during evaluation: PACU Anesthesia Type: General Level of consciousness: awake and alert Pain management: pain level controlled Vital Signs Assessment: post-procedure vital signs reviewed and stable Respiratory status: spontaneous breathing, nonlabored ventilation, respiratory function stable and patient connected to nasal cannula oxygen Cardiovascular status: blood pressure returned to baseline and stable Postop Assessment: no signs of nausea or vomiting Anesthetic complications: no    Last Vitals:  Filed Vitals:   03/17/15 0500 03/17/15 1004  BP: 152/82 137/74  Pulse: 65 61  Temp: 36.8 C 37 C  Resp: 18 20    Last Pain:  Filed Vitals:   03/17/15 1004  PainSc: 8                  Archibald Marchetta,JAMES TERRILL

## 2015-04-01 ENCOUNTER — Other Ambulatory Visit: Payer: Self-pay | Admitting: Family Medicine

## 2015-04-01 NOTE — Telephone Encounter (Signed)
Medication filled to pharmacy as requested.   

## 2015-04-18 ENCOUNTER — Ambulatory Visit
Admission: RE | Admit: 2015-04-18 | Discharge: 2015-04-18 | Disposition: A | Discharge status: Home / Self Care | Payer: 59 | Source: Ambulatory Visit

## 2015-04-18 DIAGNOSIS — Z1231 Encounter for screening mammogram for malignant neoplasm of breast: Secondary | ICD-10-CM

## 2015-05-12 MED FILL — ATORVASTATIN 10 MG TABLET: 10 | 30 days supply | Qty: 30 | Fill #1

## 2015-05-12 MED FILL — VALSARTAN-HCTZ 320-12.5 MG: 320-12.5 | 30 days supply | Qty: 30 | Fill #1

## 2015-05-13 MED FILL — AMLODIPINE BESYLATE 2.5 MG: 2.5 | 30 days supply | Qty: 30 | Fill #2

## 2015-05-24 ENCOUNTER — Telehealth: Payer: Self-pay | Admitting: *Deleted

## 2015-05-24 ENCOUNTER — Encounter: Payer: Self-pay | Admitting: *Deleted

## 2015-05-24 NOTE — Telephone Encounter (Signed)
Pre-Visit Call completed with patient and chart updated.   Pre-Visit Info documented in Specialty Comments under [employer].    

## 2015-05-25 ENCOUNTER — Encounter: Payer: Self-pay | Admitting: Family Medicine

## 2015-05-25 ENCOUNTER — Ambulatory Visit (INDEPENDENT_AMBULATORY_CARE_PROVIDER_SITE_OTHER): Payer: 59 | Admitting: Family Medicine

## 2015-05-25 VITALS — BP 139/86 | HR 81 | Temp 98.2°F | Ht 63.0 in | Wt 274.6 lb

## 2015-05-25 DIAGNOSIS — J0100 Acute maxillary sinusitis, unspecified: Secondary | ICD-10-CM | POA: Diagnosis not present

## 2015-05-25 DIAGNOSIS — E119 Type 2 diabetes mellitus without complications: Secondary | ICD-10-CM

## 2015-05-25 DIAGNOSIS — Z0000 Encounter for general adult medical examination without abnormal findings: Secondary | ICD-10-CM | POA: Diagnosis not present

## 2015-05-25 MED ORDER — PROMETHAZINE-DM 6.25-15 MG/5ML PO SYRP: 5.0000 mL | ORAL_SOLUTION | Freq: Four times a day (QID) | ORAL | Status: DC | PRN

## 2015-05-25 MED ORDER — AMOXICILLIN 875 MG PO TABS: 875.0000 mg | ORAL_TABLET | Freq: Two times a day (BID) | ORAL | Status: DC

## 2015-05-25 MED FILL — PROMETHAZINE-DM SYRUP: 6.25-15 | 12 days supply | Qty: 240 | Fill #0

## 2015-05-25 MED FILL — AMOXICILLIN 875 MG TABLET: 875 | 10 days supply | Qty: 20 | Fill #0

## 2015-05-25 NOTE — Assessment & Plan Note (Signed)
Pt's sxs and PE consistent w/ infxn.  Start abx.  Cough meds prn.  Reviewed supportive care and red flags that should prompt return.  Pt expressed understanding and is in agreement w/ plan.  

## 2015-05-25 NOTE — Progress Notes (Signed)
Pre visit review using our clinic review tool, if applicable. No additional management support is needed unless otherwise documented below in the visit note. 

## 2015-05-25 NOTE — Patient Instructions (Signed)
Follow up in 3-4 months to recheck BP and sugar We'll notify you of your lab results and make any changes if needed Continue to work on healthy diet and regular exercise- you can do it! Ask the pharmacy about patches to quit smoking You are up to date on mammo and colonoscopy- love it! Start the Amoxicillin twice daily for the sinus infection- take w/ food Use the cough syrup as needed- will cause drowsiness Mucinex DM for daytime cough! Call with any questions or concerns If you want to join [place] at the new [place] office, any scheduled appointments will automatically transfer and we will see you at [address] [place] [address] [place], [place], [place] [address] [facility] [place]) [place] in there!!!

## 2015-05-25 NOTE — Progress Notes (Signed)
   Subjective:    Patient ID: Ashlee Williams, female    DOB: November 21, 1955, 60 y.o.   MRN: [id number]  HPI CPE- UTD on mammo, colonoscopy (colonoscopy).  Due for eye exam.  URi- pt reports increased chest congestion.  Hx of chronic bronchitis.  sxs started last week.  No fevers.  + SOB.  Cough is productive.  + sick contacts.  + maxillary sinus pressure.  Using Albuterol inhaler.   Review of Systems Patient reports no vision/ hearing changes, adenopathy,fever, weight change,  persistant/recurrent hoarseness , swallowing issues, chest pain, palpitations, edema, persistant/recurrent cough, hemoptysis, dyspnea (rest/exertional/paroxysmal nocturnal), gastrointestinal bleeding (melena, rectal bleeding), abdominal pain, significant heartburn, bowel changes, GU symptoms (dysuria, hematuria, incontinence), Gyn symptoms (abnormal  bleeding, pain),  syncope, focal weakness, memory loss, numbness & tingling, skin/hair/nail changes, abnormal bruising or bleeding, anxiety, or depression.     Objective:   Physical Exam General Appearance:    Alert, cooperative, no distress, appears stated age, obese  Head:    Normocephalic, without obvious abnormality, atraumatic  Eyes:    PERRL, conjunctiva/corneas clear, EOM's intact, fundi    benign, both eyes  Ears:    Normal TM's and external ear canals, both ears  Nose:   Bilateral turbinate edema w/ TTP over frontal and maxillary sinuses  Throat:   Lips, mucosa, and tongue normal; teeth and gums normal  Neck:   Supple, symmetrical, trachea midline, no adenopathy;    Thyroid: no enlargement/tenderness/nodules  Back:     Symmetric, no curvature, ROM normal, no CVA tenderness  Lungs:     Clear to auscultation bilaterally, respirations unlabored  Chest Wall:    No tenderness or deformity   Heart:    Regular rate and rhythm, S1 and S2 normal, no murmur, rub   or gallop  Breast Exam:    Deferred to mammo  Abdomen:     Soft, non-tender, bowel sounds active all four  quadrants,    no masses, no organomegaly  Genitalia:    Deferred  Rectal:    Extremities:   Extremities normal, atraumatic, no cyanosis or edema  Pulses:   2+ and symmetric all extremities  Skin:   Skin color, texture, turgor normal, no rashes or lesions  Lymph nodes:   Cervical, supraclavicular, and axillary nodes normal  Neurologic:   CNII-XII intact, normal strength, sensation and reflexes    throughout          Assessment & Plan:

## 2015-05-25 NOTE — Assessment & Plan Note (Signed)
Pt's PE WNL w/ exception of obesity and sinusitis.  UTD on mammo, colonoscopy, immunizations.  Check labs.  Anticipatory guidance provided.

## 2015-05-25 NOTE — Assessment & Plan Note (Signed)
Ongoing issue for pt.  Stressed need for healthy diet and regular exercise.  Check labs to risk stratify.  Will follow 

## 2015-05-25 NOTE — Assessment & Plan Note (Signed)
Chronic problem.  On ARB for renal protection.  Due for eye exam- encouraged her to schedule.  Check labs.  Start meds prn.

## 2015-05-26 ENCOUNTER — Other Ambulatory Visit: Payer: Self-pay | Admitting: General Practice

## 2015-05-26 LAB — BASIC METABOLIC PANEL
BUN: 15 mg/dL (ref 6–23)
CO2: 30 meq/L (ref 19–32)
Calcium: 9.4 mg/dL (ref 8.4–10.5)
Chloride: 103 mEq/L (ref 96–112)
Creatinine, Ser: 0.93 mg/dL (ref 0.40–1.20)
GFR: 79.5 mL/min (ref >60.00)
GLUCOSE: 81 mg/dL (ref 70–99)
POTASSIUM: 3.9 meq/L (ref 3.5–5.1)
SODIUM: 140 meq/L (ref 135–145)

## 2015-05-26 LAB — HEMOGLOBIN A1C: Hgb A1c MFr Bld: 6.5 % (ref 4.6–6.5)

## 2015-05-26 LAB — LIPID PANEL
CHOL/HDL RATIO: 2
CHOLESTEROL: 81 mg/dL (ref 0–200)
HDL: 40.3 mg/dL (ref >39.00)
LDL CALC: 27 mg/dL (ref 0–99)
NonHDL: 40.59
TRIGLYCERIDES: 66 mg/dL (ref 0.0–149.0)
VLDL: 13.2 mg/dL (ref 0.0–40.0)

## 2015-05-26 LAB — HEPATIC FUNCTION PANEL
ALBUMIN: 3.8 g/dL (ref 3.5–5.2)
ALK PHOS: 57 U/L (ref 39–117)
ALT: 20 U/L (ref 0–35)
AST: 21 U/L (ref 0–37)
BILIRUBIN DIRECT: 0.1 mg/dL (ref 0.0–0.3)
TOTAL PROTEIN: 7.5 g/dL (ref 6.0–8.3)
Total Bilirubin: 0.3 mg/dL (ref 0.2–1.2)

## 2015-05-26 LAB — CBC WITH DIFFERENTIAL/PLATELET
Basophils Absolute: 0.1 10*3/uL (ref 0.0–0.1)
Basophils Relative: 0.8 % (ref 0.0–3.0)
Eosinophils Absolute: 0.3 10*3/uL (ref 0.0–0.7)
Eosinophils Relative: 3.6 % (ref 0.0–5.0)
HCT: 39.3 % (ref 36.0–46.0)
HEMOGLOBIN: 12.9 g/dL (ref 12.0–15.0)
LYMPHS ABS: 3.3 10*3/uL (ref 0.7–4.0)
Lymphocytes Relative: 47.3 % — ABNORMAL HIGH (ref 12.0–46.0)
MCHC: 32.8 g/dL (ref 30.0–36.0)
MCV: 80.7 fl (ref 78.0–100.0)
MONO ABS: 0.3 10*3/uL (ref 0.1–1.0)
Monocytes Relative: 4.9 % (ref 3.0–12.0)
NEUTROS PCT: 43.4 % (ref 43.0–77.0)
Neutro Abs: 3 10*3/uL (ref 1.4–7.7)
PLATELETS: 211 10*3/uL (ref 150.0–400.0)
RBC: 4.87 Mil/uL (ref 3.87–5.11)
RDW: 15.1 % (ref 11.5–15.5)
WBC: 7 10*3/uL (ref 4.0–10.5)

## 2015-05-26 LAB — VITAMIN D 25 HYDROXY (VIT D DEFICIENCY, FRACTURES): VITD: 24.5 ng/mL — ABNORMAL LOW (ref 30.00–100.00)

## 2015-05-26 LAB — TSH: TSH: 1 u[IU]/mL (ref 0.35–4.50)

## 2015-05-26 MED ORDER — VITAMIN D (ERGOCALCIFEROL) 1.25 MG (50000 UNIT) PO CAPS: 50000.0000 [IU] | ORAL_CAPSULE | ORAL | Status: DC

## 2015-05-26 MED FILL — VIT D2 1.25 MG (50,000 UNIT: 1.25 MG | 84 days supply | Qty: 12 | Fill #0

## 2015-06-21 ENCOUNTER — Other Ambulatory Visit: Payer: Self-pay | Admitting: Family Medicine

## 2015-06-21 MED FILL — ATORVASTATIN 10 MG TABLET: 10 | 30 days supply | Qty: 30 | Fill #2

## 2015-06-21 MED FILL — AMLODIPINE BESYLATE 2.5 MG: 2.5 | 30 days supply | Qty: 30 | Fill #0

## 2015-06-21 MED FILL — VALSARTAN-HCTZ 320-12.5 MG: 320-12.5 | 30 days supply | Qty: 30 | Fill #2

## 2015-06-21 NOTE — Telephone Encounter (Signed)
Medication filled to pharmacy as requested.   

## 2015-07-21 MED FILL — AMLODIPINE BESYLATE 2.5 MG: 2.5 | 30 days supply | Qty: 30 | Fill #1

## 2015-07-21 MED FILL — ATORVASTATIN 10 MG TABLET: 10 | 30 days supply | Qty: 30 | Fill #3

## 2015-07-21 MED FILL — VALSARTAN-HCTZ 320-12.5 MG: 320-12.5 | 30 days supply | Qty: 30 | Fill #3

## 2015-08-22 MED FILL — AMLODIPINE BESYLATE 2.5 MG: 2.5 | 30 days supply | Qty: 30 | Fill #2

## 2015-08-22 MED FILL — VALSARTAN-HCTZ 320-12.5 MG: 320-12.5 | 30 days supply | Qty: 30 | Fill #4

## 2015-08-22 MED FILL — ATORVASTATIN 10 MG TABLET: 10 | 30 days supply | Qty: 30 | Fill #4

## 2015-09-22 ENCOUNTER — Encounter: Payer: Self-pay | Admitting: Family Medicine

## 2015-09-22 ENCOUNTER — Ambulatory Visit (INDEPENDENT_AMBULATORY_CARE_PROVIDER_SITE_OTHER): Payer: 59 | Admitting: Family Medicine

## 2015-09-22 VITALS — BP 133/80 | HR 75 | Temp 98.0°F | Resp 16 | Ht 63.0 in | Wt 270.5 lb

## 2015-09-22 DIAGNOSIS — I10 Essential (primary) hypertension: Secondary | ICD-10-CM

## 2015-09-22 DIAGNOSIS — E119 Type 2 diabetes mellitus without complications: Secondary | ICD-10-CM

## 2015-09-22 LAB — HEMOGLOBIN A1C: Hgb A1c MFr Bld: 6.3 % (ref 4.6–6.5)

## 2015-09-22 LAB — BASIC METABOLIC PANEL
BUN: 20 mg/dL (ref 6–23)
CALCIUM: 9.1 mg/dL (ref 8.4–10.5)
CHLORIDE: 108 meq/L (ref 96–112)
CO2: 30 meq/L (ref 19–32)
CREATININE: 0.87 mg/dL (ref 0.40–1.20)
GFR: 85.77 mL/min (ref >60.00)
GLUCOSE: 88 mg/dL (ref 70–99)
Potassium: 4.3 mEq/L (ref 3.5–5.1)
Sodium: 142 mEq/L (ref 135–145)

## 2015-09-22 NOTE — Assessment & Plan Note (Signed)
Chronic problem.  Currently asymptomatic.  Foot exam done today.  Due for eye exam- referral placed.  On ARB for renal protection.  Applauded her recent weight loss and improved lifestyle choices.  Check labs.  Start meds prn.

## 2015-09-22 NOTE — Assessment & Plan Note (Signed)
Chronic problem.  Better control today w/ pt's improved lifestyle.  Applauded her recent weight loss and increased exercise and dietary changes.  Check labs.  No anticipated med changes.

## 2015-09-22 NOTE — Progress Notes (Signed)
   Subjective:    Patient ID: Ashlee Williams, female    DOB: 1956/01/23, 60 y.o.   MRN: [id number]  HPI HTN- chronic problem.  Better control today on Amlodipine and Valsartan HCTZ daily.  + HAs- consistent w/ sinus pressure.  No CP, SOB, visual changes, edema.  DM- diet controlled.  On ARB for renal protection.  Due for eye exam (needs referral) and foot exam.  Has lost 5 lbs since last visit.  Walking regularly, has almost eliminated sugary drinks by increasing water intake.  Denies numbness/tingling of hands/feet.   Review of Systems For ROS see HPI     Objective:   Physical Exam  Constitutional: She is oriented to person, place, and time. She appears well-developed and well-nourished. No distress.  HENT:  Head: Normocephalic and atraumatic.  Eyes: Conjunctivae and EOM are normal. Pupils are equal, round, and reactive to light.  Neck: Normal range of motion. Neck supple. No thyromegaly present.  Cardiovascular: Normal rate, regular rhythm, normal heart sounds and intact distal pulses.   No murmur heard. Pulmonary/Chest: Effort normal and breath sounds normal. No respiratory distress.  Abdominal: Soft. She exhibits no distension. There is no tenderness.  Musculoskeletal: She exhibits no edema.  Lymphadenopathy:    She has no cervical adenopathy.  Neurological: She is alert and oriented to person, place, and time.  Skin: Skin is warm and dry.  Psychiatric: She has a normal mood and affect. Her behavior is normal.  Vitals reviewed.         Assessment & Plan:

## 2015-09-22 NOTE — Patient Instructions (Signed)
Follow up in 3-4 months to recheck sugars We'll notify you of your lab results and make any changes if needed Keep up the good work on healthy diet and regular exercise- you're doing it!!! We'll call you with your eye exam Call with any questions or concerns Have a great summer!!!

## 2015-09-22 NOTE — Progress Notes (Signed)
Pre visit review using our clinic review tool, if applicable. No additional management support is needed unless otherwise documented below in the visit note. 

## 2015-09-23 ENCOUNTER — Encounter: Payer: Self-pay | Admitting: General Practice

## 2015-09-23 MED FILL — AMLODIPINE BESYLATE 2.5 MG: 2.5 | 30 days supply | Qty: 30 | Fill #3

## 2015-09-23 MED FILL — VALSARTAN-HCTZ 320-12.5 MG: 320-12.5 | 30 days supply | Qty: 30 | Fill #5

## 2015-09-23 MED FILL — ATORVASTATIN 10 MG TABLET: 10 | 30 days supply | Qty: 30 | Fill #5

## 2015-09-28 ENCOUNTER — Encounter: Payer: Self-pay | Admitting: Family Medicine

## 2015-10-25 DIAGNOSIS — H5203 Hypermetropia, bilateral: Secondary | ICD-10-CM | POA: Diagnosis not present

## 2015-10-25 LAB — HM DIABETES EYE EXAM

## 2015-10-25 MED FILL — ATORVASTATIN 10 MG TABLET: 10 | 30 days supply | Qty: 30 | Fill #6

## 2015-10-25 MED FILL — AMLODIPINE BESYLATE 2.5 MG: 2.5 | 30 days supply | Qty: 30 | Fill #4

## 2015-10-25 MED FILL — VALSARTAN-HCTZ 320-12.5 MG: 320-12.5 | 30 days supply | Qty: 30 | Fill #6

## 2015-10-31 ENCOUNTER — Encounter: Payer: Self-pay | Admitting: General Practice

## 2015-12-01 ENCOUNTER — Other Ambulatory Visit: Payer: Self-pay | Admitting: Family Medicine

## 2015-12-01 MED FILL — ATORVASTATIN 10 MG TABLET: 10 | 30 days supply | Qty: 30 | Fill #0

## 2015-12-01 MED FILL — VALSARTAN-HCTZ 320-12.5 MG: 320-12.5 | 30 days supply | Qty: 30 | Fill #0

## 2015-12-01 MED FILL — AMLODIPINE BESYLATE 2.5 MG: 2.5 | 30 days supply | Qty: 30 | Fill #5

## 2016-01-02 MED FILL — AMLODIPINE BESYLATE 2.5 MG: 2.5 | 30 days supply | Qty: 30 | Fill #6

## 2016-01-02 MED FILL — ATORVASTATIN 10 MG TABLET: 10 | 30 days supply | Qty: 30 | Fill #1

## 2016-01-02 MED FILL — VALSARTAN-HCTZ 320-12.5 MG: 320-12.5 | 30 days supply | Qty: 30 | Fill #1

## 2016-01-25 ENCOUNTER — Other Ambulatory Visit (INDEPENDENT_AMBULATORY_CARE_PROVIDER_SITE_OTHER): Payer: 59

## 2016-01-25 ENCOUNTER — Other Ambulatory Visit: Payer: Self-pay | Admitting: Family Medicine

## 2016-01-25 DIAGNOSIS — E119 Type 2 diabetes mellitus without complications: Secondary | ICD-10-CM

## 2016-01-25 LAB — HEPATIC FUNCTION PANEL
ALT: 17 U/L (ref 0–35)
AST: 15 U/L (ref 0–37)
Albumin: 3.6 g/dL (ref 3.5–5.2)
Alkaline Phosphatase: 65 U/L (ref 39–117)
Bilirubin, Direct: 0.1 mg/dL (ref 0.0–0.3)
TOTAL PROTEIN: 6.6 g/dL (ref 6.0–8.3)
Total Bilirubin: 0.3 mg/dL (ref 0.2–1.2)

## 2016-01-25 LAB — LIPID PANEL
CHOLESTEROL: 82 mg/dL (ref 0–200)
HDL: 40.1 mg/dL (ref >39.00)
LDL CALC: 22 mg/dL (ref 0–99)
NonHDL: 41.94
TRIGLYCERIDES: 98 mg/dL (ref 0.0–149.0)
Total CHOL/HDL Ratio: 2
VLDL: 19.6 mg/dL (ref 0.0–40.0)

## 2016-01-25 LAB — BASIC METABOLIC PANEL
BUN: 15 mg/dL (ref 6–23)
CHLORIDE: 105 meq/L (ref 96–112)
CO2: 30 meq/L (ref 19–32)
Calcium: 9.2 mg/dL (ref 8.4–10.5)
Creatinine, Ser: 0.93 mg/dL (ref 0.40–1.20)
GFR: 79.32 mL/min (ref >60.00)
Glucose, Bld: 99 mg/dL (ref 70–99)
Potassium: 4.3 mEq/L (ref 3.5–5.1)
SODIUM: 142 meq/L (ref 135–145)

## 2016-01-25 LAB — TSH: TSH: 1.05 u[IU]/mL (ref 0.35–4.50)

## 2016-01-25 LAB — HEMOGLOBIN A1C: Hgb A1c MFr Bld: 6.5 % (ref 4.6–6.5)

## 2016-01-31 ENCOUNTER — Other Ambulatory Visit: Payer: Self-pay | Admitting: Family Medicine

## 2016-01-31 MED FILL — VALSARTAN-HCTZ 320-12.5 MG: 320-12.5 | 30 days supply | Qty: 30 | Fill #2

## 2016-01-31 MED FILL — ATORVASTATIN 10 MG TABLET: 10 | 30 days supply | Qty: 30 | Fill #2

## 2016-01-31 MED FILL — AMLODIPINE BESYLATE 2.5 MG: 2.5 | 30 days supply | Qty: 30 | Fill #0

## 2016-02-07 ENCOUNTER — Telehealth: Payer: Self-pay | Admitting: Family Medicine

## 2016-02-07 ENCOUNTER — Other Ambulatory Visit: Payer: Self-pay | Admitting: Family Medicine

## 2016-02-07 MED FILL — VENTOLIN HFA 90 MCG INHALER: 108 (90 BAS | 25 days supply | Qty: 18 | Fill #0

## 2016-02-07 NOTE — Telephone Encounter (Signed)
Pt needs refill on her inhaler, [place] out patient pharmacy, pt is aware that [facility] is out of office today and that this may be ready on Weds. Pt states an understanding.

## 2016-02-07 NOTE — Telephone Encounter (Signed)
Med filled already today.

## 2016-02-21 ENCOUNTER — Other Ambulatory Visit (HOSPITAL_COMMUNITY): Payer: 59

## 2016-02-21 ENCOUNTER — Ambulatory Visit: Payer: 59 | Admitting: Vascular Surgery

## 2016-03-06 MED FILL — AMLODIPINE BESYLATE 2.5 MG: 2.5 | 30 days supply | Qty: 30 | Fill #1

## 2016-03-06 MED FILL — ATORVASTATIN 10 MG TABLET: 10 | 30 days supply | Qty: 30 | Fill #3

## 2016-03-06 MED FILL — VALSARTAN-HCTZ 320-12.5 MG: 320-12.5 | 30 days supply | Qty: 30 | Fill #3

## 2016-03-07 ENCOUNTER — Encounter: Payer: Self-pay | Admitting: Vascular Surgery

## 2016-03-13 ENCOUNTER — Ambulatory Visit (HOSPITAL_COMMUNITY)
Admission: RE | Admit: 2016-03-13 | Discharge: 2016-03-13 | Disposition: A | Discharge status: Home / Self Care | Payer: 59 | Source: Ambulatory Visit | Attending: Vascular Surgery | Admitting: Vascular Surgery

## 2016-03-13 ENCOUNTER — Ambulatory Visit (INDEPENDENT_AMBULATORY_CARE_PROVIDER_SITE_OTHER): Payer: 59 | Admitting: Vascular Surgery

## 2016-03-13 ENCOUNTER — Encounter: Payer: Self-pay | Admitting: Vascular Surgery

## 2016-03-13 VITALS — BP 139/93 | HR 91 | Temp 98.5°F | Ht 63.0 in | Wt 282.2 lb

## 2016-03-13 DIAGNOSIS — I714 Abdominal aortic aneurysm, without rupture, unspecified: Secondary | ICD-10-CM

## 2016-03-13 NOTE — Progress Notes (Signed)
Patient name: Ashlee Williams MRN: [id number] DOB: [birth year] Sex: female    HPI: Ashlee Williams is a 60 y.o. female,  Is here for a 1 year follow ultrasound evaluation of her AAA previously measuring 3.3 cm.  She reports no sever lumbar or abdominal pain.  No difficulties with Po intake or bowel or bladder changes.   Other medical problems include DM diet controlled, hyperlipidemia managed with Lipitor, and hypertension managed with Norvasc.     Past Medical History:  Diagnosis Date  . AAA (abdominal aortic aneurysm) (HCC)   . Allergy   . Anemia   . Asthma   . Blood in stool   . Bronchitis   . Chicken pox   . Chronic headaches   . Diabetes mellitus    PT. IS TRYING TO CONTROL WITH DIET BUT AS NOTED WEIGHT IS UP  . Hypertension   . Obesity   . Tobacco abuse    Past Surgical History:  Procedure Laterality Date  . ABDOMINAL HYSTERECTOMY  1998  . CHOLECYSTECTOMY N/A 03/16/2015   Procedure: LAPAROSCOPIC CHOLECYSTECTOMY;  Surgeon: Jimmye NormanJames Wyatt, MD;  [facility];  Service: General;  Laterality: N/A;    Family History  Problem Relation Age of Onset  . Hypertension Mother   . Diabetes Sister   . Hypertension Sister   . Cancer Maternal Aunt   . Hypertension Maternal Aunt   . Stroke Maternal Grandmother   . Hypertension Maternal Grandmother   . Colon cancer Maternal Grandmother [phone]  . Cancer Maternal Grandfather   . Hypertension Maternal Grandfather   . Hypertension Father   . Hypertension Brother   . Hypertension Maternal Uncle   . Hypertension Paternal Aunt   . Hypertension Paternal Uncle   . Hypertension Paternal Grandmother   . Hypertension Paternal Grandfather     SOCIAL HISTORY: Social History   Social History  . Marital status: Single    Spouse name: N/A  . Number of children: N/A  . Years of education: N/A   Occupational History  . Not on file.   Social History Main Topics  . Smoking status: Current Every Day Smoker    Packs/day: 0.50   Years: 10.00    Types: Cigarettes    Last attempt to quit: 07/09/1999  . Smokeless tobacco: Never Used  . Alcohol use 0.0 oz/week  . Drug use: No  . Sexual activity: Yes   Other Topics Concern  . Not on file   Social History Narrative  . No narrative on file    No Known Allergies  Current Outpatient Prescriptions  Medication Sig Dispense Refill  . amLODipine (NORVASC) 2.5 MG tablet TAKE 1 TABLET BY MOUTH ONCE DAILY 30 tablet 6  . APAP-Isometheptene-Dichloral (MIDRIN) 325-65-100 MG CAPS Take 1 capsule by mouth every 6 (six) hours as needed.     Marland Kitchen. atorvastatin (LIPITOR) 10 MG tablet TAKE 1 TABLET BY MOUTH DAILY. 30 tablet 6  . calcium-vitamin D (CALCIUM 500/D) 500-200 MG-UNIT per tablet Take 1 tablet by mouth.    . cetirizine (ZYRTEC) 10 MG tablet Take 10 mg by mouth daily.    . Multiple Vitamins-Minerals (MULTIVITAMIN WITH MINERALS) tablet Take 1 tablet by mouth daily.    . ondansetron (ZOFRAN ODT) 4 MG disintegrating tablet Take 1 tablet (4 mg total) by mouth every 8 (eight) hours as needed for nausea or vomiting. 20 tablet 0  . oxyCODONE-acetaminophen (PERCOCET/ROXICET) 5-325 MG tablet Take 1-2 tablets by mouth every 4 (four) hours as  needed for moderate pain. 40 tablet 0  . valsartan-hydrochlorothiazide (DIOVAN-HCT) 320-12.5 MG tablet TAKE 1 TABLET BY MOUTH DAILY. 30 tablet 6  . VENTOLIN HFA 108 (90 Base) MCG/ACT inhaler INHALE 2 PUFFS INTO THE LUNGS EVERY 4 HOURS AS NEEDED FOR WHEEZING OR SHORTNESS OF BREATH. 18 g 6   No current facility-administered medications for this visit.     ROS:   General:  No weight loss, Fever, chills  HEENT: No recent headaches, no nasal bleeding, no visual changes, no sore throat  Neurologic: No dizziness, blackouts, seizures. No recent symptoms of stroke or mini- stroke. No recent episodes of slurred speech, or temporary blindness.  Cardiac: No recent episodes of chest pain/pressure, no shortness of breath at rest.  No shortness of breath with  exertion.  Denies history of atrial fibrillation or irregular heartbeat  Vascular: No history of rest pain in feet.  No history of claudication.  No history of non-healing ulcer, No history of DVT   Pulmonary: No home oxygen, no productive cough, no hemoptysis,  No asthma or wheezing  Musculoskeletal:  [ ]  Arthritis, [ ]  Low back pain,  [ ]  Joint pain  Hematologic:No history of hypercoagulable state.  No history of easy bleeding.  No history of anemia  Gastrointestinal: No hematochezia or melena,  No gastroesophageal reflux, no trouble swallowing  Urinary: [ ]  chronic Kidney disease, [ ]  on HD - [ ]  MWF or [ ]  TTHS, [ ]  Burning with urination, [ ]  Frequent urination, [ ]  Difficulty urinating;   Skin: No rashes  Psychological: No history of anxiety,  No history of depression   Physical Examination  Vitals:   03/13/16 1549  BP: (!) 139/93  Pulse: 91  Temp: 98.5 F (36.9 C)  TempSrc: Oral  SpO2: 100%  Weight: 282 lb 3.2 oz (128 kg)  Height: 5\' 3"  (1.6 m)    Body mass index is 49.99 kg/m.  General:  Alert and oriented, no acute distress HEENT: Normal Neck: No bruit or JVD Pulmonary: Clear to auscultation bilaterally Cardiac: Regular Rate and Rhythm without murmur Abdomen: Soft, non-tender, non-distended, no mass, no scars Skin: No rash Extremity Pulses:  2+ radial, brachial, femoral, dorsalis pedis, posterior tibial pulses bilaterally Musculoskeletal: No deformity or edema  Neurologic: Upper and lower extremity motor 5/5 and symmetric  DATA:  AAA ultrasound  3.3 cm no change from previous exam.   ASSESSMENT:   Mild AAA measuring 3.3 cm normal aorta measures about 2.0 cm.   PLAN:   No change in her AAA per ultrasound and no symptoms.  She currently has zero risk based on her AAA ultrasound of rupture.  No restriction on her activity.  Recommend continued HTN control and DM control.  We will have her follow up in 2 years for a repeat ultrasound.     Thomasena EdisOLLINS, EMMA  MAUREEN PA-C [facility]  The patient was seen in conjunction with Dr. Arbie CookeyEarly today  I have examined the patient, reviewed and agree with above.  Gretta BeganEarly, Chantrell Apsey, MD 03/13/2016 4:43 PM

## 2016-03-15 NOTE — Addendum Note (Signed)
Addended by: Burton ApleyPETTY, Kaien Pezzullo A on: 03/15/2016 09:49 AM   Modules accepted: Orders

## 2016-04-10 MED FILL — VENTOLIN HFA 90 MCG INHALER: 108 (90 BAS | 25 days supply | Qty: 18 | Fill #1

## 2016-04-10 MED FILL — ATORVASTATIN 10 MG TABLET: 10 | 30 days supply | Qty: 30 | Fill #4

## 2016-04-10 MED FILL — VALSARTAN-HCTZ 320-12.5 MG: 320-12.5 | 30 days supply | Qty: 30 | Fill #4

## 2016-04-10 MED FILL — AMLODIPINE BESYLATE 2.5 MG: 2.5 | 30 days supply | Qty: 30 | Fill #2

## 2016-04-18 LAB — HM DIABETES EYE EXAM

## 2016-05-08 ENCOUNTER — Other Ambulatory Visit: Payer: Self-pay | Admitting: Family Medicine

## 2016-05-08 DIAGNOSIS — Z1231 Encounter for screening mammogram for malignant neoplasm of breast: Secondary | ICD-10-CM

## 2016-05-15 MED FILL — ATORVASTATIN 10 MG TABLET: 10 | 30 days supply | Qty: 30 | Fill #5

## 2016-05-15 MED FILL — AMLODIPINE BESYLATE 2.5 MG: 2.5 | 30 days supply | Qty: 30 | Fill #3

## 2016-05-15 MED FILL — VALSARTAN-HCTZ 320-12.5 MG: 320-12.5 | 30 days supply | Qty: 30 | Fill #5

## 2016-06-07 ENCOUNTER — Encounter: Payer: Self-pay | Admitting: Family Medicine

## 2016-06-07 ENCOUNTER — Ambulatory Visit (INDEPENDENT_AMBULATORY_CARE_PROVIDER_SITE_OTHER): Payer: 59 | Admitting: Family Medicine

## 2016-06-07 VITALS — BP 129/83 | HR 88 | Temp 98.0°F | Resp 16 | Ht 63.0 in | Wt 284.2 lb

## 2016-06-07 DIAGNOSIS — Z0000 Encounter for general adult medical examination without abnormal findings: Secondary | ICD-10-CM | POA: Diagnosis not present

## 2016-06-07 DIAGNOSIS — E119 Type 2 diabetes mellitus without complications: Secondary | ICD-10-CM

## 2016-06-07 DIAGNOSIS — Z1159 Encounter for screening for other viral diseases: Secondary | ICD-10-CM

## 2016-06-07 LAB — VITAMIN D 25 HYDROXY (VIT D DEFICIENCY, FRACTURES): VITD: 24.46 ng/mL — ABNORMAL LOW (ref 30.00–100.00)

## 2016-06-07 LAB — CBC WITH DIFFERENTIAL/PLATELET
BASOS ABS: 0.1 10*3/uL (ref 0.0–0.1)
Basophils Relative: 0.8 % (ref 0.0–3.0)
Eosinophils Absolute: 0.1 10*3/uL (ref 0.0–0.7)
Eosinophils Relative: 1.7 % (ref 0.0–5.0)
HEMATOCRIT: 40.2 % (ref 36.0–46.0)
Hemoglobin: 12.8 g/dL (ref 12.0–15.0)
LYMPHS PCT: 35 % (ref 12.0–46.0)
Lymphs Abs: 2.4 10*3/uL (ref 0.7–4.0)
MCHC: 31.9 g/dL (ref 30.0–36.0)
MCV: 82.2 fl (ref 78.0–100.0)
MONOS PCT: 7.6 % (ref 3.0–12.0)
Monocytes Absolute: 0.5 10*3/uL (ref 0.1–1.0)
NEUTROS ABS: 3.8 10*3/uL (ref 1.4–7.7)
Neutrophils Relative %: 54.9 % (ref 43.0–77.0)
PLATELETS: 229 10*3/uL (ref 150.0–400.0)
RBC: 4.89 Mil/uL (ref 3.87–5.11)
RDW: 15.4 % (ref 11.5–15.5)
WBC: 7 10*3/uL (ref 4.0–10.5)

## 2016-06-07 LAB — LIPID PANEL
CHOL/HDL RATIO: 2
Cholesterol: 89 mg/dL (ref 0–200)
HDL: 43.5 mg/dL (ref >39.00)
LDL CALC: 31 mg/dL (ref 0–99)
NONHDL: 45.4
TRIGLYCERIDES: 73 mg/dL (ref 0.0–149.0)
VLDL: 14.6 mg/dL (ref 0.0–40.0)

## 2016-06-07 LAB — BASIC METABOLIC PANEL
BUN: 15 mg/dL (ref 6–23)
CO2: 30 mEq/L (ref 19–32)
Calcium: 9.3 mg/dL (ref 8.4–10.5)
Chloride: 105 mEq/L (ref 96–112)
Creatinine, Ser: 0.87 mg/dL (ref 0.40–1.20)
GFR: 85.56 mL/min (ref >60.00)
Glucose, Bld: 93 mg/dL (ref 70–99)
POTASSIUM: 4.1 meq/L (ref 3.5–5.1)
SODIUM: 141 meq/L (ref 135–145)

## 2016-06-07 LAB — HEPATIC FUNCTION PANEL
ALBUMIN: 3.8 g/dL (ref 3.5–5.2)
ALK PHOS: 66 U/L (ref 39–117)
ALT: 15 U/L (ref 0–35)
AST: 13 U/L (ref 0–37)
Bilirubin, Direct: 0.1 mg/dL (ref 0.0–0.3)
Total Bilirubin: 0.3 mg/dL (ref 0.2–1.2)
Total Protein: 7.1 g/dL (ref 6.0–8.3)

## 2016-06-07 LAB — HEMOGLOBIN A1C: Hgb A1c MFr Bld: 6.7 % — ABNORMAL HIGH (ref 4.6–6.5)

## 2016-06-07 LAB — TSH: TSH: 0.95 u[IU]/mL (ref 0.35–4.50)

## 2016-06-07 NOTE — Progress Notes (Signed)
Pre visit review using our clinic review tool, if applicable. No additional management support is needed unless otherwise documented below in the visit note. 

## 2016-06-07 NOTE — Assessment & Plan Note (Signed)
Pt's PE WNL w/ exception of morbid obesity.  UTD on colonoscopy, mammo scheduled for Monday.  No need for paps.  UTD on immunizations.  Stressed need for healthy diet and regular exercise.  Check labs.  Anticipatory guidance provided.

## 2016-06-07 NOTE — Assessment & Plan Note (Signed)
Chronic problem.  UTD on foot exam, eye exam, on ARB for renal protection.  Check labs.  Start meds prn.

## 2016-06-07 NOTE — Progress Notes (Signed)
   Subjective:    Patient ID: Ashlee Williams, female    DOB: [birth year], 61 y.o.   MRN: [id number]  HPI CPE- UTD on colonoscopy, mammo scheduled for Monday.  No need for paps due to hysterectomy.  Pt due for hep C screening.   Review of Systems Patient reports no vision/ hearing changes, adenopathy,fever, weight change,  persistant/recurrent hoarseness , swallowing issues, chest pain, palpitations, edema, persistant/recurrent cough, hemoptysis, dyspnea (rest/exertional/paroxysmal nocturnal), gastrointestinal bleeding (melena, rectal bleeding), abdominal pain, significant heartburn, bowel changes, GU symptoms (dysuria, hematuria, incontinence), Gyn symptoms (abnormal  bleeding, pain),  syncope, focal weakness, memory loss, numbness & tingling, skin/hair/nail changes, abnormal bruising or bleeding, anxiety, or depression.   + daytime sleepiness- denies snoring    Objective:   Physical Exam General Appearance:    Alert, cooperative, no distress, appears stated age, morbidly obese  Head:    Normocephalic, without obvious abnormality, atraumatic  Eyes:    PERRL, conjunctiva/corneas clear, EOM's intact, fundi    benign, both eyes  Ears:    Normal TM's and external ear canals, both ears  Nose:   Nares normal, septum midline, mucosa normal, no drainage    or sinus tenderness  Throat:   Lips, mucosa, and tongue normal; teeth and gums normal  Neck:   Supple, symmetrical, trachea midline, no adenopathy;    Thyroid: no enlargement/tenderness/nodules  Back:     Symmetric, no curvature, ROM normal, no CVA tenderness  Lungs:     Clear to auscultation bilaterally, respirations unlabored  Chest Wall:    No tenderness or deformity   Heart:    Regular rate and rhythm, S1 and S2 normal, no murmur, rub   or gallop  Breast Exam:    Deferred to mammo  Abdomen:     Soft, non-tender, bowel sounds active all four quadrants,    no masses, no organomegaly  Genitalia:    Deferred  Rectal:    Extremities:    Extremities normal, atraumatic, no cyanosis or edema  Pulses:   2+ and symmetric all extremities  Skin:   Skin color, texture, turgor normal, no rashes or lesions  Lymph nodes:   Cervical, supraclavicular, and axillary nodes normal  Neurologic:   CNII-XII intact, normal strength, sensation and reflexes    throughout          Assessment & Plan:

## 2016-06-07 NOTE — Patient Instructions (Signed)
Follow up in 3-4 months to recheck diabetes We'll notify you of your lab results and make any changes if needed Continue to work on healthy diet and regular exercise- you can do it! We'll call you with your pulmonary appt to assess for sleep apnea You are up to date on colonoscopy- yay! Please have them send me the results of your mammogram Call with any questions or concerns [place]!!!

## 2016-06-07 NOTE — Assessment & Plan Note (Signed)
Ongoing issue for pt.  Stressed need for healthy diet and regular exercise.  Due to excessive daytime sleepiness will refer to pulm for evaluation of possible OSA.  Pt expressed understanding and is in agreement w/ plan.

## 2016-06-08 ENCOUNTER — Other Ambulatory Visit: Payer: Self-pay | Admitting: General Practice

## 2016-06-08 LAB — HEPATITIS C ANTIBODY: HCV Ab: NEGATIVE

## 2016-06-08 MED ORDER — VITAMIN D (ERGOCALCIFEROL) 1.25 MG (50000 UNIT) PO CAPS: 50000.0000 [IU] | ORAL_CAPSULE | ORAL | 0 refills | Status: DC

## 2016-06-08 MED FILL — VIT D2 1.25 MG (50,000 UNIT: 1.25 MG | 84 days supply | Qty: 12 | Fill #0

## 2016-06-11 ENCOUNTER — Ambulatory Visit (INDEPENDENT_AMBULATORY_CARE_PROVIDER_SITE_OTHER): Payer: 59 | Admitting: Internal Medicine

## 2016-06-11 ENCOUNTER — Ambulatory Visit
Admission: RE | Admit: 2016-06-11 | Discharge: 2016-06-11 | Disposition: A | Discharge status: Home / Self Care | Payer: 59 | Source: Ambulatory Visit | Attending: Family Medicine | Admitting: Family Medicine

## 2016-06-11 ENCOUNTER — Encounter: Payer: Self-pay | Admitting: Internal Medicine

## 2016-06-11 VITALS — BP 132/78 | HR 79 | Ht 63.0 in | Wt 281.0 lb

## 2016-06-11 DIAGNOSIS — J301 Allergic rhinitis due to pollen: Secondary | ICD-10-CM

## 2016-06-11 DIAGNOSIS — F17200 Nicotine dependence, unspecified, uncomplicated: Secondary | ICD-10-CM

## 2016-06-11 DIAGNOSIS — J310 Chronic rhinitis: Secondary | ICD-10-CM | POA: Insufficient documentation

## 2016-06-11 DIAGNOSIS — G4733 Obstructive sleep apnea (adult) (pediatric): Secondary | ICD-10-CM | POA: Insufficient documentation

## 2016-06-11 DIAGNOSIS — Z1231 Encounter for screening mammogram for malignant neoplasm of breast: Secondary | ICD-10-CM

## 2016-06-11 NOTE — Assessment & Plan Note (Signed)
Smoking cessation would help rhinitis and overall health as emphasized.

## 2016-06-11 NOTE — Patient Instructions (Addendum)
Order- schedule unattended Home Sleep Test    Dx OSA   Please call as needed

## 2016-06-11 NOTE — Assessment & Plan Note (Signed)
Tentative diagnosis with high probability based on history and exam. Plan-appropriate initial education done. Sleep hygiene discussed. Basics of OSA, responsibility to drive safely. Schedule sleep study.

## 2016-06-11 NOTE — Assessment & Plan Note (Signed)
Suspect mixture of seasonal allergy and tobacco irritant rhinitis. Plan-suggested Flonase

## 2016-06-11 NOTE — Progress Notes (Signed)
06/11/2016- 61 year old female smoker referred courtesy of Dr Beverely Low; never had sleep study-pt can take HST machine home today. Pt states she is sleepy during the day, fatigued,headaches. Pt sleeps about 7-8 hours-feels like she sleeps well-wakes about 2-3 times during the night for restroom breaks; falls right back to sleep. Alone. Aware that she snores loudly. Not aware of sleep disturbance from limb movements or parasomnias. No sleep medicines. One cup of coffee. No ENT surgery. History asthmatic bronchitis with only occasional use of albuterol inhaler. No routine wheeze or cough. Some dyspnea with exertion. Denies history of thyroid or heart problems. Managed hypertension. Chronic headaches.  Prior to Admission medications   Medication Sig Start Date End Date Taking? Authorizing Provider  amLODipine (NORVASC) 2.5 MG tablet TAKE 1 TABLET BY MOUTH ONCE DAILY 01/31/16  Yes Sheliah Hatch, MD  APAP-Isometheptene-Dichloral (MIDRIN) 325-65-100 MG CAPS Take 1 capsule by mouth every 6 (six) hours as needed.    Yes Historical Provider, MD  atorvastatin (LIPITOR) 10 MG tablet TAKE 1 TABLET BY MOUTH DAILY. 12/01/15  Yes Sheliah Hatch, MD  calcium-vitamin D (CALCIUM 500/D) 500-200 MG-UNIT per tablet Take 1 tablet by mouth.   Yes Historical Provider, MD  cetirizine (ZYRTEC) 10 MG tablet Take 10 mg by mouth daily.   Yes Historical Provider, MD  Multiple Vitamins-Minerals (MULTIVITAMIN WITH MINERALS) tablet Take 1 tablet by mouth daily.   Yes Historical Provider, MD  ondansetron (ZOFRAN ODT) 4 MG disintegrating tablet Take 1 tablet (4 mg total) by mouth every 8 (eight) hours as needed for nausea or vomiting. 11/16/14  Yes Teressa Lower, NP  valsartan-hydrochlorothiazide (DIOVAN-HCT) 320-12.5 MG tablet TAKE 1 TABLET BY MOUTH DAILY. 12/01/15  Yes Sheliah Hatch, MD  VENTOLIN HFA 108 (90 Base) MCG/ACT inhaler INHALE 2 PUFFS INTO THE LUNGS EVERY 4 HOURS AS NEEDED FOR WHEEZING OR SHORTNESS OF BREATH.  02/07/16  Yes Sheliah Hatch, MD  Vitamin D, Ergocalciferol, (DRISDOL) 50000 units CAPS capsule Take 1 capsule (50,000 Units total) by mouth every 7 (seven) days. 06/08/16  Yes Sheliah Hatch, MD   Past Medical History:  Diagnosis Date  . AAA (abdominal aortic aneurysm) (HCC)   . Allergy   . Anemia   . Asthma   . Blood in stool   . Bronchitis   . Chicken pox   . Chronic headaches   . Diabetes mellitus    PT. IS TRYING TO CONTROL WITH DIET BUT AS NOTED WEIGHT IS UP  . Hypertension   . Obesity   . Tobacco abuse    Past Surgical History:  Procedure Laterality Date  . ABDOMINAL HYSTERECTOMY  1998  . CHOLECYSTECTOMY N/A 03/16/2015   Procedure: LAPAROSCOPIC CHOLECYSTECTOMY;  Surgeon: Jimmye Norman, MD;  Location: [facility] OR;  Service: General;  Laterality: N/A;   Family History  Problem Relation Age of Onset  . Hypertension Mother   . Stroke Mother   . Hypertension Father   . Diabetes Sister   . Hypertension Sister   . Cancer Maternal Aunt   . Hypertension Maternal Aunt   . Stroke Maternal Grandmother   . Hypertension Maternal Grandmother   . Colon cancer Maternal Grandmother 20  . Cancer Maternal Grandfather   . Hypertension Maternal Grandfather   . Hypertension Brother   . Hypertension Maternal Uncle   . Hypertension Paternal Aunt   . Hypertension Paternal Uncle   . Hypertension Paternal Grandmother   . Hypertension Paternal Grandfather    Social History   Social History  . Marital  status: Single    Spouse name: N/A  . Number of children: N/A  . Years of education: N/A   Occupational History  . Not on file.   Social History Main Topics  . Smoking status: Current Every Day Smoker    Packs/day: 0.50    Years: 10.00    Types: Cigarettes    Last attempt to quit: 07/09/1999  . Smokeless tobacco: Never Used  . Alcohol use 0.0 oz/week  . Drug use: No  . Sexual activity: Yes   Other Topics Concern  . Not on file   Social History Narrative  . No narrative on  file   ROS-see HPI   Negative unless "+" Constitutional:    weight loss, night sweats, fevers, chills, + fatigue, lassitude. HEENT:    + headaches, difficulty swallowing, tooth/dental problems, sore throat,       sneezing, itching, ear ache, nasal congestion, post nasal drip, snoring CV:    chest pain, orthopnea, PND, swelling in lower extremities, anasarca,                                                        dizziness, palpitations Resp:   + shortness of breath with exertion or at rest.                productive cough,   non-productive cough, coughing up of blood.              change in color of mucus.  wheezing.   Skin:    rash or lesions. GI:  No-   heartburn, indigestion, abdominal pain, nausea, vomiting, diarrhea,                 change in bowel habits, loss of appetite GU: dysuria, change in color of urine, no urgency or frequency.   flank pain. MS:   joint pain, stiffness, decreased range of motion, back pain. Neuro-     nothing unusual Psych:  change in mood or affect.  depression or anxiety.   memory loss.  OBJ- Physical Exam General- Alert, Oriented, Affect-appropriate, Distress- none acute, + morbidly obese Skin- rash-none, lesions- none, excoriation- none Lymphadenopathy- none Head- atraumatic            Eyes- Gross vision intact, PERRLA, conjunctivae and secretions clear            Ears- Hearing, canals-normal            Nose- + turbinate edema, no-Septal dev, mucus, polyps, erosion, perforation             Throat- Mallampati III-IV , mucosa clear , drainage- none, tonsils- atrophic Neck- flexible , trachea midline, no stridor , thyroid nl, carotid no bruit Chest - symmetrical excursion , unlabored           Heart/CV- RRR , no murmur , no gallop  , no rub, nl s1 s2                           - JVD- none , edema- none, stasis changes- none, varices- none           Lung- clear to P&A, wheeze- none, cough- none , dullness-none, rub- none           Chest wall-  Abd-  Br/  Gen/ Rectal- Not done, not indicated Extrem- cyanosis- none, clubbing, none, atrophy- none, strength- nl Neuro- grossly intact to observation

## 2016-06-15 ENCOUNTER — Other Ambulatory Visit: Payer: Self-pay | Admitting: *Deleted

## 2016-06-15 DIAGNOSIS — G4733 Obstructive sleep apnea (adult) (pediatric): Secondary | ICD-10-CM

## 2016-06-15 MED FILL — AMLODIPINE BESYLATE 2.5 MG: 2.5 | 30 days supply | Qty: 30 | Fill #4

## 2016-06-15 MED FILL — VALSARTAN-HCTZ 320-12.5 MG: 320-12.5 | 30 days supply | Qty: 30 | Fill #6

## 2016-06-15 MED FILL — ATORVASTATIN 10 MG TABLET: 10 | 30 days supply | Qty: 30 | Fill #6

## 2016-06-22 ENCOUNTER — Encounter: Payer: Self-pay | Admitting: Family Medicine

## 2016-06-23 DIAGNOSIS — G4733 Obstructive sleep apnea (adult) (pediatric): Secondary | ICD-10-CM | POA: Diagnosis not present

## 2016-06-25 ENCOUNTER — Ambulatory Visit (INDEPENDENT_AMBULATORY_CARE_PROVIDER_SITE_OTHER): Payer: 59 | Admitting: Physician Assistant

## 2016-06-25 ENCOUNTER — Encounter: Payer: Self-pay | Admitting: Physician Assistant

## 2016-06-25 VITALS — BP 138/80 | HR 94 | Temp 97.6°F | Resp 16 | Ht 63.0 in | Wt 280.0 lb

## 2016-06-25 DIAGNOSIS — J019 Acute sinusitis, unspecified: Secondary | ICD-10-CM | POA: Diagnosis not present

## 2016-06-25 DIAGNOSIS — B9689 Other specified bacterial agents as the cause of diseases classified elsewhere: Secondary | ICD-10-CM

## 2016-06-25 MED ORDER — AMOXICILLIN-POT CLAVULANATE 875-125 MG PO TABS: 1.0000 | ORAL_TABLET | Freq: Two times a day (BID) | ORAL | 0 refills | Status: DC

## 2016-06-25 MED ORDER — BENZONATATE 100 MG PO CAPS: 100.0000 mg | ORAL_CAPSULE | Freq: Two times a day (BID) | ORAL | 0 refills | Status: DC | PRN

## 2016-06-25 MED FILL — AMOX TR-K CLV 875-125 MG TA: 875-125 | 7 days supply | Qty: 14 | Fill #0

## 2016-06-25 MED FILL — BENZONATATE 100 MG CAP: 100 | 10 days supply | Qty: 20 | Fill #0

## 2016-06-25 NOTE — Progress Notes (Signed)
Pre visit review using our clinic review tool, if applicable. No additional management support is needed unless otherwise documented below in the visit note. 

## 2016-06-25 NOTE — Patient Instructions (Signed)
Please take antibiotic as directed.  Increase fluid intake.  Use Saline nasal spray.  Take a daily multivitamin. Use the Tessalon as directed.  Place a humidifier in the bedroom.  Please call or return clinic if symptoms are not improving.  Sinusitis Sinusitis is redness, soreness, and swelling (inflammation) of the paranasal sinuses. Paranasal sinuses are air pockets within the bones of your face (beneath the eyes, the middle of the forehead, or above the eyes). In healthy paranasal sinuses, mucus is able to drain out, and air is able to circulate through them by way of your nose. However, when your paranasal sinuses are inflamed, mucus and air can become trapped. This can allow bacteria and other germs to grow and cause infection. Sinusitis can develop quickly and last only a short time (acute) or continue over a long period (chronic). Sinusitis that lasts for more than 12 weeks is considered chronic.  CAUSES  Causes of sinusitis include:  Allergies.  Structural abnormalities, such as displacement of the cartilage that separates your nostrils (deviated septum), which can decrease the air flow through your nose and sinuses and affect sinus drainage.  Functional abnormalities, such as when the small hairs (cilia) that line your sinuses and help remove mucus do not work properly or are not present. SYMPTOMS  Symptoms of acute and chronic sinusitis are the same. The primary symptoms are pain and pressure around the affected sinuses. Other symptoms include:  Upper toothache.  Earache.  Headache.  Bad breath.  Decreased sense of smell and taste.  A cough, which worsens when you are lying flat.  Fatigue.  Fever.  Thick drainage from your nose, which often is green and may contain pus (purulent).  Swelling and warmth over the affected sinuses. DIAGNOSIS  Your caregiver will perform a physical exam. During the exam, your caregiver may:  Look in your nose for signs of abnormal growths  in your nostrils (nasal polyps).  Tap over the affected sinus to check for signs of infection.  View the inside of your sinuses (endoscopy) with a special imaging device with a light attached (endoscope), which is inserted into your sinuses. If your caregiver suspects that you have chronic sinusitis, one or more of the following tests may be recommended:  Allergy tests.  Nasal culture A sample of mucus is taken from your nose and sent to a lab and screened for bacteria.  Nasal cytology A sample of mucus is taken from your nose and examined by your caregiver to determine if your sinusitis is related to an allergy. TREATMENT  Most cases of acute sinusitis are related to a viral infection and will resolve on their own within 10 days. Sometimes medicines are prescribed to help relieve symptoms (pain medicine, decongestants, nasal steroid sprays, or saline sprays).  However, for sinusitis related to a bacterial infection, your caregiver will prescribe antibiotic medicines. These are medicines that will help kill the bacteria causing the infection.  Rarely, sinusitis is caused by a fungal infection. In theses cases, your caregiver will prescribe antifungal medicine. For some cases of chronic sinusitis, surgery is needed. Generally, these are cases in which sinusitis recurs more than 3 times per year, despite other treatments. HOME CARE INSTRUCTIONS   Drink plenty of water. Water helps thin the mucus so your sinuses can drain more easily.  Use a humidifier.  Inhale steam 3 to 4 times a day (for example, sit in the bathroom with the shower running).  Apply a warm, moist washcloth to your face 3   to 4 times a day, or as directed by your caregiver.  Use saline nasal sprays to help moisten and clean your sinuses.  Take over-the-counter or prescription medicines for pain, discomfort, or fever only as directed by your caregiver. SEEK IMMEDIATE MEDICAL CARE IF:  You have increasing pain or severe  headaches.  You have nausea, vomiting, or drowsiness.  You have swelling around your face.  You have vision problems.  You have a stiff neck.  You have difficulty breathing. MAKE SURE YOU:   Understand these instructions.  Will watch your condition.  Will get help right away if you are not doing well or get worse. Document Released: 03/26/2005 Document Revised: 06/18/2011 Document Reviewed: 04/10/2011 [employer] Patient Information 2014 [employer].   

## 2016-06-25 NOTE — Progress Notes (Signed)
Patient presents to clinic today c/o cough, nasal and chest congestion and headache x 2 week. Cough is productive with yellow-green sputum. Sinus headache is located frontally and is constant, and described as aching. Additionally admits chills, diaphoresis. States does not have thermometer at home, but afebrile today. States cough is worse at night and is affecting sleep. Tried cough drops with minimal relief, Ibuprofen with relief for headache. Denies sore throat, ear pain, pressure, tinnitus, palpitations, vision changes, diarrhea, constipation, hematochezia, urinary changes. Denies sick contacts and recent travel.    Past Medical History:  Diagnosis Date  . AAA (abdominal aortic aneurysm) (HCC)   . Allergy   . Anemia   . Asthma   . Blood in stool   . Bronchitis   . Chicken pox   . Chronic headaches   . Diabetes mellitus    PT. IS TRYING TO CONTROL WITH DIET BUT AS NOTED WEIGHT IS UP  . Hypertension   . Obesity   . Tobacco abuse     Current Outpatient Prescriptions on File Prior to Visit  Medication Sig Dispense Refill  . amLODipine (NORVASC) 2.5 MG tablet TAKE 1 TABLET BY MOUTH ONCE DAILY 30 tablet 6  . APAP-Isometheptene-Dichloral (MIDRIN) 325-65-100 MG CAPS Take 1 capsule by mouth every 6 (six) hours as needed.     Marland Kitchen atorvastatin (LIPITOR) 10 MG tablet TAKE 1 TABLET BY MOUTH DAILY. 30 tablet 6  . calcium-vitamin D (CALCIUM 500/D) 500-200 MG-UNIT per tablet Take 1 tablet by mouth.    . cetirizine (ZYRTEC) 10 MG tablet Take 10 mg by mouth daily.    . Multiple Vitamins-Minerals (MULTIVITAMIN WITH MINERALS) tablet Take 1 tablet by mouth daily.    . ondansetron (ZOFRAN ODT) 4 MG disintegrating tablet Take 1 tablet (4 mg total) by mouth every 8 (eight) hours as needed for nausea or vomiting. 20 tablet 0  . valsartan-hydrochlorothiazide (DIOVAN-HCT) 320-12.5 MG tablet TAKE 1 TABLET BY MOUTH DAILY. 30 tablet 6  . VENTOLIN HFA 108 (90 Base) MCG/ACT inhaler INHALE 2 PUFFS INTO THE LUNGS  EVERY 4 HOURS AS NEEDED FOR WHEEZING OR SHORTNESS OF BREATH. 18 g 6  . Vitamin D, Ergocalciferol, (DRISDOL) 50000 units CAPS capsule Take 1 capsule (50,000 Units total) by mouth every 7 (seven) days. 12 capsule 0   No current facility-administered medications on file prior to visit.     No Known Allergies  Family History  Problem Relation Age of Onset  . Hypertension Mother   . Stroke Mother   . Hypertension Father   . Diabetes Sister   . Hypertension Sister   . Cancer Maternal Aunt   . Hypertension Maternal Aunt   . Stroke Maternal Grandmother   . Hypertension Maternal Grandmother   . Colon cancer Maternal Grandmother 20  . Cancer Maternal Grandfather   . Hypertension Maternal Grandfather   . Hypertension Brother   . Hypertension Maternal Uncle   . Hypertension Paternal Aunt   . Hypertension Paternal Uncle   . Hypertension Paternal Grandmother   . Hypertension Paternal Grandfather     Social History   Social History  . Marital status: Single    Spouse name: N/A  . Number of children: N/A  . Years of education: N/A   Social History Main Topics  . Smoking status: Current Every Day Smoker    Packs/day: 0.50    Years: 10.00    Types: Cigarettes    Last attempt to quit: 07/09/1999  . Smokeless tobacco: Never Used  . Alcohol  use 0.0 oz/week  . Drug use: No  . Sexual activity: Yes   Other Topics Concern  . None   Social History Narrative  . None    Review of Systems - See HPI.  All other ROS are negative.  BP 138/80   Pulse 94   Temp 97.6 F (36.4 C) (Oral)   Resp 16   Ht 5\' 3"  (1.6 m)   Wt 280 lb (127 kg)   LMP 11/10/2010   SpO2 98%   BMI 49.60 kg/m   Physical Exam  Constitutional: She is oriented to person, place, and time and well-developed, well-nourished, and in no distress. No distress.  HENT:  Right Ear: External ear normal. Tympanic membrane is not erythematous. A middle ear effusion (serous) is present.  Left Ear: External ear normal.  Tympanic membrane is not erythematous. A middle ear effusion (scant, serous) is present.  Nose: Mucosal edema (with erythema) present. Right sinus exhibits maxillary sinus tenderness and frontal sinus tenderness. Left sinus exhibits maxillary sinus tenderness and frontal sinus tenderness.  Mouth/Throat: Oropharynx is clear and moist. No oropharyngeal exudate.  Eyes: Conjunctivae are normal. Pupils are equal, round, and reactive to light. Right eye exhibits no discharge. Left eye exhibits no discharge. No scleral icterus.  Neck: Neck supple.  Cardiovascular: Normal rate, regular rhythm and normal heart sounds.  Exam reveals no gallop and no friction rub.   No murmur heard. Pulmonary/Chest: Effort normal and breath sounds normal. No respiratory distress. She has no wheezes. She has no rales.  Lymphadenopathy:       Head (right side): Submandibular adenopathy present.  Neurological: She is alert and oriented to person, place, and time.  Skin: Skin is warm and dry. She is not diaphoretic.  Psychiatric: Affect normal.  Vitals reviewed.   Recent Results (from the past 2160 hour(s))  Hemoglobin A1c     Status: Abnormal   Collection Time: 06/07/16  9:39 AM  Result Value Ref Range   Hgb A1c MFr Bld 6.7 (H) 4.6 - 6.5 %    Comment: Glycemic Control Guidelines for People with Diabetes:Non Diabetic:  <6%Goal of Therapy: <7%Additional Action Suggested:  >8%   Lipid panel     Status: None   Collection Time: 06/07/16  9:39 AM  Result Value Ref Range   Cholesterol 89 0 - 200 mg/dL    Comment: ATP III Classification       Desirable:  < 200 mg/dL               Borderline High:  200 - 239 mg/dL          High:  > = 409 mg/dL   Triglycerides 81.1 0.0 - 149.0 mg/dL    Comment: Normal:  <914 mg/dLBorderline High:  150 - 199 mg/dL   HDL 78.29 >56.21 mg/dL   VLDL 30.8 0.0 - 65.7 mg/dL   LDL Cholesterol 31 0 - 99 mg/dL   Total CHOL/HDL Ratio 2     Comment:                Men          Women1/2 Average Risk      3.4          3.3Average Risk          5.0          4.42X Average Risk          9.6          7.13X Average Risk  15.0          11.0                       NonHDL 45.40     Comment: NOTE:  Non-HDL goal should be 30 mg/dL higher than patient's LDL goal (i.e. LDL goal of < 70 mg/dL, would have non-HDL goal of < 100 mg/dL)  Basic metabolic panel     Status: None   Collection Time: 06/07/16  9:39 AM  Result Value Ref Range   Sodium 141 135 - 145 mEq/L   Potassium 4.1 3.5 - 5.1 mEq/L   Chloride 105 96 - 112 mEq/L   CO2 30 19 - 32 mEq/L   Glucose, Bld 93 70 - 99 mg/dL   BUN 15 6 - 23 mg/dL   Creatinine, Ser 1.61 0.40 - 1.20 mg/dL   Calcium 9.3 8.4 - 09.6 mg/dL   GFR 04.54 >09.81 mL/min  TSH     Status: None   Collection Time: 06/07/16  9:39 AM  Result Value Ref Range   TSH 0.95 0.35 - 4.50 uIU/mL  Hepatic function panel     Status: None   Collection Time: 06/07/16  9:39 AM  Result Value Ref Range   Total Bilirubin 0.3 0.2 - 1.2 mg/dL   Bilirubin, Direct 0.1 0.0 - 0.3 mg/dL   Alkaline Phosphatase 66 39 - 117 U/L   AST 13 0 - 37 U/L   ALT 15 0 - 35 U/L   Total Protein 7.1 6.0 - 8.3 g/dL   Albumin 3.8 3.5 - 5.2 g/dL  VITAMIN D 25 Hydroxy (Vit-D Deficiency, Fractures)     Status: Abnormal   Collection Time: 06/07/16  9:39 AM  Result Value Ref Range   VITD 24.46 (L) 30.00 - 100.00 ng/mL  CBC with Differential/Platelet     Status: None   Collection Time: 06/07/16  9:39 AM  Result Value Ref Range   WBC 7.0 4.0 - 10.5 K/uL   RBC 4.89 3.87 - 5.11 Mil/uL   Hemoglobin 12.8 12.0 - 15.0 g/dL   HCT 19.1 47.8 - 29.5 %   MCV 82.2 78.0 - 100.0 fl   MCHC 31.9 30.0 - 36.0 g/dL   RDW 62.1 30.8 - 65.7 %   Platelets 229.0 150.0 - 400.0 K/uL   Neutrophils Relative % 54.9 43.0 - 77.0 %   Lymphocytes Relative 35.0 12.0 - 46.0 %   Monocytes Relative 7.6 3.0 - 12.0 %   Eosinophils Relative 1.7 0.0 - 5.0 %   Basophils Relative 0.8 0.0 - 3.0 %   Neutro Abs 3.8 1.4 - 7.7 K/uL   Lymphs Abs 2.4 0.7  - 4.0 K/uL   Monocytes Absolute 0.5 0.1 - 1.0 K/uL   Eosinophils Absolute 0.1 0.0 - 0.7 K/uL   Basophils Absolute 0.1 0.0 - 0.1 K/uL  Hepatitis C Antibody     Status: None   Collection Time: 06/07/16  9:39 AM  Result Value Ref Range   HCV Ab NEGATIVE NEGATIVE    Assessment/Plan: 1. Acute bacterial sinusitis Rx Augmentin.  Increase fluids.  Rest.  Saline nasal spray.  Probiotic.  Mucinex as directed.  Humidifier in bedroom. Tessalon as directed for cough.  Call or return to clinic if symptoms are not improving. - amoxicillin-clavulanate (AUGMENTIN) 875-125 MG tablet; Take 1 tablet by mouth 2 (two) times daily.  Dispense: 14 tablet; Refill: 0 - benzonatate (TESSALON) 100 MG capsule; Take 1 capsule (100 mg total) by mouth 2 (  two) times daily as needed for cough.  Dispense: 20 capsule; Refill: 0      Piedad ClimesMartin, Chauncy Mangiaracina Cody, [place] JerseyPA-C

## 2016-07-18 ENCOUNTER — Other Ambulatory Visit: Payer: Self-pay | Admitting: Family Medicine

## 2016-07-18 MED FILL — ATORVASTATIN 10 MG TABLET: 10 | 30 days supply | Qty: 30 | Fill #0

## 2016-07-18 MED FILL — AMLODIPINE BESYLATE 2.5 MG: 2.5 | 30 days supply | Qty: 30 | Fill #5

## 2016-07-18 MED FILL — VALSARTAN-HCTZ 320-12.5 MG: 320-12.5 | 30 days supply | Qty: 30 | Fill #0

## 2016-08-20 MED FILL — VALSARTAN-HCTZ 320-12.5 MG: 320-12.5 | 30 days supply | Qty: 30 | Fill #1

## 2016-08-20 MED FILL — ATORVASTATIN 10 MG TABLET: 10 | 30 days supply | Qty: 30 | Fill #1

## 2016-08-20 MED FILL — AMLODIPINE BESYLATE 2.5 MG: 2.5 | 30 days supply | Qty: 30 | Fill #6

## 2016-09-14 ENCOUNTER — Telehealth: Payer: Self-pay | Admitting: Internal Medicine

## 2016-09-14 DIAGNOSIS — G4733 Obstructive sleep apnea (adult) (pediatric): Secondary | ICD-10-CM

## 2016-09-14 NOTE — Telephone Encounter (Signed)
We reviewed home sleep test from 06/11/16-severe OSA. AHI 35.9/hour, desaturated to 84%, body weight 281 pounds. We reviewed the basics of OSA and treatment choices again. She is going to start CPAP auto 5-20 with office return in 30-90 days.

## 2016-09-20 ENCOUNTER — Other Ambulatory Visit: Payer: Self-pay | Admitting: Family Medicine

## 2016-09-20 MED FILL — VALSARTAN-HCTZ 320-12.5 MG: 320-12.5 | 30 days supply | Qty: 30 | Fill #2

## 2016-09-20 MED FILL — AMLODIPINE BESYLATE 2.5 MG: 2.5 | 90 days supply | Qty: 90 | Fill #0

## 2016-09-20 MED FILL — ATORVASTATIN 10 MG TABLET: 10 | 30 days supply | Qty: 30 | Fill #2

## 2016-10-12 ENCOUNTER — Ambulatory Visit: Payer: 59 | Admitting: Family Medicine

## 2016-10-16 ENCOUNTER — Encounter: Payer: Self-pay | Admitting: Family Medicine

## 2016-10-16 ENCOUNTER — Ambulatory Visit (INDEPENDENT_AMBULATORY_CARE_PROVIDER_SITE_OTHER): Payer: 59 | Admitting: Family Medicine

## 2016-10-16 VITALS — BP 123/83 | HR 82 | Temp 97.9°F | Resp 16 | Ht 63.0 in | Wt 276.1 lb

## 2016-10-16 DIAGNOSIS — G4733 Obstructive sleep apnea (adult) (pediatric): Secondary | ICD-10-CM | POA: Diagnosis not present

## 2016-10-16 DIAGNOSIS — E119 Type 2 diabetes mellitus without complications: Secondary | ICD-10-CM

## 2016-10-16 LAB — BASIC METABOLIC PANEL
BUN: 15 mg/dL (ref 6–23)
CALCIUM: 9.4 mg/dL (ref 8.4–10.5)
CO2: 28 mEq/L (ref 19–32)
CREATININE: 0.9 mg/dL (ref 0.40–1.20)
Chloride: 106 mEq/L (ref 96–112)
GFR: 82.17 mL/min (ref >60.00)
Glucose, Bld: 95 mg/dL (ref 70–99)
Potassium: 4 mEq/L (ref 3.5–5.1)
SODIUM: 142 meq/L (ref 135–145)

## 2016-10-16 LAB — HEMOGLOBIN A1C: Hgb A1c MFr Bld: 6.4 % (ref 4.6–6.5)

## 2016-10-16 NOTE — Progress Notes (Signed)
Pre visit review using our clinic review tool, if applicable. No additional management support is needed unless otherwise documented below in the visit note. 

## 2016-10-16 NOTE — Assessment & Plan Note (Signed)
Chronic problem.  Currently diet controlled.  Pt has lost 4 lbs since last visit.  Applauded her efforts.  Stressed continued need for healthy diet and regular exercise.  Check labs.  Adjust meds prn

## 2016-10-16 NOTE — Patient Instructions (Signed)
Follow up in 3-4 months to recheck sugar and cholesterol We'll notify you of your lab results and make any changes if needed Continue to work on healthy diet and regular exercise- you're doing great! Call with any questions or concerns Have a great summer!!!

## 2016-10-16 NOTE — Progress Notes (Signed)
   Subjective:    Patient ID: Ashlee Williams, female    DOB: 04/02/1956, 61 y.o.   MRN: [id number]  HPI DM- chronic problem.  Currently diet controlled.  She is down 4 lbs from last visit.  Pt is doing some walking but majority of weight loss is dietary changes.  UTD on eye exam.  On ARB for renal protection.  Due for foot exam.  No CP, SOB, HAs, visual changes, edema, numbness/tingling of hands/feet.   Review of Systems For ROS see HPI     Objective:   Physical Exam  Constitutional: She is oriented to person, place, and time. She appears well-developed and well-nourished. No distress.  HENT:  Head: Normocephalic and atraumatic.  Eyes: Conjunctivae and EOM are normal. Pupils are equal, round, and reactive to light.  Neck: Normal range of motion. Neck supple. No thyromegaly present.  Cardiovascular: Normal rate, regular rhythm, normal heart sounds and intact distal pulses.   No murmur heard. Pulmonary/Chest: Effort normal and breath sounds normal. No respiratory distress.  Abdominal: Soft. She exhibits no distension. There is no tenderness.  Musculoskeletal: She exhibits no edema.  Lymphadenopathy:    She has no cervical adenopathy.  Neurological: She is alert and oriented to person, place, and time.  Skin: Skin is warm and dry.  Psychiatric: She has a normal mood and affect. Her behavior is normal.  Vitals reviewed.         Assessment & Plan:

## 2016-10-17 ENCOUNTER — Encounter: Payer: Self-pay | Admitting: General Practice

## 2016-10-22 MED FILL — VALSARTAN-HCTZ 320-12.5 MG: 320-12.5 | 30 days supply | Qty: 30 | Fill #3

## 2016-10-22 MED FILL — ATORVASTATIN 10 MG TABLET: 10 | 30 days supply | Qty: 30 | Fill #3

## 2016-10-24 ENCOUNTER — Encounter: Payer: Self-pay | Admitting: General Practice

## 2016-10-24 ENCOUNTER — Telehealth: Payer: Self-pay | Admitting: Family Medicine

## 2016-10-24 NOTE — Telephone Encounter (Signed)
Responded to pt by mychart. Pt pharmacy should contact her to inform if her medication is involved in the recall of valsartan.

## 2016-10-24 NOTE — Telephone Encounter (Signed)
Patient states there is a recall on valsartan-hydrochlorothiazide (DIOVAN-HCT) 320-12.5 MG tablet.  She is requesting pcp to switch her to another medication.  Patient states she is currently at work.  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

## 2016-11-01 ENCOUNTER — Telehealth: Payer: Self-pay | Admitting: Internal Medicine

## 2016-11-01 MED ORDER — ZOLPIDEM TARTRATE 5 MG PO TABS: 5.0000 mg | ORAL_TABLET | Freq: Every evening | ORAL | 1 refills | Status: AC | PRN

## 2016-11-01 NOTE — Telephone Encounter (Signed)
Spoke with CY-patient has not been on CPAP long and pressure looks good according to DL. Okay to Rx Ambien 5 mg #30 take 1 po QHS prn sleep with 1 refill. Rx printed,signed, and given to patient. Nothing more needed at this time.

## 2016-11-02 MED FILL — AZITHROMYCIN 250 MG TAB: 250 | 5 days supply | Qty: 6 | Fill #0

## 2016-11-16 DIAGNOSIS — G4733 Obstructive sleep apnea (adult) (pediatric): Secondary | ICD-10-CM | POA: Diagnosis not present

## 2016-11-26 ENCOUNTER — Encounter: Payer: Self-pay | Admitting: Family Medicine

## 2016-11-28 ENCOUNTER — Ambulatory Visit (INDEPENDENT_AMBULATORY_CARE_PROVIDER_SITE_OTHER): Payer: 59 | Admitting: Physician Assistant

## 2016-11-28 ENCOUNTER — Encounter: Payer: Self-pay | Admitting: Physician Assistant

## 2016-11-28 VITALS — BP 130/84 | HR 62 | Temp 98.3°F | Resp 16 | Ht 63.0 in | Wt 280.0 lb

## 2016-11-28 DIAGNOSIS — J209 Acute bronchitis, unspecified: Secondary | ICD-10-CM | POA: Diagnosis not present

## 2016-11-28 DIAGNOSIS — J4531 Mild persistent asthma with (acute) exacerbation: Secondary | ICD-10-CM

## 2016-11-28 MED ORDER — IPRATROPIUM-ALBUTEROL 0.5-2.5 (3) MG/3ML IN SOLN
3.0000 mL | Freq: Once | RESPIRATORY_TRACT | Status: AC
  Administered 2016-11-28: 3 mL via RESPIRATORY_TRACT

## 2016-11-28 MED ORDER — BENZONATATE 100 MG PO CAPS: 100.0000 mg | ORAL_CAPSULE | Freq: Two times a day (BID) | ORAL | 0 refills | Status: DC | PRN

## 2016-11-28 MED ORDER — AZITHROMYCIN 250 MG PO TABS: ORAL_TABLET | ORAL | 0 refills | Status: DC

## 2016-11-28 MED ORDER — PREDNISONE 20 MG PO TABS: 40.0000 mg | ORAL_TABLET | Freq: Every day | ORAL | 0 refills | Status: DC

## 2016-11-28 MED FILL — ATORVASTATIN 10 MG TABLET: 10 | 30 days supply | Qty: 30 | Fill #4

## 2016-11-28 MED FILL — AZITHROMYCIN 250 MG TAB: 250 | 5 days supply | Qty: 6 | Fill #0

## 2016-11-28 MED FILL — predniSONE 20 MG TABS: 20 | 5 days supply | Qty: 10 | Fill #0

## 2016-11-28 MED FILL — VALSARTAN-HCTZ 320-12.5 MG: 320-12.5 | 30 days supply | Qty: 30 | Fill #4

## 2016-11-28 MED FILL — BENZONATATE 100 MG CAP: 100 | 10 days supply | Qty: 20 | Fill #0

## 2016-11-28 NOTE — Progress Notes (Signed)
Pre visit review using our clinic review tool, if applicable. No additional management support is needed unless otherwise documented below in the visit note. 

## 2016-11-28 NOTE — Progress Notes (Signed)
Patient presents to clinic today c/o several days of wheezing with chest congestion and cough that is now productive of yellow sputum. Endorses sputum is thick. Has noted increased use of Albuterol inhaler to help with wheezing. Denies chest pain. Denies fever, chills. Notes fatigue. Denies recent travel or sick contact. Has start Mucinex to help with congestion. .   Past Medical History:  Diagnosis Date  . AAA (abdominal aortic aneurysm) (HCC)   . Allergy   . Anemia   . Asthma   . Blood in stool   . Bronchitis   . Chicken pox   . Chronic headaches   . Diabetes mellitus    PT. IS TRYING TO CONTROL WITH DIET BUT AS NOTED WEIGHT IS UP  . Hypertension   . Obesity   . Tobacco abuse     Current Outpatient Prescriptions on File Prior to Visit  Medication Sig Dispense Refill  . amLODipine (NORVASC) 2.5 MG tablet TAKE 1 TABLET BY MOUTH ONCE DAILY 30 tablet 6  . APAP-Isometheptene-Dichloral (MIDRIN) 325-65-100 MG CAPS Take 1 capsule by mouth every 6 (six) hours as needed.     Marland Kitchen atorvastatin (LIPITOR) 10 MG tablet TAKE 1 TABLET BY MOUTH DAILY. 30 tablet 6  . calcium-vitamin D (CALCIUM 500/D) 500-200 MG-UNIT per tablet Take 1 tablet by mouth.    . cetirizine (ZYRTEC) 10 MG tablet Take 10 mg by mouth daily.    . Multiple Vitamins-Minerals (MULTIVITAMIN WITH MINERALS) tablet Take 1 tablet by mouth daily.    . ondansetron (ZOFRAN ODT) 4 MG disintegrating tablet Take 1 tablet (4 mg total) by mouth every 8 (eight) hours as needed for nausea or vomiting. 20 tablet 0  . valsartan-hydrochlorothiazide (DIOVAN-HCT) 320-12.5 MG tablet TAKE 1 TABLET BY MOUTH DAILY. 30 tablet 6  . VENTOLIN HFA 108 (90 Base) MCG/ACT inhaler INHALE 2 PUFFS INTO THE LUNGS EVERY 4 HOURS AS NEEDED FOR WHEEZING OR SHORTNESS OF BREATH. 18 g 6  . zolpidem (AMBIEN) 5 MG tablet Take 1 tablet (5 mg total) by mouth at bedtime as needed for sleep. 30 tablet 1   No current facility-administered medications on file prior to visit.      No Known Allergies  Family History  Problem Relation Age of Onset  . Hypertension Mother   . Stroke Mother   . Hypertension Father   . Diabetes Sister   . Hypertension Sister   . Cancer Maternal Aunt   . Hypertension Maternal Aunt   . Stroke Maternal Grandmother   . Hypertension Maternal Grandmother   . Colon cancer Maternal Grandmother 7  . Cancer Maternal Grandfather   . Hypertension Maternal Grandfather   . Hypertension Brother   . Hypertension Maternal Uncle   . Hypertension Paternal Aunt   . Hypertension Paternal Uncle   . Hypertension Paternal Grandmother   . Hypertension Paternal Grandfather     Social History   Social History  . Marital status: Single    Spouse name: N/A  . Number of children: N/A  . Years of education: N/A   Social History Main Topics  . Smoking status: Current Every Day Smoker    Packs/day: 0.50    Years: 10.00    Types: Cigarettes    Last attempt to quit: 07/09/1999  . Smokeless tobacco: Never Used  . Alcohol use 0.0 oz/week  . Drug use: No  . Sexual activity: Yes   Other Topics Concern  . None   Social History Narrative  . None   Review of  Systems - See HPI.  All other ROS are negative.  BP 130/84   Pulse 62   Temp 98.3 F (36.8 C) (Oral)   Resp 16   Ht 5\' 3"  (1.6 m)   Wt 280 lb (127 kg)   LMP 11/10/2010   SpO2 94%   BMI 49.60 kg/m   Physical Exam  Constitutional: She is oriented to person, place, and time and well-developed, well-nourished, and in no distress.  HENT:  Head: Normocephalic and atraumatic.  Right Ear: External ear normal.  Left Ear: External ear normal.  Nose: Nose normal.  Mouth/Throat: Oropharynx is clear and moist. No oropharyngeal exudate.  TM within normal limits bilaterally  Eyes: Conjunctivae are normal.  Neck: Neck supple.  Cardiovascular: Normal rate, regular rhythm, normal heart sounds and intact distal pulses.   Pulmonary/Chest: Effort normal. No respiratory distress. She has  wheezes. She has no rales. She exhibits no tenderness.  Lymphadenopathy:    She has no cervical adenopathy.  Neurological: She is alert and oriented to person, place, and time.  Skin: Skin is warm and dry. No rash noted.  Psychiatric: Affect normal.  Vitals reviewed.  Recent Results (from the past 2160 hour(s))  Hemoglobin A1c     Status: None   Collection Time: 10/16/16  9:46 AM  Result Value Ref Range   Hgb A1c MFr Bld 6.4 4.6 - 6.5 %    Comment: Glycemic Control Guidelines for People with Diabetes:Non Diabetic:  <6%Goal of Therapy: <7%Additional Action Suggested:  >8%   Basic metabolic panel     Status: None   Collection Time: 10/16/16  9:46 AM  Result Value Ref Range   Sodium 142 135 - 145 mEq/L   Potassium 4.0 3.5 - 5.1 mEq/L   Chloride 106 96 - 112 mEq/L   CO2 28 19 - 32 mEq/L   Glucose, Bld 95 70 - 99 mg/dL   BUN 15 6 - 23 mg/dL   Creatinine, Ser 0.38 0.40 - 1.20 mg/dL   Calcium 9.4 8.4 - 33.3 mg/dL   GFR 83.29 >19.16 mL/min    Assessment/Plan: 1. Mild persistent asthma with acute bronchitis and acute exacerbation Duoneb given in office with improvement in wheeze. Will start 5-day burst of prednisone. Rx Azithromycin. Continue mucinex. Increase fluids. Supportive measures and OTC medications reviewed.    Piedad Climes, PA-C

## 2016-11-28 NOTE — Patient Instructions (Signed)
Take antibiotic (Azithromycin) as directed.  Increase fluids.  Get plenty of rest. Use Mucinex for congestion. Take steroid as directed for wheezing. Tessalon Perles for cough. Take a daily probiotic (I recommend Align or Culturelle, but even Activia Yogurt may be beneficial).  A humidifier placed in the bedroom may offer some relief for a dry, scratchy throat of nasal irritation.  Read information below on acute bronchitis. Please call or return to clinic if symptoms are not improving.  Acute Bronchitis Bronchitis is when the airways that extend from the windpipe into the lungs get red, puffy, and painful (inflamed). Bronchitis often causes thick spit (mucus) to develop. This leads to a cough. A cough is the most common symptom of bronchitis. In acute bronchitis, the condition usually begins suddenly and goes away over time (usually in 2 weeks). Smoking, allergies, and asthma can make bronchitis worse. Repeated episodes of bronchitis may cause more lung problems.  HOME CARE  Rest.  Drink enough fluids to keep your pee (urine) clear or pale yellow (unless you need to limit fluids as told by your doctor).  Only take over-the-counter or prescription medicines as told by your doctor.  Avoid smoking and secondhand smoke. These can make bronchitis worse. If you are a smoker, think about using nicotine gum or skin patches. Quitting smoking will help your lungs heal faster.  Reduce the chance of getting bronchitis again by:  Washing your hands often.  Avoiding people with cold symptoms.  Trying not to touch your hands to your mouth, nose, or eyes.  Follow up with your doctor as told.  GET HELP IF: Your symptoms do not improve after 1 week of treatment. Symptoms include:  Cough.  Fever.  Coughing up thick spit.  Body aches.  Chest congestion.  Chills.  Shortness of breath.  Sore throat.  GET HELP RIGHT AWAY IF:   You have an increased fever.  You have chills.  You have  severe shortness of breath.  You have bloody thick spit (sputum).  You throw up (vomit) often.  You lose too much body fluid (dehydration).  You have a severe headache.  You faint.  MAKE SURE YOU:   Understand these instructions.  Will watch your condition.  Will get help right away if you are not doing well or get worse. Document Released: 09/12/2007 Document Revised: 11/26/2012 Document Reviewed: 09/16/2012 [facility] Patient Information 2015 [place], [place]. This information is not intended to replace advice given to you by your health care provider. Make sure you discuss any questions you have with your health care provider.

## 2016-11-29 ENCOUNTER — Encounter (HOSPITAL_COMMUNITY): Payer: Self-pay | Admitting: Emergency Medicine

## 2016-11-29 ENCOUNTER — Ambulatory Visit: Payer: 59 | Admitting: Physician Assistant

## 2016-11-29 ENCOUNTER — Telehealth: Payer: Self-pay

## 2016-11-29 ENCOUNTER — Ambulatory Visit (HOSPITAL_COMMUNITY)
Admission: EM | Admit: 2016-11-29 | Discharge: 2016-11-29 | Disposition: A | Discharge status: Home / Self Care | Payer: 59 | Attending: Emergency Medicine | Admitting: Emergency Medicine

## 2016-11-29 DIAGNOSIS — J00 Acute nasopharyngitis [common cold]: Secondary | ICD-10-CM | POA: Diagnosis not present

## 2016-11-29 DIAGNOSIS — G44209 Tension-type headache, unspecified, not intractable: Secondary | ICD-10-CM

## 2016-11-29 MED ORDER — KETOROLAC TROMETHAMINE 30 MG/ML IJ SOLN
INTRAMUSCULAR | Status: AC
  Filled 2016-11-29: qty 1

## 2016-11-29 MED ORDER — IPRATROPIUM BROMIDE 0.06 % NA SOLN: 2.0000 | Freq: Four times a day (QID) | NASAL | 0 refills | Status: AC

## 2016-11-29 MED ORDER — KETOROLAC TROMETHAMINE 30 MG/ML IJ SOLN
30.0000 mg | Freq: Once | INTRAMUSCULAR | Status: AC
  Administered 2016-11-29: 30 mg via INTRAMUSCULAR

## 2016-11-29 MED ORDER — BUTALBITAL-APAP-CAFFEINE 50-325-40 MG PO TABS: 1.0000 | ORAL_TABLET | Freq: Four times a day (QID) | ORAL | 0 refills | Status: AC | PRN

## 2016-11-29 NOTE — Discharge Instructions (Signed)
Continue abx's steroids and cough meds rx'd by PCP

## 2016-11-29 NOTE — Telephone Encounter (Signed)
Patient called to schedule acute appointment for severe headache and numbness on right side of face. Symptoms started this morning, stating SOB and CP are no worse than normal with recent dx of bronchitis. Advised patient to go to Urgent Care or Emergency Room for evaluation, patient verbalized understanding.

## 2016-11-29 NOTE — Telephone Encounter (Signed)
Agree w/ need for urgent evaluation in ER based on severe HA and facial numbness

## 2016-11-29 NOTE — ED Triage Notes (Signed)
PT saw her PCP yesterday for bronchitis symptoms. Frontal left side headache started last night. PT reports she woke up with headache in the same place and had left facial numbness this AM. PT reports taste buds are different today as well. No facial droop or palsy noted. Speech normal. No arm weakness noted. Equal grips.   History of migraines

## 2016-11-29 NOTE — ED Provider Notes (Signed)
[facility]   [id number] 11/29/16 Arrival Time: 1651  ASSESSMENT & PLAN:  1. Acute non intractable tension-type headache   2. Nasopharyngitis     Meds ordered this encounter  Medications  . ipratropium (ATROVENT) 0.06 % nasal spray    Sig: Place 2 sprays into both nostrils 4 (four) times daily.    Dispense:  15 mL    Refill:  0    Order Specific Question:   Supervising Provider    Answer:   Domenick Gong [[id number]]  . butalbital-acetaminophen-caffeine (FIORICET, ESGIC) 50-325-40 MG tablet    Sig: Take 1-2 tablets by mouth every 6 (six) hours as needed for headache.    Dispense:  20 tablet    Refill:  0    Order Specific Question:   Supervising Provider    Answer:   Domenick Gong [[id number]]  . ketorolac (TORADOL) 30 MG/ML injection 30 mg    Reviewed expectations re: course of current medical issues. Questions answered. Outlined signs and symptoms indicating need for more acute intervention. Patient verbalized understanding. After Visit Summary given.   SUBJECTIVE:  Ashlee Williams is a 61 y.o. female who presents with complaint of URI sx's, bronchitis, headache, and facial discomfort and congestion which is worse on left side.  She has been treated by pcp for bronchitis with abx's prednisone and cough medicine.  ROS: As per HPI.   OBJECTIVE:  Vitals:   11/29/16 1733 11/29/16 1734  BP:  (!) 165/90  Pulse:  (!) 56  Resp:  16  Temp:  98.7 F (37.1 C)  SpO2:  96%  Weight: 280 lb (127 kg)   Height: 5\' 3"  (1.6 m)      General appearance: alert; no distress Eyes: PERRLA; EOMI; conjunctiva normal HENT: normocephalic; atraumatic; TMs normal; nasal mucosa normal; oral mucosa normal Neck: supple Lungs: Scattered Exp wheezes     Heart: regular rate and rhythm HEENT - Head AT  Abdomen: soft, non-tender; bowel sounds normal; no masses or organomegaly; no guarding or rebound tenderness Back: no CVA tenderness Extremities: no cyanosis or edema;  symmetrical with no gross deformities Skin: warm and dry Neurologic: normal gait; normal symmetric reflexes Psychological: alert and cooperative; normal mood and affect  Past Medical History:  Diagnosis Date  . AAA (abdominal aortic aneurysm) (HCC)   . Allergy   . Anemia   . Asthma   . Blood in stool   . Bronchitis   . Chicken pox   . Chronic headaches   . Diabetes mellitus    PT. IS TRYING TO CONTROL WITH DIET BUT AS NOTED WEIGHT IS UP  . Hypertension   . Obesity   . Tobacco abuse      has a past medical history of AAA (abdominal aortic aneurysm) (HCC); Allergy; Anemia; Asthma; Blood in stool; Bronchitis; Chicken pox; Chronic headaches; Diabetes mellitus; Hypertension; Obesity; and Tobacco abuse.  Results for orders placed or performed in visit on 10/16/16  Hemoglobin A1c  Result Value Ref Range   Hgb A1c MFr Bld 6.4 4.6 - 6.5 %  Basic metabolic panel  Result Value Ref Range   Sodium 142 135 - 145 mEq/L   Potassium 4.0 3.5 - 5.1 mEq/L   Chloride 106 96 - 112 mEq/L   CO2 28 19 - 32 mEq/L   Glucose, Bld 95 70 - 99 mg/dL   BUN 15 6 - 23 mg/dL   Creatinine, Ser 4.09 0.40 - 1.20 mg/dL   Calcium 9.4 8.4 - 81.1 mg/dL  GFR 82.17 >60.00 mL/min  HM DIABETES EYE EXAM  Result Value Ref Range   HM Diabetic Eye Exam No Retinopathy No Retinopathy    Labs Reviewed - No data to display  Imaging: No results found.  No Known Allergies  Family History  Problem Relation Age of Onset  . Hypertension Mother   . Stroke Mother   . Hypertension Father   . Diabetes Sister   . Hypertension Sister   . Cancer Maternal Aunt   . Hypertension Maternal Aunt   . Stroke Maternal Grandmother   . Hypertension Maternal Grandmother   . Colon cancer Maternal Grandmother 68  . Cancer Maternal Grandfather   . Hypertension Maternal Grandfather   . Hypertension Brother   . Hypertension Maternal Uncle   . Hypertension Paternal Aunt   . Hypertension Paternal Uncle   . Hypertension Paternal  Grandmother   . Hypertension Paternal Grandfather    Past Surgical History:  Procedure Laterality Date  . ABDOMINAL HYSTERECTOMY  1998  . CHOLECYSTECTOMY N/A 03/16/2015   Procedure: LAPAROSCOPIC CHOLECYSTECTOMY;  Surgeon: Jimmye Norman, MD;  Location: [facility] OR;  Service: General;  Laterality: N/A;         Deatra Canter, FNP 11/29/16 2117

## 2016-11-30 MED FILL — ZOLPIDEM TARTRATE 5 MG TAB: 5 | 30 days supply | Qty: 30 | Fill #0

## 2016-11-30 MED FILL — BUTALB-ACETAMIN-CAFF 50-325: 50-325-40 | 3 days supply | Qty: 20 | Fill #0

## 2016-11-30 MED FILL — IPRATROPIUM 0.06% SPRAY: 0.06 | 30 days supply | Qty: 15 | Fill #0

## 2016-12-03 ENCOUNTER — Ambulatory Visit (INDEPENDENT_AMBULATORY_CARE_PROVIDER_SITE_OTHER): Payer: 59 | Admitting: Family Medicine

## 2016-12-03 ENCOUNTER — Encounter: Payer: Self-pay | Admitting: General Practice

## 2016-12-03 ENCOUNTER — Telehealth: Payer: Self-pay | Admitting: *Deleted

## 2016-12-03 ENCOUNTER — Encounter: Payer: Self-pay | Admitting: Family Medicine

## 2016-12-03 VITALS — BP 136/88 | HR 90 | Temp 98.1°F | Resp 16 | Ht 63.0 in | Wt 273.0 lb

## 2016-12-03 DIAGNOSIS — J209 Acute bronchitis, unspecified: Secondary | ICD-10-CM | POA: Diagnosis not present

## 2016-12-03 DIAGNOSIS — J0110 Acute frontal sinusitis, unspecified: Secondary | ICD-10-CM

## 2016-12-03 MED ORDER — PREDNISONE 10 MG PO TABS: ORAL_TABLET | ORAL | 0 refills | Status: DC

## 2016-12-03 MED ORDER — IPRATROPIUM-ALBUTEROL 0.5-2.5 (3) MG/3ML IN SOLN
3.0000 mL | Freq: Once | RESPIRATORY_TRACT | Status: AC
  Administered 2016-12-03: 3 mL via RESPIRATORY_TRACT

## 2016-12-03 MED ORDER — AMOXICILLIN 875 MG PO TABS: 875.0000 mg | ORAL_TABLET | Freq: Two times a day (BID) | ORAL | 0 refills | Status: DC

## 2016-12-03 MED FILL — predniSONE 10 MG TABS: 10 | 9 days supply | Qty: 18 | Fill #0

## 2016-12-03 MED FILL — AMOXICILLIN 875 MG TABLET: 875 | 10 days supply | Qty: 20 | Fill #0

## 2016-12-03 NOTE — Patient Instructions (Signed)
Follow up as needed/scheduled Start the Amoxicillin twice daily- take w/ food Drink plenty of fluids Continue to use your inhalers Restart the Prednisone- 3 tabs x3 days, 2 tabs x3 days, and then 1 tab x3 days. Try and stop smoking! Call with any questions or concerns Hang in there!!!

## 2016-12-03 NOTE — Progress Notes (Signed)
   Subjective:    Patient ID: Ashlee Williams, female    DOB: 1955-11-29, 61 y.o.   MRN: [id number]  HPI UC f/u- pt saw Selena Batten on 8/22 and was prescribed Zpack, Prednisone, and cough meds.  Was seen the following day at [facility] for HA and given Toradol and Fioricet.  She finished both the Prednisone and abx.  Still coughing.  Continues to have headache.  Having L facial numbness.  No tooth pain.  No fevers.     Review of Systems For ROS see HPI     Objective:   Physical Exam  Constitutional: She is oriented to person, place, and time. She appears well-developed and well-nourished. No distress.  HENT:  Head: Normocephalic and atraumatic.  Right Ear: Tympanic membrane normal.  Left Ear: Tympanic membrane normal.  Nose: Mucosal edema and rhinorrhea present. Right sinus exhibits frontal sinus tenderness. Right sinus exhibits no maxillary sinus tenderness. Left sinus exhibits frontal sinus tenderness. Left sinus exhibits no maxillary sinus tenderness.  Mouth/Throat: Uvula is midline and mucous membranes are normal. Posterior oropharyngeal erythema present. No oropharyngeal exudate.  Eyes: Pupils are equal, round, and reactive to light. Conjunctivae and EOM are normal.  Neck: Normal range of motion. Neck supple.  Cardiovascular: Normal rate, regular rhythm and normal heart sounds.   Pulmonary/Chest: Effort normal. No respiratory distress. She has wheezes (diffuse expiratory wheezes- improved s/p neb tx).  Lymphadenopathy:    She has no cervical adenopathy.  Neurological: She is alert and oriented to person, place, and time. No cranial nerve deficit. Coordination normal.  Skin: Skin is warm and dry.  Psychiatric: She has a normal mood and affect. Her behavior is normal. Thought content normal.  Vitals reviewed.         Assessment & Plan:  Sinusitis- new.  Pt's sxs and PE consistent w/ infxn.  Start high dose Amox.  Prednisone for what appears to be nerve irritation due to sinus infection.  No  abnormality on neuro exam today.  Reviewed supportive care and red flags that should prompt return.  Pt expressed understanding and is in agreement w/ plan.

## 2016-12-03 NOTE — Telephone Encounter (Signed)
FYI

## 2016-12-03 NOTE — Assessment & Plan Note (Signed)
Deteriorated.  Pt has diffuse wheezing and deep, hacking cough.  Has completed her 5 days of prednisone but needs more based on the degree of wheezing.  Start Amoxicillin, extend prednisone and taper.  Continue albuterol.  Reviewed supportive care and red flags that should prompt return.  Pt expressed understanding and is in agreement w/ plan.

## 2016-12-03 NOTE — Telephone Encounter (Signed)
FMLA paperwork received via fax this morning.   Placed in provider bin with charge sheet.

## 2016-12-03 NOTE — Progress Notes (Signed)
Pre visit review using our clinic review tool, if applicable. No additional management support is needed unless otherwise documented below in the visit note. 

## 2016-12-05 ENCOUNTER — Telehealth: Payer: Self-pay | Admitting: Family Medicine

## 2016-12-05 NOTE — Telephone Encounter (Signed)
Ok to remain at work

## 2016-12-05 NOTE — Telephone Encounter (Signed)
Pt is at work today but is still congested and hoarse. Was directed by her manager to call you and let you know and seek advice on if she should stay at work. Please call her work number and they will get her to the phone.

## 2016-12-05 NOTE — Telephone Encounter (Signed)
Please advise 

## 2016-12-05 NOTE — Telephone Encounter (Signed)
Pt notified ok to stay at work and advised to drink her water. The hoarseness will go away it just may take some time.

## 2016-12-05 NOTE — Telephone Encounter (Signed)
Forms completed and placed in bin 

## 2016-12-06 ENCOUNTER — Telehealth: Payer: Self-pay | Admitting: Family Medicine

## 2016-12-06 ENCOUNTER — Ambulatory Visit (INDEPENDENT_AMBULATORY_CARE_PROVIDER_SITE_OTHER)
Admission: RE | Admit: 2016-12-06 | Discharge: 2016-12-06 | Disposition: A | Discharge status: Home / Self Care | Payer: 59 | Source: Ambulatory Visit | Attending: Internal Medicine | Admitting: Internal Medicine

## 2016-12-06 ENCOUNTER — Other Ambulatory Visit: Payer: Self-pay | Admitting: Internal Medicine

## 2016-12-06 DIAGNOSIS — R0602 Shortness of breath: Secondary | ICD-10-CM | POA: Diagnosis not present

## 2016-12-06 DIAGNOSIS — F17200 Nicotine dependence, unspecified, uncomplicated: Secondary | ICD-10-CM | POA: Diagnosis not present

## 2016-12-06 DIAGNOSIS — J189 Pneumonia, unspecified organism: Secondary | ICD-10-CM

## 2016-12-06 DIAGNOSIS — R05 Cough: Secondary | ICD-10-CM | POA: Diagnosis not present

## 2016-12-06 NOTE — Telephone Encounter (Signed)
Received FMLA forms by fax, placed in bin up front with charge sheet.

## 2016-12-06 NOTE — Telephone Encounter (Signed)
Forms have been faxed 

## 2016-12-06 NOTE — Telephone Encounter (Signed)
fyi

## 2016-12-06 NOTE — Telephone Encounter (Signed)
Done and placed in basket 

## 2016-12-07 NOTE — Telephone Encounter (Signed)
Forms have been faxed 

## 2016-12-12 ENCOUNTER — Other Ambulatory Visit: Payer: Self-pay | Admitting: Family Medicine

## 2016-12-12 ENCOUNTER — Encounter: Payer: Self-pay | Admitting: Family Medicine

## 2016-12-12 DIAGNOSIS — J3489 Other specified disorders of nose and nasal sinuses: Secondary | ICD-10-CM

## 2016-12-16 ENCOUNTER — Encounter: Payer: Self-pay | Admitting: Internal Medicine

## 2016-12-17 DIAGNOSIS — G4733 Obstructive sleep apnea (adult) (pediatric): Secondary | ICD-10-CM | POA: Diagnosis not present

## 2016-12-18 ENCOUNTER — Ambulatory Visit (INDEPENDENT_AMBULATORY_CARE_PROVIDER_SITE_OTHER): Payer: 59 | Admitting: Internal Medicine

## 2016-12-18 ENCOUNTER — Encounter: Payer: Self-pay | Admitting: Internal Medicine

## 2016-12-18 VITALS — BP 116/74 | HR 77 | Ht 63.0 in | Wt 270.0 lb

## 2016-12-18 DIAGNOSIS — J0101 Acute recurrent maxillary sinusitis: Secondary | ICD-10-CM

## 2016-12-18 DIAGNOSIS — G4733 Obstructive sleep apnea (adult) (pediatric): Secondary | ICD-10-CM

## 2016-12-18 DIAGNOSIS — Z720 Tobacco use: Secondary | ICD-10-CM

## 2016-12-18 MED ORDER — AZELASTINE-FLUTICASONE 137-50 MCG/ACT NA SUSP: 2.0000 | Freq: Every day | NASAL | 0 refills | Status: DC

## 2016-12-18 NOTE — Patient Instructions (Signed)
Order- DME [facility]- we can continue CPAP auto 5-20, mask of choice, humidifier, supplies, AirView    Dx OSA  Sample Dymista nasal spray      1-2 puffs each nostril once daily    Try this instead of Atrovent/ipratropium nasal spray for comparison.  You can also use an otc nasal saline spray as needed. Ok to use Sudafed from the pharmacy as a decongestant if needed.  Order- office spirometry    Dx Tobacco user  Please try to stop smoking Ashlee Williams- you will be better off.

## 2016-12-18 NOTE — Progress Notes (Signed)
06/11/2016- 61 year old female smoker referred courtesy of Dr Beverely Lowabori; never had sleep study-pt can take HST machine home today. Pt states she is sleepy during the day, fatigued,headaches. Pt sleeps about 7-8 hours-feels like she sleeps well-wakes about 2-3 times during the night for restroom breaks; falls right back to sleep. Alone. Aware that she snores loudly. Not aware of sleep disturbance from limb movements or parasomnias. No sleep medicines. One cup of coffee. No ENT surgery. History asthmatic bronchitis with only occasional use of albuterol inhaler. No routine wheeze or cough. Some dyspnea with exertion. Denies history of thyroid or heart problems. Managed hypertension. Chronic headaches.  12/18/16- 61 year old female smoker followed for OSA, complicated by morbid obesity, tobacco use,AAA, DM 2, HBP Unattended Home Sleep Test 06/11/16-AHI 35.9/hour, desaturation to 84%, body weight 281 pounds CPAP auto 5-20/[facility] Pending ENT Dr Suszanne Connerseoh recently for sinus pressure. FOLLOWS FOR: DME: [facility] Pt wears CPAP nighlty for at least 6 hours.DL attached. Pt feels congested in nasal area as well for a while and hard to keep mask on and breathe thorugh her nose.  Download indicates 77% for compliance, averaging 6 hours and 15 minutes per night with AHI 1.0/hour. She is using nasal pillows mask and says pressure is very comfortable. She was sleeping better with CPAP control of nasal congestion interfered. Bothersome nasal congestion in the last couple of weeks, long after starting CPAP. Some frontal headache at times, nasal congestion with little drainage and nothing purulent. Chest feels clear despite ongoing smoking with no cough or wheeze. Office Spirometry 12/18/16-WNL. FVC 2.34/92%, FEV1 1.97/99%, ratio 0.84, FEF 25-75% 2.48/124%  ROS-see HPI   + = positive Constitutional:    weight loss, night sweats, fevers, chills, + fatigue, lassitude. HEENT:    + headaches, difficulty swallowing, tooth/dental problems,  sore throat,       sneezing, itching, ear ache, + nasal congestion, post nasal drip, snoring CV:    chest pain, orthopnea, PND, swelling in lower extremities, anasarca,                                                        dizziness, palpitations Resp:   + shortness of breath with exertion or at rest.                productive cough,   non-productive cough, coughing up of blood.              change in color of mucus.  wheezing.   Skin:    rash or lesions. GI:  No-   heartburn, indigestion, abdominal pain, nausea, vomiting, diarrhea,                 change in bowel habits, loss of appetite GU: dysuria, change in color of urine, no urgency or frequency.   flank pain. MS:   joint pain, stiffness, decreased range of motion, back pain. Neuro-     nothing unusual Psych:  change in mood or affect.  depression or anxiety.   memory loss.  OBJ- Physical Exam General- Alert, Oriented, Affect-appropriate, Distress- none acute, + morbidly obese Skin- rash-none, lesions- none, excoriation- none Lymphadenopathy- none Head- atraumatic            Eyes- Gross vision intact, PERRLA, conjunctivae and secretions clear            Ears-  Hearing, canals-normal            Nose- + turbinate edema, no-Septal dev, mucus, polyps, erosion, perforation             Throat- Mallampati III-IV , mucosa clear , drainage- none, tonsils- atrophic, + hoarse Neck- flexible , trachea midline, no stridor , thyroid nl, carotid no bruit Chest - symmetrical excursion , unlabored           Heart/CV- RRR , no murmur , no gallop  , no rub, nl s1 s2                           - JVD- none , edema- none, stasis changes- none, varices- none           Lung- clear to P&A, wheeze- none, cough- none , dullness-none, rub- none           Chest wall-  Abd-  Br/ Gen/ Rectal- Not done, not indicated Extrem- cyanosis- none, clubbing, none, atrophy- none, strength- nl Neuro- grossly intact to observation

## 2016-12-18 NOTE — Assessment & Plan Note (Deleted)
Atrovent may be over drying her nasal mucosa. We discussed Flonase and nasal saline but I can give a sample of Dymista here to try initially for comparison. I don't know that she has a bacterial sinus infection so we will try symptomatic therapy initially. Rhinitis is likely aggravated by her smoking and possibly by airflow from her CPAP. She is encouraged to keep her appointment with ENT.

## 2016-12-18 NOTE — Assessment & Plan Note (Signed)
Smoking cessation is encouraged again.

## 2016-12-18 NOTE — Assessment & Plan Note (Signed)
Atrovent may be over drying her nasal mucosa. We discussed Flonase and nasal saline but I can give a sample of Dymista here to try initially for comparison. I don't know that she has a bacterial sinus infection so we will try symptomatic therapy initially. Rhinitis is likely aggravated by her smoking and possibly by airflow from her CPAP. She is encouraged to keep her appointment with ENT.She probably has a significant nonallergic, vasomotor rhinitis component.

## 2016-12-18 NOTE — Assessment & Plan Note (Signed)
Severe obstructive sleep apnea confirmed by home sleep test. Appropriate education done. She indicates she was doing better with CPAP until nasal congestion began interfering. They directed jet of air through her nasal pillows mask may cause some vasomotor congestion but she says it is comfortable and she doesn't think these are connected issues.

## 2016-12-22 ENCOUNTER — Encounter (HOSPITAL_COMMUNITY): Payer: Self-pay

## 2016-12-22 ENCOUNTER — Emergency Department (HOSPITAL_COMMUNITY): Payer: 59

## 2016-12-22 ENCOUNTER — Observation Stay (HOSPITAL_COMMUNITY)
Admission: EM | Admit: 2016-12-22 | Discharge: 2016-12-24 | Disposition: A | Discharge status: Home / Self Care | Payer: 59 | Attending: Internal Medicine | Admitting: Internal Medicine

## 2016-12-22 DIAGNOSIS — R55 Syncope and collapse: Secondary | ICD-10-CM | POA: Diagnosis not present

## 2016-12-22 DIAGNOSIS — R404 Transient alteration of awareness: Secondary | ICD-10-CM | POA: Diagnosis not present

## 2016-12-22 DIAGNOSIS — G4733 Obstructive sleep apnea (adult) (pediatric): Secondary | ICD-10-CM | POA: Insufficient documentation

## 2016-12-22 DIAGNOSIS — E1169 Type 2 diabetes mellitus with other specified complication: Secondary | ICD-10-CM | POA: Diagnosis present

## 2016-12-22 DIAGNOSIS — F17210 Nicotine dependence, cigarettes, uncomplicated: Secondary | ICD-10-CM | POA: Diagnosis not present

## 2016-12-22 DIAGNOSIS — R51 Headache: Secondary | ICD-10-CM | POA: Diagnosis not present

## 2016-12-22 DIAGNOSIS — Z720 Tobacco use: Secondary | ICD-10-CM | POA: Diagnosis present

## 2016-12-22 DIAGNOSIS — E119 Type 2 diabetes mellitus without complications: Secondary | ICD-10-CM | POA: Diagnosis not present

## 2016-12-22 DIAGNOSIS — Z79899 Other long term (current) drug therapy: Secondary | ICD-10-CM | POA: Diagnosis not present

## 2016-12-22 DIAGNOSIS — I10 Essential (primary) hypertension: Secondary | ICD-10-CM | POA: Diagnosis not present

## 2016-12-22 DIAGNOSIS — R911 Solitary pulmonary nodule: Secondary | ICD-10-CM | POA: Insufficient documentation

## 2016-12-22 DIAGNOSIS — E785 Hyperlipidemia, unspecified: Secondary | ICD-10-CM | POA: Diagnosis not present

## 2016-12-22 DIAGNOSIS — J40 Bronchitis, not specified as acute or chronic: Secondary | ICD-10-CM | POA: Diagnosis present

## 2016-12-22 DIAGNOSIS — R2689 Other abnormalities of gait and mobility: Secondary | ICD-10-CM | POA: Insufficient documentation

## 2016-12-22 DIAGNOSIS — Z6841 Body Mass Index (BMI) 40.0 and over, adult: Secondary | ICD-10-CM | POA: Diagnosis not present

## 2016-12-22 DIAGNOSIS — I714 Abdominal aortic aneurysm, without rupture: Secondary | ICD-10-CM | POA: Insufficient documentation

## 2016-12-22 DIAGNOSIS — K0889 Other specified disorders of teeth and supporting structures: Secondary | ICD-10-CM

## 2016-12-22 HISTORY — DX: Syncope and collapse: R55

## 2016-12-22 LAB — URINALYSIS, ROUTINE W REFLEX MICROSCOPIC
Bilirubin Urine: NEGATIVE
GLUCOSE, UA: NEGATIVE mg/dL
Hgb urine dipstick: NEGATIVE
KETONES UR: NEGATIVE mg/dL
LEUKOCYTES UA: NEGATIVE
NITRITE: NEGATIVE
PH: 6 (ref 5.0–8.0)
Protein, ur: NEGATIVE mg/dL
SPECIFIC GRAVITY, URINE: 1.039 — AB (ref 1.005–1.030)

## 2016-12-22 LAB — BASIC METABOLIC PANEL
Anion gap: 7 (ref 5–15)
BUN: 10 mg/dL (ref 6–20)
CALCIUM: 8.9 mg/dL (ref 8.9–10.3)
CO2: 27 mmol/L (ref 22–32)
CREATININE: 0.9 mg/dL (ref 0.44–1.00)
Chloride: 106 mmol/L (ref 101–111)
GFR calc Af Amer: >60 mL/min (ref >60)
GLUCOSE: 84 mg/dL (ref 65–99)
Potassium: 4 mmol/L (ref 3.5–5.1)
Sodium: 140 mmol/L (ref 135–145)

## 2016-12-22 LAB — CBC
HCT: 38.6 % (ref 36.0–46.0)
Hemoglobin: 12.1 g/dL (ref 12.0–15.0)
MCH: 26 pg (ref 26.0–34.0)
MCHC: 31.3 g/dL (ref 30.0–36.0)
MCV: 82.8 fL (ref 78.0–100.0)
Platelets: 205 10*3/uL (ref 150–400)
RBC: 4.66 MIL/uL (ref 3.87–5.11)
RDW: 15.7 % — AB (ref 11.5–15.5)
WBC: 6.8 10*3/uL (ref 4.0–10.5)

## 2016-12-22 LAB — MAGNESIUM: Magnesium: 2 mg/dL (ref 1.7–2.4)

## 2016-12-22 LAB — PROTIME-INR
INR: 0.98
PROTHROMBIN TIME: 12.9 s (ref 11.4–15.2)

## 2016-12-22 LAB — I-STAT TROPONIN, ED: TROPONIN I, POC: 0.01 ng/mL (ref 0.00–0.08)

## 2016-12-22 LAB — I-STAT CG4 LACTIC ACID, ED: LACTIC ACID, VENOUS: 1.93 mmol/L — AB (ref 0.5–1.9)

## 2016-12-22 LAB — CBG MONITORING, ED: Glucose-Capillary: 73 mg/dL (ref 65–99)

## 2016-12-22 MED ORDER — DIPHENHYDRAMINE HCL 50 MG/ML IJ SOLN
25.0000 mg | Freq: Once | INTRAMUSCULAR | Status: AC
  Administered 2016-12-22: 25 mg via INTRAVENOUS
  Filled 2016-12-22: qty 1

## 2016-12-22 MED ORDER — KETOROLAC TROMETHAMINE 30 MG/ML IJ SOLN
30.0000 mg | Freq: Once | INTRAMUSCULAR | Status: AC
  Administered 2016-12-22: 30 mg via INTRAVENOUS
  Filled 2016-12-22: qty 1

## 2016-12-22 MED ORDER — IOPAMIDOL (ISOVUE-370) INJECTION 76%
INTRAVENOUS | Status: AC
  Filled 2016-12-22: qty 100

## 2016-12-22 MED ORDER — METOCLOPRAMIDE HCL 5 MG/ML IJ SOLN
10.0000 mg | Freq: Once | INTRAMUSCULAR | Status: AC
  Administered 2016-12-22: 10 mg via INTRAVENOUS
  Filled 2016-12-22: qty 2

## 2016-12-22 MED ORDER — SODIUM CHLORIDE 0.9 % IV BOLUS (SEPSIS)
1000.0000 mL | Freq: Once | INTRAVENOUS | Status: AC
  Administered 2016-12-22: 1000 mL via INTRAVENOUS

## 2016-12-22 NOTE — ED Notes (Signed)
EDP at bedside  

## 2016-12-22 NOTE — H&P (Signed)
Ashlee Williams ZOX:[id number] DOB: [birth year] DOA: 12/22/2016     PCP: Sheliah Hatch, MD   Outpatient Specialists: [facility]  Patient coming from:  home Lives   With family   Chief Complaint: syncope  HPI: Ashlee Williams is a 61 y.o. female with medical history significant of AAA, DM2, OSA on CPAP, HTN, HLD, morbid obesity, Tobacco abuse     Presented with syncopal when patient went to the kitchen she reports feeling dizzy and then passed out family found her shortly thereafter sitting up propped against a wall. She did not hit her head patient per family initially felt to be apneic they gave her rescue breath patient woke up and started coughingl patient had another episode of syncopal again shortly after. Denies any associated chest pain  no palpitations no leg swelling no recent fevers or chills. He has been diagnosed with bronchitis recently been having some cough  And slight shortness of breath and occasional wheezing she still smokes. for past few weeks she was treated with antibioticsand prednisone. She has finished a course of steroids states initially she felt better but then got worse again, Some  relief from Albuterol. No sick contacts.  No known hx of COPD. Reports decreased PO intake and feeling lightheaded for the past 3 weeks.  Patient have had some associated headaches states that years back she was diagnosed for neurology to her migraine headaches. No associated fever or stiff neck denies photophobia  EMS was called on arriva  head Williams, Left side numbness,   was orthostatic in ER Regarding pertinent Chronic problems: hx of OSA on CPAP. States she have had syncope in the past and have seen by cardiology  Last Echo 2012 EF 50-55%  IN ER:  No data recorded.      on arrival  ED Triage Vitals  Enc Vitals Group     BP 12/22/16 1600 (!) 159/91     Pulse Rate 12/22/16 1600 68     Resp 12/22/16 1600 (!) 23     Temp --      Temp src --      SpO2  12/22/16 1600 98 %     Weight --      Height --      Head Circumference --      Peak Flow --      Pain Score 12/22/16 1557 10     Pain Loc --      Pain Edu? --      Excl. in GC? --     Latest RR 21 98% HR 67 BP 131/87   Lactic acid 1.93 Trop 0.01 Na 140 K 4.0 Cr 0.9 WBC 6.8 Hg 12.2 plt 205 Mg 2.0  CTA non acute no aortic dissection  CT head non acute  Following Medications were ordered in ER: Medications  iopamidol (ISOVUE-370) 76 % injection (not administered)  diphenhydrAMINE (BENADRYL) injection 25 mg (25 mg Intravenous Given 12/22/16 1810)  ketorolac (TORADOL) 30 MG/ML injection 30 mg (30 mg Intravenous Given 12/22/16 1810)  metoCLOPramide (REGLAN) injection 10 mg (10 mg Intravenous Given 12/22/16 1810)  sodium chloride 0.9 % bolus 1,000 mL (1,000 mLs Intravenous New Bag/Given 12/22/16 1943)     Hospitalist was called for admission for Syncope   Review of Systems:    Pertinent positives include: fatigue, non-productive cough,  Constitutional:  No weight loss, night sweats, Fevers, chills,  weight loss  HEENT:  No headaches, Difficulty swallowing,Tooth/dental problems,Sore throat,  NoJESSALYN Williams,  Ashlee Williams, Ashlee Williams, Ashlee Williams, Ashlee Williams,  Cardio-vascular:  No chest pain, Orthopnea, PND, anasarca, dizziness, palpitations.no Bilateral lower extremity swelling  GI:  No heartburn, indigestion, abdominal pain, nausea, vomiting, diarrhea, change in bowel habits, loss of appetite, melena, blood in stool, hematemesis Resp:  no shortness of breath at rest. No dyspnea on exertion, No excess mucus, no productive cough, No  No coughing up of blood.No change in color of mucus.No wheezing. Skin:  no rash or lesions. No jaundice GU:  no dysuria, change in color of urine, no urgency or frequency. No straining to urinate.  No flank pain.  Musculoskeletal:  No joint pain or no joint swelling. No decreased range of motion. No back pain.  Psych:  No change in mood or  affect. No depression or anxiety. No memory loss.  Neuro: no localizing neurological complaints, no tingling, no weakness, no double vision, no gait abnormality, no slurred speech, no confusion  As per HPI otherwise 10 point review of systems negative.   Past Medical History: Past Medical History:  Diagnosis Date  . AAA (abdominal aortic aneurysm) (HCC)   . Allergy   . Anemia   . Asthma   . Blood in stool   . Bronchitis   . Chicken pox   . Chronic headaches   . Diabetes mellitus    PT. IS TRYING TO CONTROL WITH DIET BUT AS NOTED WEIGHT IS UP  . Hypertension   . Obesity   . Tobacco abuse    Past Surgical History:  Procedure Laterality Date  . ABDOMINAL HYSTERECTOMY  1998  . CHOLECYSTECTOMY N/A 03/16/2015   Procedure: LAPAROSCOPIC CHOLECYSTECTOMY;  Surgeon: Jimmye Norman, MD;  Location: [facility] OR;  Service: General;  Laterality: N/A;     Social History:  Ambulatory  independently    reports that she has been smoking Cigarettes.  She has a 5.00 pack-year smoking history. She has never used smokeless tobacco. She reports that she drinks alcohol. She reports that she does not use drugs.  Allergies:  No Known Allergies     Family History:   Family History  Problem Relation Age of Onset  . Hypertension Mother   . Stroke Mother   . Hypertension Father   . Diabetes Sister   . Hypertension Sister   . Cancer Maternal Aunt   . Hypertension Maternal Aunt   . Stroke Maternal Grandmother   . Hypertension Maternal Grandmother   . Colon cancer Maternal Grandmother 80  . Cancer Maternal Grandfather   . Hypertension Maternal Grandfather   . Hypertension Brother   . Hypertension Maternal Uncle   . Hypertension Paternal Aunt   . Hypertension Paternal Uncle   . Hypertension Paternal Grandmother   . Hypertension Paternal Grandfather     Medications: Prior to Admission medications   Medication Sig Start Date End Date Taking? Authorizing Provider  amLODipine (NORVASC) 2.5 MG tablet  TAKE 1 TABLET BY MOUTH ONCE DAILY 09/20/16   Sheliah Hatch, MD  amoxicillin (AMOXIL) 875 MG tablet Take 1 tablet (875 mg total) by mouth 2 (two) times daily. 12/03/16   Sheliah Hatch, MD  APAP-Isometheptene-Dichloral (MIDRIN) [phone] MG CAPS Take 1 capsule by mouth every 6 (six) hours as needed.     [provider]  atorvastatin (LIPITOR) 10 MG tablet TAKE 1 TABLET BY MOUTH DAILY. 07/18/16   Sheliah Hatch, MD  Azelastine-Fluticasone [facility]) 137-50 MCG/ACT SUSP Place 2 sprays into both nostrils at bedtime. 12/18/16   Waymon Budge, MD  benzonatate (TESSALON) 100 MG capsule Take 1 capsule (100 mg total) by mouth 2 (two) times daily as needed for cough. 11/28/16   Waldon Merl, PA-C  butalbital-acetaminophen-caffeine [facility], [facility]GIC) [id number] MG tablet Take 1-2 tablets by mouth every 6 (six) hours as needed for headache. 11/29/16 11/29/17  Deatra Canter, FNP  calcium-vitamin D (CALCIUM 500/D) 500-200 MG-UNIT per tablet Take 1 tablet by mouth.    [provider]  cetirizine (ZYRTEC) 10 MG tablet Take 10 mg by mouth daily.    [provider]  ipratropium (ATROVENT) 0.06 % Ashlee spray Place 2 sprays into both nostrils 4 (four) times daily. 11/29/16   Deatra Canter, FNP  Multiple Vitamins-Minerals (MULTIVITAMIN WITH MINERALS) tablet Take 1 tablet by mouth daily.    [provider]  ondansetron (ZOFRAN ODT) 4 MG disintegrating tablet Take 1 tablet (4 mg total) by mouth every 8 (eight) hours as needed for nausea or vomiting. 11/16/14   Teressa Lower, NP  valsartan-hydrochlorothiazide (DIOVAN-HCT) 320-12.5 MG tablet TAKE 1 TABLET BY MOUTH DAILY. 07/18/16   Sheliah Hatch, MD  VENTOLIN HFA 108 (90 Base) MCG/ACT inhaler INHALE 2 PUFFS INTO THE LUNGS EVERY 4 HOURS AS NEEDED FOR WHEEZING OR SHORTNESS OF BREATH. 02/07/16   Sheliah Hatch, MD  zolpidem (AMBIEN) 5 MG tablet Take 1 tablet (5 mg total) by mouth at bedtime as needed for sleep.  11/01/16   Jetty Duhamel D, MD    Physical Exam: Patient Vitals for the past 24 hrs:  BP Pulse Resp SpO2  12/22/16 2115 131/87 67 (!) 21 98 %  12/22/16 2045 134/85 63 17 100 %  12/22/16 2000 (!) 142/93 (!) 56 16 98 %  12/22/16 1930 (!) 150/95 (!) 56 20 96 %  12/22/16 1920 132/88 81 18 100 %  12/22/16 1900 (!) 141/98 (!) 56 - 98 %  12/22/16 1830 136/82 69 - 97 %  12/22/16 1745 (!) 158/99 81 - 99 %  12/22/16 1730 (!) 159/97 (!) 56 - 97 %  12/22/16 1700 (!) 156/99 73 - 100 %  12/22/16 1645 (!) 162/96 68 13 98 %  12/22/16 1630 (!) 146/93 (!) 59 18 98 %  12/22/16 1615 (!) 156/99 (!) 57 11 98 %  12/22/16 1600 (!) 159/91 68 (!) 23 98 %    1. General:  in No Acute distress   Chronically ill  -appearing 2. Psychological: Alert and  Oriented 3. Head/ENT:     Dry Mucous Membranes                          Head Non traumatic, neck supple                            Poor Dentition 4. SKIN:  decreased Skin turgor,  Skin clean Dry and intact no rash 5. Heart: Regular rate and rhythm systolic  Murmur, no Rub or gallop 6. Lungs:  no wheezes or crackles   7. Abdomen: Soft, non-tender, Non distended obese  bowel sounds present 8. Lower extremities: no clubbing, cyanosis, or edema 9. Neurologically Grossly intact, moving all 4 extremities equally   10. MSK: Normal range of motion   body mass index is unknown because there is no height or weight on file.  Labs on Admission:   Labs on Admission: I have personally reviewed following labs and imaging studies  CBC:  Recent Labs Lab 12/22/16 1602  WBC  6.8  HGB 12.1  HCT 38.6  MCV 82.8  PLT 205   Basic Metabolic Panel:  Recent Labs Lab 12/22/16 1602  NA 140  K 4.0  CL 106  CO2 27  GLUCOSE 84  BUN 10  CREATININE 0.90  CALCIUM 8.9  MG 2.0   GFR: Estimated Creatinine Clearance: 84.4 mL/min (by C-G formula based on SCr of 0.9 mg/dL). Liver Function Tests: No results for input(s): AST, ALT, ALKPHOS, BILITOT, PROT, ALBUMIN in the  last 168 hours. No results for input(s): LIPASE, AMYLASE in the last 168 hours. No results for input(s): AMMONIA in the last 168 hours. Coagulation Profile:  Recent Labs Lab 12/22/16 1602  INR 0.98   Cardiac Enzymes: No results for input(s): CKTOTAL, CKMB, CKMBINDEX, TROPONINI in the last 168 hours. BNP (last 3 results) No results for input(s): PROBNP in the last 8760 hours. HbA1C: No results for input(s): HGBA1C in the last 72 hours. CBG:  Recent Labs Lab 12/22/16 1613  GLUCAP 73   Lipid Profile: No results for input(s): CHOL, HDL, LDLCALC, TRIG, CHOLHDL, LDLDIRECT in the last 72 hours. Thyroid Function Tests: No results for input(s): TSH, T4TOTAL, FREET4, T3FREE, THYROIDAB in the last 72 hours. Anemia Panel: No results for input(s): VITAMINB12, FOLATE, FERRITIN, TIBC, IRON, RETICCTPCT in the last 72 hours. Urine analysis:    Component Value Date/Time   COLORURINE YELLOW 12/22/2016 1810   APPEARANCEUR CLEAR 12/22/2016 1810   LABSPEC 1.039 (H) 12/22/2016 1810   PHURINE 6.0 12/22/2016 1810   GLUCOSEU NEGATIVE 12/22/2016 1810   GLUCOSEU NEGATIVE 05/09/2007 1236   HGBUR NEGATIVE 12/22/2016 1810   BILIRUBINUR NEGATIVE 12/22/2016 1810   BILIRUBINUR neg 02/05/2011 1445   KETONESUR NEGATIVE 12/22/2016 1810   PROTEINUR NEGATIVE 12/22/2016 1810   UROBILINOGEN 0.2 11/16/2014 1120   NITRITE NEGATIVE 12/22/2016 1810   LEUKOCYTESUR NEGATIVE 12/22/2016 1810   Sepsis Labs: (procalcitonin:4,lacticidven:4) )No results found for this or any previous visit (from the past 240 hour(s)).     UA   no evidence of UTI   Lab Results  Component Value Date   HGBA1C 6.4 10/16/2016    Estimated Creatinine Clearance: 84.4 mL/min (by C-G formula based on SCr of 0.9 mg/dL).  BNP (last 3 results) No results for input(s): PROBNP in the last 8760 hours.   ECG REPORT  Independently reviewed Rate: 68  Rhythm: NSR ST&T Change: No acute ischemic changes    QTC 488   There  were no vitals filed for this visit.   Cultures: No results found for: SDES, SPECREQUEST, CULT, REPTSTATUS   Radiological Exams on Admission: Ct Head Wo Contrast  Result Date: 12/22/2016 CLINICAL DATA:  Left-sided headache. Right facial weakness for 3 weeks. EXAM: CT HEAD WITHOUT CONTRAST TECHNIQUE: Contiguous axial images were obtained from the base of the skull through the vertex without intravenous contrast. COMPARISON:  October 21, 2008 FINDINGS: Brain: No evidence of acute infarction, hemorrhage, hydrocephalus, extra-axial collection or mass lesion/mass effect. Vascular: No hyperdense vessel or unexpected calcification. Skull: Normal. Negative for fracture or focal lesion. Sinuses/Orbits: No acute finding. Other: None. IMPRESSION: No cause for the patient's symptoms identified. Electronically Signed   By: Gerome Sam III M.D   On: 12/22/2016 17:42   Ct Angio Chest/abd/pel For Dissection W And/or W/wo  Result Date: 12/22/2016 CLINICAL DATA:  Syncope.  Unexplained loss of consciousness. EXAM: CT ANGIOGRAPHY CHEST, ABDOMEN AND PELVIS TECHNIQUE: Multidetector CT imaging through the chest, abdomen and pelvis was performed using the standard protocol during bolus administration of intravenous  contrast. Multiplanar reconstructed images and MIPs were obtained and reviewed to evaluate the vascular anatomy. CONTRAST:  100 cc of Isovue 370 COMPARISON:  None. FINDINGS: CTA CHEST FINDINGS Cardiovascular: Preferential opacification of the thoracic aorta. No evidence of thoracic aortic aneurysm or dissection. Normal heart size. No pericardial effusion.Normal heart size. No pericardial effusion. Mediastinum/Nodes: No enlarged mediastinal, hilar, or axillary lymph nodes. Thyroid gland, trachea, and esophagus demonstrate no significant findings. Lungs/Pleura: No pleural effusion. No airspace consolidation identified. Calcified granuloma identified in the left lower lobe. Mild mosaic attenuation pattern is  identified within the lungs. Right middle lobe pulmonary nodule measures 4 mm. Musculoskeletal: Degenerative disc disease identified within the lower thoracic spine. Review of the MIP images confirms the above findings. CTA ABDOMEN AND PELVIS FINDINGS VASCULAR Aorta: Normal caliber aorta without aneurysm, dissection, vasculitis or significant stenosis. Celiac: Patent without evidence of aneurysm, dissection, vasculitis or significant stenosis. SMA: Patent without evidence of aneurysm, dissection, vasculitis or significant stenosis. Renals: Both renal arteries are patent without evidence of aneurysm, dissection, vasculitis, fibromuscular dysplasia or significant stenosis. IMA: Patent without evidence of aneurysm, dissection, vasculitis or significant stenosis. Inflow: Patent without evidence of aneurysm, dissection, vasculitis or significant stenosis. Veins: No obvious venous abnormality within the limitations of this arterial phase study. Review of the MIP images confirms the above findings. NON-VASCULAR Hepatobiliary: No focal liver abnormality is seen. Status Ashlee cholecystectomy. No biliary dilatation. Pancreas: Unremarkable. No pancreatic ductal dilatation or surrounding inflammatory changes. Spleen: Normal in size without focal abnormality. Adrenals/Urinary Tract: Adrenal glands are unremarkable. Kidneys are normal, without renal calculi, focal lesion, or hydronephrosis. Bladder is unremarkable. Stomach/Bowel: Stomach is within normal limits. Appendix not confidently identified. No evidence of bowel wall thickening, distention, or inflammatory changes. Lymphatic: No enlarged abdominal or pelvic lymph nodes. Reproductive: Status Ashlee hysterectomy. No adnexal masses. Other: No abdominal wall hernia or abnormality. No abdominopelvic ascites. Musculoskeletal: No acute or significant osseous findings. Review of the MIP images confirms the above findings. IMPRESSION: 1. No acute vascular abnormality identified. No  evidence for aortic dissection or aneurysm. 2. Mild mosaic attenuation pattern identified within the lungs. This is a nonspecific finding but may be seen with small airways disease. 3. Right middle lobe pulmonary nodule is identified measuring 4 mm. No follow-up needed if patient is low-risk. Non-contrast chest CT can be considered in 12 months if patient is high-risk. This recommendation follows the consensus statement: Guidelines for Management of Incidental Pulmonary Nodules Detected on CT Images: From the Fleischner Society 2017; Radiology 2017; 284:228-243. Electronically Signed   By: Signa Kell M.D.   On: 12/22/2016 18:03    Chart has been reviewed    Assessment/Plan   61 y.o. female with medical history significant of AAA, DM2, OSA, HTN, HLD, morbid obesityAdmitted for syncope  Present on Admission: . Syncope -  given risk factor will admit rehydrate obtain CE, monitor on tele and obtain carotid dopplers  . Essential hypertension - given somewhat soft blood pressures tonight we'll hold home medications restart when able to tolerate  . Hyperlipidemia associated with type 2 diabetes mellitus (HCC)stable continue home medication . Obstructive sleep apnea - order CPAP . Tobacco user -  - Spoke about importance of quitting,   - order nicotine patch   - nursing tobacco cessation protocol  . Bronchitis vs COPD exacerbation - mild will treat with prednisone and doxycycline albuterol when necessary and scheduled Atrovent  DM 2-  - Order Sensitive SSI     -  check TSH and HgA1C  Headache - currently resolved after administration of migraine cocktailneurologically intact   Other plan as per orders.  DVT prophylaxis:   Lovenox     Code Status:  FULL CODE   as per patient    Family Communication:   Family   at  Bedside  plan of care was discussed with   Daughter  Disposition Plan:       To home once workup is complete and patient is stable                        Would benefit  from PT/OT eval prior to DC   ordered                      consulted                          Consults called: none   Admission status:   obs   Level of care    tele         I have spent a total of 56 min on this admission   Rishabh Rinkenberger 12/23/2016, 12:28 AM    [facility]  Pager [phone]   after 2 AM please page floor coverage PA If 7AM-7PM, please contact the day team taking care of the patient  [email or web address]  Password [email or web address]

## 2016-12-22 NOTE — ED Provider Notes (Signed)
[facility]-EMERGENCY DEPT Provider Note   CSN: [id number] Arrival date & time: 12/22/16  1542     History   Chief Complaint Chief Complaint  Patient presents with  . Loss of Consciousness    5 min    HPI Ashlee Williams is a 61 y.o. female with a history of HTN, AAA (last measured as 3.0cm in 2016), HLD, DM2, anemia, chronic headaches, obesity and tobacco abuse who presents to the ED today for LOC that occurred ~1 hour ago. Patient sister states that the patient made herself a cup of tea, walked to her room and when the door was closed they heard a loud thud. They found the patient in the room, with her buttocks on the floor and back against the door unconscious. They called EMS who advised to check if the patient was breathing. They were unsure so they gave to rescue breaths which cause the patient to cough and awake. She then had another brief syncopal episode. This process lasted for ~5 minutes. The patient notes the last thing she remembers is getting up to make tea. She denies chest pain, sob, abdominal pain, or lightheadedness before or after the event. No N/V after. Notes current HA that has been ongoing as described below. Denies focal weakness, CP, SOB, palpitations, melena, diaphoresis, changes in hearing, unilateral neck pain, unilateral weakness, facial asymmetry, difficulty with speech, change in gait, N/V, alcohol/drug use.   Patient states over the last 3 weeks she has had a productive cough with clear/yellow sputum. She was seen by PCP and given abx, prednisone and cough medicine. This has continued over the last weeks with productive cough with associated sob. No chest pain or leg swelling. She also admits to three week history of HA, located right frontal area, throbbing in nature. She states this is occasionally associated with numbness on the left side of her face. This is not constant. She was given medication for this when she was seen at the [facility] for HA on 11/29/16 and said this  provides occassional relief. She states she was diagnosed with migraines by a neurologist at [facility] years ago but is not sure of the doctors name. No documentation of this. Denies fever,  photophobia, phonophobia, visual changes, stiff neck, neck pain, rash, seizure, jaw claudication, or "thunderclap" onset. Not first HA. Not worst HA of life.   HPI  Past Medical History:  Diagnosis Date  . AAA (abdominal aortic aneurysm) (HCC)   . Allergy   . Anemia   . Asthma   . Blood in stool   . Bronchitis   . Chicken pox   . Chronic headaches   . Diabetes mellitus    PT. IS TRYING TO CONTROL WITH DIET BUT AS NOTED WEIGHT IS UP  . Hypertension   . Obesity   . Tobacco abuse     Patient Active Problem List   Diagnosis Date Noted  . Obstructive sleep apnea 06/11/2016  . Mixed rhinitis 06/11/2016  . Symptomatic cholelithiasis 03/16/2015  . Hyperlipidemia associated with type 2 diabetes mellitus (HCC) 01/19/2015  . Gallstones 11/18/2014  . AAA (abdominal aortic aneurysm) (HCC)   . Laryngitis 08/24/2014  . Vitamin D deficiency 06/07/2014  . Sinusitis 04/25/2011  . Physical exam 02/14/2011  . Hand pain 02/05/2011  . INFLUENZA WITH OTHER RESPIRATORY MANIFESTATIONS 12/23/2007  . HEADACHE 05/09/2007  . Diet-controlled diabetes mellitus (HCC) 04/04/2007  . MORBID OBESITY 04/04/2007  . Tobacco user 04/04/2007  . Essential hypertension 04/04/2007  . ASTHMATIC BRONCHITIS, ACUTE  04/04/2007    Past Surgical History:  Procedure Laterality Date  . ABDOMINAL HYSTERECTOMY  1998  . CHOLECYSTECTOMY N/A 03/16/2015   Procedure: LAPAROSCOPIC CHOLECYSTECTOMY;  Surgeon: Jimmye Norman, MD;  Location: [facility] OR;  Service: General;  Laterality: N/A;    OB History    No data available       Home Medications    Prior to Admission medications   Medication Sig Start Date End Date Taking? Authorizing Provider  amLODipine (NORVASC) 2.5 MG tablet TAKE 1 TABLET BY MOUTH ONCE DAILY 09/20/16   Sheliah Hatch, MD  amoxicillin (AMOXIL) 875 MG tablet Take 1 tablet (875 mg total) by mouth 2 (two) times daily. 12/03/16   Sheliah Hatch, MD  APAP-Isometheptene-Dichloral (MIDRIN) [phone] MG CAPS Take 1 capsule by mouth every 6 (six) hours as needed.     [provider]  atorvastatin (LIPITOR) 10 MG tablet TAKE 1 TABLET BY MOUTH DAILY. 07/18/16   Sheliah Hatch, MD  Azelastine-Fluticasone [facility]) 137-50 MCG/ACT SUSP Place 2 sprays into both nostrils at bedtime. 12/18/16   Waymon Budge, MD  benzonatate (TESSALON) 100 MG capsule Take 1 capsule (100 mg total) by mouth 2 (two) times daily as needed for cough. 11/28/16   Waldon Merl, PA-C  butalbital-acetaminophen-caffeine [place], ESGIC) [phone] MG tablet Take 1-2 tablets by mouth every 6 (six) hours as needed for headache. 11/29/16 11/29/17  Deatra Canter, FNP  calcium-vitamin D (CALCIUM 500/D) 500-200 MG-UNIT per tablet Take 1 tablet by mouth.    [provider]  cetirizine (ZYRTEC) 10 MG tablet Take 10 mg by mouth daily.    [provider]  ipratropium (ATROVENT) 0.06 % nasal spray Place 2 sprays into both nostrils 4 (four) times daily. 11/29/16   Deatra Canter, FNP  Multiple Vitamins-Minerals (MULTIVITAMIN WITH MINERALS) tablet Take 1 tablet by mouth daily.    [provider]  ondansetron (ZOFRAN ODT) 4 MG disintegrating tablet Take 1 tablet (4 mg total) by mouth every 8 (eight) hours as needed for nausea or vomiting. 11/16/14   Teressa Lower, NP  valsartan-hydrochlorothiazide (DIOVAN-HCT) 320-12.5 MG tablet TAKE 1 TABLET BY MOUTH DAILY. 07/18/16   Sheliah Hatch, MD  VENTOLIN HFA 108 (90 Base) MCG/ACT inhaler INHALE 2 PUFFS INTO THE LUNGS EVERY 4 HOURS AS NEEDED FOR WHEEZING OR SHORTNESS OF BREATH. 02/07/16   Sheliah Hatch, MD  zolpidem (AMBIEN) 5 MG tablet Take 1 tablet (5 mg total) by mouth at bedtime as needed for sleep. 11/01/16   Waymon Budge, MD    Family  History Family History  Problem Relation Age of Onset  . Hypertension Mother   . Stroke Mother   . Hypertension Father   . Diabetes Sister   . Hypertension Sister   . Cancer Maternal Aunt   . Hypertension Maternal Aunt   . Stroke Maternal Grandmother   . Hypertension Maternal Grandmother   . Colon cancer Maternal Grandmother 25  . Cancer Maternal Grandfather   . Hypertension Maternal Grandfather   . Hypertension Brother   . Hypertension Maternal Uncle   . Hypertension Paternal Aunt   . Hypertension Paternal Uncle   . Hypertension Paternal Grandmother   . Hypertension Paternal Grandfather     Social History Social History  Substance Use Topics  . Smoking status: Current Every Day Smoker    Packs/day: 0.50    Years: 10.00    Types: Cigarettes    Last attempt to quit: 07/09/1999  . Smokeless tobacco: Never  Used  . Alcohol use 0.0 oz/week     Allergies   Patient has no known allergies.   Review of Systems Review of Systems  All other systems reviewed and are negative.    Physical Exam Updated Vital Signs BP 131/87   Pulse 67   Resp (!) 21   LMP 11/10/2010   SpO2 98%   Physical Exam  Constitutional: She appears well-developed and well-nourished.  HENT:  Head: Normocephalic and atraumatic.  Right Ear: External ear normal.  Left Ear: External ear normal.  Nose: Nose normal.  Mouth/Throat: Uvula is midline, oropharynx is clear and moist and mucous membranes are normal. No oropharyngeal exudate. No tonsillar exudate.  Eyes: Pupils are equal, round, and reactive to light. Conjunctivae and EOM are normal. Right eye exhibits no discharge. Left eye exhibits no discharge. No scleral icterus.  Neck: Trachea normal and normal range of motion. Neck supple. No spinous process tenderness present. No neck rigidity. Normal range of motion present.  Cardiovascular: Normal rate, regular rhythm and intact distal pulses.   Murmur heard.  Systolic murmur is present  Pulses:       Radial pulses are 2+ on the right side, and 2+ on the left side.       Dorsalis pedis pulses are 2+ on the right side, and 2+ on the left side.       Posterior tibial pulses are 2+ on the right side, and 2+ on the left side.  No lower extremity swelling or edema. Calves symmetric in size bilaterally.  Pulmonary/Chest: Effort normal and breath sounds normal. She exhibits no tenderness.  Abdominal: Soft. Bowel sounds are normal. She exhibits no pulsatile midline mass. There is no tenderness. There is no rigidity, no rebound, no guarding and no CVA tenderness.  Musculoskeletal: She exhibits no edema.  Lymphadenopathy:    She has no cervical adenopathy.  Neurological: She is alert.  Speech clear. Follows commands. No facial droop. PERRLA. EOMI. Normal peripheral fields.  Cranial Nerves:  II:  Pupils equal, round, reactive to light III,IV, VI: ptosis not present, extra-ocular motions intact bilaterally  V,VII: smile symmetric. Reports changes in sensation of the right upper face and left lower face to light touch.  VIII: hearing grossly normal bilaterally  IX,X: midline uvula rise  XI: bilateral shoulder shrug equal and strong XII: midline tongue extension Grossly moves all extremities 4 without ataxia. Coordination intact. Able and appropriate strength for age to upper and lower extremities bilaterally including grip strength. Sensation to light touch intact bilaterally for upper and lower. Patellar deep tendon reflex 2+ and equal bilaterally. Normal finger to nose and rapid alternating movements. Normal heel to shin balance. Romberg with significant swaying back and forth, catching herself with her hand on bed occassionally. No pronator drift. Gait able.   Skin: Skin is warm and dry. No rash noted. She is not diaphoretic.  Psychiatric: She has a normal mood and affect.  Nursing note and vitals reviewed.    ED Treatments / Results  Labs (all labs ordered are listed, but only abnormal  results are displayed) Labs Reviewed  CBC - Abnormal; Notable for the following:       Result Value   RDW 15.7 (*)    All other components within normal limits  URINALYSIS, ROUTINE W REFLEX MICROSCOPIC - Abnormal; Notable for the following:    Specific Gravity, Urine 1.039 (*)    All other components within normal limits  I-STAT CG4 LACTIC ACID, ED - Abnormal; Notable for  the following:    Lactic Acid, Venous 1.93 (*)    All other components within normal limits  BASIC METABOLIC PANEL  MAGNESIUM  PROTIME-INR  CBG MONITORING, ED  I-STAT TROPONIN, ED    EKG  EKG Interpretation  Date/Time:  Saturday December 22 2016 15:54:09 EDT Ventricular Rate:  64 PR Interval:    QRS Duration: 89 QT Interval:  457 QTC Calculation: 472 R Axis:   41 Text Interpretation:  Sinus rhythm No significant change since last tracing Confirmed by Shaune Pollack [phone]) on 12/22/2016 4:29:02 PM       Radiology Ct Head Wo Contrast  Result Date: 12/22/2016 CLINICAL DATA:  Left-sided headache. Right facial weakness for 3 weeks. EXAM: CT HEAD WITHOUT CONTRAST TECHNIQUE: Contiguous axial images were obtained from the base of the skull through the vertex without intravenous contrast. COMPARISON:  October 21, 2008 FINDINGS: Brain: No evidence of acute infarction, hemorrhage, hydrocephalus, extra-axial collection or mass lesion/mass effect. Vascular: No hyperdense vessel or unexpected calcification. Skull: Normal. Negative for fracture or focal lesion. Sinuses/Orbits: No acute finding. Other: None. IMPRESSION: No cause for the patient's symptoms identified. Electronically Signed   By: Gerome Sam III M.D   On: 12/22/2016 17:42   Ct Angio Chest/abd/pel For Dissection W And/or W/wo  Result Date: 12/22/2016 CLINICAL DATA:  Syncope.  Unexplained loss of consciousness. EXAM: CT ANGIOGRAPHY CHEST, ABDOMEN AND PELVIS TECHNIQUE: Multidetector CT imaging through the chest, abdomen and pelvis was performed using the  standard protocol during bolus administration of intravenous contrast. Multiplanar reconstructed images and MIPs were obtained and reviewed to evaluate the vascular anatomy. CONTRAST:  100 cc of Isovue 370 COMPARISON:  None. FINDINGS: CTA CHEST FINDINGS Cardiovascular: Preferential opacification of the thoracic aorta. No evidence of thoracic aortic aneurysm or dissection. Normal heart size. No pericardial effusion.Normal heart size. No pericardial effusion. Mediastinum/Nodes: No enlarged mediastinal, hilar, or axillary lymph nodes. Thyroid gland, trachea, and esophagus demonstrate no significant findings. Lungs/Pleura: No pleural effusion. No airspace consolidation identified. Calcified granuloma identified in the left lower lobe. Mild mosaic attenuation pattern is identified within the lungs. Right middle lobe pulmonary nodule measures 4 mm. Musculoskeletal: Degenerative disc disease identified within the lower thoracic spine. Review of the MIP images confirms the above findings. CTA ABDOMEN AND PELVIS FINDINGS VASCULAR Aorta: Normal caliber aorta without aneurysm, dissection, vasculitis or significant stenosis. Celiac: Patent without evidence of aneurysm, dissection, vasculitis or significant stenosis. SMA: Patent without evidence of aneurysm, dissection, vasculitis or significant stenosis. Renals: Both renal arteries are patent without evidence of aneurysm, dissection, vasculitis, fibromuscular dysplasia or significant stenosis. IMA: Patent without evidence of aneurysm, dissection, vasculitis or significant stenosis. Inflow: Patent without evidence of aneurysm, dissection, vasculitis or significant stenosis. Veins: No obvious venous abnormality within the limitations of this arterial phase study. Review of the MIP images confirms the above findings. NON-VASCULAR Hepatobiliary: No focal liver abnormality is seen. Status post cholecystectomy. No biliary dilatation. Pancreas: Unremarkable. No pancreatic ductal  dilatation or surrounding inflammatory changes. Spleen: Normal in size without focal abnormality. Adrenals/Urinary Tract: Adrenal glands are unremarkable. Kidneys are normal, without renal calculi, focal lesion, or hydronephrosis. Bladder is unremarkable. Stomach/Bowel: Stomach is within normal limits. Appendix not confidently identified. No evidence of bowel wall thickening, distention, or inflammatory changes. Lymphatic: No enlarged abdominal or pelvic lymph nodes. Reproductive: Status post hysterectomy. No adnexal masses. Other: No abdominal wall hernia or abnormality. No abdominopelvic ascites. Musculoskeletal: No acute or significant osseous findings. Review of the MIP images confirms the above findings. IMPRESSION: 1. No  acute vascular abnormality identified. No evidence for aortic dissection or aneurysm. 2. Mild mosaic attenuation pattern identified within the lungs. This is a nonspecific finding but may be seen with small airways disease. 3. Right middle lobe pulmonary nodule is identified measuring 4 mm. No follow-up needed if patient is low-risk. Non-contrast chest CT can be considered in 12 months if patient is high-risk. This recommendation follows the consensus statement: Guidelines for Management of Incidental Pulmonary Nodules Detected on CT Images: From the Fleischner Society 2017; Radiology 2017; 284:228-243. Electronically Signed   By: Signa Kell M.D.   On: 12/22/2016 18:03    Procedures Procedures (including critical care time)  Medications Ordered in ED Medications  iopamidol (ISOVUE-370) 76 % injection (not administered)  diphenhydrAMINE (BENADRYL) injection 25 mg (25 mg Intravenous Given 12/22/16 1810)  ketorolac (TORADOL) 30 MG/ML injection 30 mg (30 mg Intravenous Given 12/22/16 1810)  metoCLOPramide (REGLAN) injection 10 mg (10 mg Intravenous Given 12/22/16 1810)  sodium chloride 0.9 % bolus 1,000 mL (1,000 mLs Intravenous New Bag/Given 12/22/16 1943)     Initial Impression /  Assessment and Plan / ED Course  I have reviewed the triage vital signs and the nursing notes.  Pertinent labs & imaging results that were available during my care of the patient were reviewed by me and considered in my medical decision making (see chart for details).     61 year old female who presents with syncopal episode. Patient reports going from sitting to standing position and having syncopal episode which may be orthostatic but the patient was up for a short time before the syncopal episode and then had another syncopal episode after awakening. She was noted to be down for ~5 minutes. She has noted systolic murmur and has never had a echo done before. Patient does have history of AAA (last known 3.0cm in 2016) but denies CP, abdominal pain prior or after the event. She notes 3 week history of cough for which she was treated for bronchitis with azithromycin for in the past. She also notes intermittent HA that has been ongoing for some time now as described above. On presentation the patient has reassuring vital signs. EKG sinus rhythm with ischemic changes. Neurologic exam is non-specific with reported numbness of face on upper right and lower left face. Will check basic labs and Tn on patient. Will order CT to r/o dissection due to patient history and known AAA. Will order CT head as patient has HA and syncopal episode. CBG to check for hypoglycemia. UA to check for signs of infection. Will give migraine cocktail (reglan, benadryl, and toradol for HA).  Tn 0.01. CBG 73. BMP unremarkable. CBC unremarkable. Mg normal. Pt-INR normal. UA without signs of UTI. CT head without intracranial abn. CTA chest/abd/pelv without dissection or aneurysm. Lung nodule noted with f/u advised as outpatient. Patient did have + orthostatic changes. Fluids given. Patient HA resolved after migraine cocktail. Still notes numbness of face but able to stand without difficulty.   As patient had multiple syncopal episodes  with systolic murmur, patient needs to be admitted for high risk syncope for Echo and telemetry. Will call for admission. Spoke with hospitalist in person who agrees to admit the patient. Patient in agreement with plan. Patient case seen and discussed with Dr. Erma Heritage who is in agreement with plan.   Final Clinical Impressions(s) / ED Diagnoses   Final diagnoses:  Syncope and collapse    New Prescriptions New Prescriptions   No medications on file  Princella Pellegrini 12/22/16 2215    Shaune Pollack, MD 12/23/16 [phone]

## 2016-12-22 NOTE — ED Triage Notes (Signed)
Patient here for evaluation of a syncopal event that happened this afternoon.  Patient was in kitchen and got dizzy and passed out.,  Family found her on the floor.  Denies any head trauma but does have a headache.  States period lasted 5 min.  Patient is lethargic but A&Ox4 at this time.  Med hx of aortic aneurysm dx 3 years ago, and hypertension.

## 2016-12-23 ENCOUNTER — Encounter (HOSPITAL_COMMUNITY): Payer: Self-pay

## 2016-12-23 ENCOUNTER — Observation Stay (HOSPITAL_BASED_OUTPATIENT_CLINIC_OR_DEPARTMENT_OTHER): Payer: 59

## 2016-12-23 DIAGNOSIS — I10 Essential (primary) hypertension: Secondary | ICD-10-CM | POA: Diagnosis not present

## 2016-12-23 DIAGNOSIS — G4733 Obstructive sleep apnea (adult) (pediatric): Secondary | ICD-10-CM | POA: Diagnosis not present

## 2016-12-23 DIAGNOSIS — I714 Abdominal aortic aneurysm, without rupture: Secondary | ICD-10-CM | POA: Diagnosis not present

## 2016-12-23 DIAGNOSIS — J40 Bronchitis, not specified as acute or chronic: Secondary | ICD-10-CM | POA: Diagnosis present

## 2016-12-23 DIAGNOSIS — E119 Type 2 diabetes mellitus without complications: Secondary | ICD-10-CM | POA: Diagnosis not present

## 2016-12-23 DIAGNOSIS — F17210 Nicotine dependence, cigarettes, uncomplicated: Secondary | ICD-10-CM | POA: Diagnosis not present

## 2016-12-23 DIAGNOSIS — Z720 Tobacco use: Secondary | ICD-10-CM | POA: Diagnosis not present

## 2016-12-23 DIAGNOSIS — I351 Nonrheumatic aortic (valve) insufficiency: Secondary | ICD-10-CM | POA: Diagnosis not present

## 2016-12-23 DIAGNOSIS — R55 Syncope and collapse: Secondary | ICD-10-CM | POA: Diagnosis not present

## 2016-12-23 DIAGNOSIS — E785 Hyperlipidemia, unspecified: Secondary | ICD-10-CM | POA: Diagnosis not present

## 2016-12-23 DIAGNOSIS — Z6841 Body Mass Index (BMI) 40.0 and over, adult: Secondary | ICD-10-CM | POA: Diagnosis not present

## 2016-12-23 LAB — CBG MONITORING, ED: GLUCOSE-CAPILLARY: 83 mg/dL (ref 65–99)

## 2016-12-23 LAB — I-STAT VENOUS BLOOD GAS, ED
ACID-BASE EXCESS: 2 mmol/L (ref 0.0–2.0)
Bicarbonate: 26.7 mmol/L (ref 20.0–28.0)
O2 Saturation: 89 %
PCO2 VEN: 40.6 mmHg — AB (ref 44.0–60.0)
PO2 VEN: 56 mmHg — AB (ref 32.0–45.0)
TCO2: 28 mmol/L (ref 22–32)
pH, Ven: 7.426 (ref 7.250–7.430)

## 2016-12-23 LAB — COMPREHENSIVE METABOLIC PANEL
ALBUMIN: 2.8 g/dL — AB (ref 3.5–5.0)
ALK PHOS: 53 U/L (ref 38–126)
ALT: 16 U/L (ref 14–54)
AST: 14 U/L — ABNORMAL LOW (ref 15–41)
Anion gap: 7 (ref 5–15)
BUN: 10 mg/dL (ref 6–20)
CALCIUM: 8.3 mg/dL — AB (ref 8.9–10.3)
CO2: 24 mmol/L (ref 22–32)
Chloride: 109 mmol/L (ref 101–111)
Creatinine, Ser: 0.88 mg/dL (ref 0.44–1.00)
GFR calc Af Amer: >60 mL/min (ref >60)
GFR calc non Af Amer: >60 mL/min (ref >60)
GLUCOSE: 76 mg/dL (ref 65–99)
Potassium: 3.2 mmol/L — ABNORMAL LOW (ref 3.5–5.1)
SODIUM: 140 mmol/L (ref 135–145)
Total Bilirubin: 0.6 mg/dL (ref 0.3–1.2)
Total Protein: 5.8 g/dL — ABNORMAL LOW (ref 6.5–8.1)

## 2016-12-23 LAB — CBC
HCT: 33.7 % — ABNORMAL LOW (ref 36.0–46.0)
HEMOGLOBIN: 10.6 g/dL — AB (ref 12.0–15.0)
MCH: 26.1 pg (ref 26.0–34.0)
MCHC: 31.5 g/dL (ref 30.0–36.0)
MCV: 83 fL (ref 78.0–100.0)
PLATELETS: 183 10*3/uL (ref 150–400)
RBC: 4.06 MIL/uL (ref 3.87–5.11)
RDW: 16.1 % — ABNORMAL HIGH (ref 11.5–15.5)
WBC: 5.3 10*3/uL (ref 4.0–10.5)

## 2016-12-23 LAB — TSH: TSH: 2.36 u[IU]/mL (ref 0.350–4.500)

## 2016-12-23 LAB — GLUCOSE, CAPILLARY
Glucose-Capillary: 108 mg/dL — ABNORMAL HIGH (ref 65–99)
Glucose-Capillary: 101 mg/dL — ABNORMAL HIGH (ref 65–99)
Glucose-Capillary: 125 mg/dL — ABNORMAL HIGH (ref 65–99)

## 2016-12-23 LAB — ECHOCARDIOGRAM COMPLETE

## 2016-12-23 LAB — MAGNESIUM: Magnesium: 1.9 mg/dL (ref 1.7–2.4)

## 2016-12-23 LAB — TROPONIN I: Troponin I: <0.03 ng/mL (ref <0.03)

## 2016-12-23 LAB — HEMOGLOBIN A1C
HEMOGLOBIN A1C: 6.1 % — AB (ref 4.8–5.6)
Mean Plasma Glucose: 128.37 mg/dL

## 2016-12-23 LAB — PHOSPHORUS: PHOSPHORUS: 3.9 mg/dL (ref 2.5–4.6)

## 2016-12-23 MED ORDER — INSULIN ASPART 100 UNIT/ML ~~LOC~~ SOLN: 0.0000 [IU] | Freq: Every day | SUBCUTANEOUS | Status: DC

## 2016-12-23 MED ORDER — ACETAMINOPHEN 325 MG PO TABS
650.0000 mg | ORAL_TABLET | Freq: Four times a day (QID) | ORAL | Status: DC | PRN
  Administered 2016-12-23 – 2016-12-24 (×2): 650 mg via ORAL
  Filled 2016-12-23 (×2): qty 2

## 2016-12-23 MED ORDER — INSULIN ASPART 100 UNIT/ML ~~LOC~~ SOLN: 0.0000 [IU] | Freq: Three times a day (TID) | SUBCUTANEOUS | Status: DC

## 2016-12-23 MED ORDER — LORATADINE 10 MG PO TABS
10.0000 mg | ORAL_TABLET | Freq: Every day | ORAL | Status: DC
  Administered 2016-12-23 – 2016-12-24 (×2): 10 mg via ORAL
  Filled 2016-12-23 (×2): qty 1

## 2016-12-23 MED ORDER — ZOLPIDEM TARTRATE 5 MG PO TABS: 5.0000 mg | ORAL_TABLET | Freq: Every evening | ORAL | Status: DC | PRN

## 2016-12-23 MED ORDER — SODIUM CHLORIDE 0.9 % IV SOLN
INTRAVENOUS | Status: AC
  Administered 2016-12-23 (×2): via INTRAVENOUS

## 2016-12-23 MED ORDER — ALBUTEROL SULFATE (2.5 MG/3ML) 0.083% IN NEBU: 2.5000 mg | INHALATION_SOLUTION | RESPIRATORY_TRACT | Status: DC | PRN

## 2016-12-23 MED ORDER — HYDROCODONE-ACETAMINOPHEN 5-325 MG PO TABS: 1.0000 | ORAL_TABLET | ORAL | Status: DC | PRN

## 2016-12-23 MED ORDER — PREDNISONE 20 MG PO TABS
40.0000 mg | ORAL_TABLET | Freq: Every day | ORAL | Status: DC
  Administered 2016-12-23: 40 mg via ORAL
  Filled 2016-12-23 (×2): qty 2

## 2016-12-23 MED ORDER — GUAIFENESIN ER 600 MG PO TB12
600.0000 mg | ORAL_TABLET | Freq: Two times a day (BID) | ORAL | Status: DC
  Administered 2016-12-23 – 2016-12-24 (×3): 600 mg via ORAL
  Filled 2016-12-23 (×3): qty 1

## 2016-12-23 MED ORDER — AZELASTINE HCL 0.1 % NA SOLN
2.0000 | Freq: Every day | NASAL | Status: DC
  Administered 2016-12-23: 2 via NASAL
  Filled 2016-12-23: qty 30

## 2016-12-23 MED ORDER — NICOTINE 14 MG/24HR TD PT24
14.0000 mg | MEDICATED_PATCH | Freq: Every day | TRANSDERMAL | Status: DC
  Administered 2016-12-23 – 2016-12-24 (×2): 14 mg via TRANSDERMAL
  Filled 2016-12-23 (×2): qty 1

## 2016-12-23 MED ORDER — IPRATROPIUM BROMIDE 0.06 % NA SOLN
2.0000 | Freq: Three times a day (TID) | NASAL | Status: DC
  Administered 2016-12-23 – 2016-12-24 (×6): 2 via NASAL
  Filled 2016-12-23: qty 15

## 2016-12-23 MED ORDER — ONDANSETRON HCL 4 MG/2ML IJ SOLN: 4.0000 mg | Freq: Four times a day (QID) | INTRAMUSCULAR | Status: DC | PRN

## 2016-12-23 MED ORDER — ONDANSETRON HCL 4 MG PO TABS: 4.0000 mg | ORAL_TABLET | Freq: Four times a day (QID) | ORAL | Status: DC | PRN

## 2016-12-23 MED ORDER — FLUTICASONE PROPIONATE 50 MCG/ACT NA SUSP
2.0000 | Freq: Every day | NASAL | Status: DC
  Administered 2016-12-23: 2 via NASAL
  Filled 2016-12-23: qty 16

## 2016-12-23 MED ORDER — ENOXAPARIN SODIUM 40 MG/0.4ML ~~LOC~~ SOLN
40.0000 mg | Freq: Every day | SUBCUTANEOUS | Status: DC
  Administered 2016-12-23 – 2016-12-24 (×2): 40 mg via SUBCUTANEOUS
  Filled 2016-12-23 (×3): qty 0.4

## 2016-12-23 MED ORDER — SODIUM CHLORIDE 0.9 % IV SOLN
INTRAVENOUS | Status: DC
  Administered 2016-12-23: 07:00:00 via INTRAVENOUS

## 2016-12-23 MED ORDER — AZELASTINE-FLUTICASONE 137-50 MCG/ACT NA SUSP: 2.0000 | Freq: Every day | NASAL | Status: DC

## 2016-12-23 MED ORDER — DOXYCYCLINE HYCLATE 100 MG PO TABS
100.0000 mg | ORAL_TABLET | Freq: Two times a day (BID) | ORAL | Status: DC
  Administered 2016-12-23: 100 mg via ORAL
  Filled 2016-12-23: qty 1

## 2016-12-23 MED ORDER — AMLODIPINE BESYLATE 10 MG PO TABS
10.0000 mg | ORAL_TABLET | Freq: Every day | ORAL | Status: DC
  Administered 2016-12-23 – 2016-12-24 (×2): 10 mg via ORAL
  Filled 2016-12-23 (×2): qty 1

## 2016-12-23 MED ORDER — ACETAMINOPHEN 650 MG RE SUPP: 650.0000 mg | Freq: Four times a day (QID) | RECTAL | Status: DC | PRN

## 2016-12-23 MED ORDER — ATORVASTATIN CALCIUM 10 MG PO TABS
10.0000 mg | ORAL_TABLET | Freq: Every day | ORAL | Status: DC
  Administered 2016-12-23 – 2016-12-24 (×2): 10 mg via ORAL
  Filled 2016-12-23 (×3): qty 1

## 2016-12-23 MED ORDER — IPRATROPIUM BROMIDE 0.02 % IN SOLN
0.5000 mg | Freq: Four times a day (QID) | RESPIRATORY_TRACT | Status: DC
  Administered 2016-12-23: 0.5 mg via RESPIRATORY_TRACT
  Filled 2016-12-23 (×2): qty 2.5

## 2016-12-23 NOTE — Progress Notes (Addendum)
PROGRESS NOTE    Ashlee Williams  ZOX:[id number] DOB: [birth year] DOA: 12/22/2016 PCP: Sheliah Hatch, MD  Brief Narrative:Ashlee Williams is a 61 y.o. female with a history of HTN, AAA (last measured as 3.0cm in 2016), HLD, DM2, anemia, chronic headaches, obesity and tobacco abuse who presented to the ED last night following a syncopal event -She was found by her sister, unconscious after she heard a loud Thud, reportedly they gave her a few rest rescue breath and subsequently started coughing and regained consciousness. -She was recently treated about 3 weeks ago for bronchitis with prednisone and antibiotics and reports that her breathing has been improving. -She denies any chest pain/dizziness   Assessment & Plan:  1. Syncope -Etiology not very clear, she  Was orthostatic in the emergency room, but symptoms not very typical for this -Definitely has risk factors for underlying CAD/arrhythmias, no symptoms of ACS, EKG with flat T waves and troponins negative -Continue IV fluids today -Check 2-D echocardiogram and monitor on telemetry  2. HTN -BP stable now -Diovan/HCT held on admission last night, this will need to be changed at discharge due to recall with valsartan -Resume amlodipine  3. Diet-controlled diabetes mellitus (HCC) -Continue sliding scale, follow-up hemoglobin A1c  4. Recent bronchitis -Clinically I do not suspect active bronchitis now, stop Doxycycline  5. Right middle lobe pulmonary nodule -Needs follow-up  6. History of AAA -Follow-up with [facility]  7. OSA -continue CPAP  DVT prophylaxis: Lovenox Code Status:  Full COde Family Communication: none at bedside Disposition Plan: Home pending workup  Consultants:     Antimicrobials:   DOxy stopped 9/16   Subjective: Feels ok, no chest pain, bronchitis improved  Objective: Vitals:   12/23/16 0530 12/23/16 0600 12/23/16 0630 12/23/16 1130  BP: 119/73 (!) 146/92 125/86 (!) 156/87  Pulse: 63 (!)  55 64 61  Resp: (!) 21  Temp:    98.1 F (36.7 C)  TempSrc:    Oral  SpO2: 97% 99% 97% 94%  Weight:    124.6 kg (274 lb 11.2 oz)  Height:     (1.6 m)    Intake/Output Summary (Last 24 hours) at 12/23/16 1217 Last data filed at 12/22/16 2307  Gross per 24 hour  Intake             1000 ml  Output                0 ml  Net             1000 ml   Filed Weights   12/23/16 1130  Weight: 124.6 kg (274 lb 11.2 oz)    Examination:  General exam: Appears calm and comfortable  Respiratory system: Clear to auscultation. Respiratory effort normal. Cardiovascular system: S1 & S2 heard, RRR. No JVD, murmurs Gastrointestinal system: Abdomen is nondistended, soft and nontender. Normal bowel sounds heard. Central nervous system: Alert and oriented. No focal neurological deficits. Extremities: trace edema Skin: No rashes, lesions or ulcers Psychiatry: Judgement and insight appear normal. Mood & affect appropriate.     Data Reviewed:   CBC:  Recent Labs Lab 12/22/16 1602 12/23/16 0444  WBC 6.8 5.3  HGB 12.1 10.6*  HCT 38.6 33.7*  MCV 82.8 83.0  PLT 205 183   Basic Metabolic Panel:  Recent Labs Lab 12/22/16 1602 12/23/16 0444  NA 140 140  K 4.0 3.2*  CL 106 109  CO2 27 24  GLUCOSE 84 76  BUN 10  10  CREATININE 0.90 0.88  CALCIUM 8.9 8.3*  MG 2.0 1.9  PHOS  --  3.9   GFR: Estimated Creatinine Clearance: 87.3 mL/min (by C-G formula based on SCr of 0.88 mg/dL). Liver Function Tests:  Recent Labs Lab 12/23/16 0444  AST 14*  ALT 16  ALKPHOS 53  BILITOT 0.6  PROT 5.8*  ALBUMIN 2.8*   No results for input(s): LIPASE, AMYLASE in the last 168 hours. No results for input(s): AMMONIA in the last 168 hours. Coagulation Profile:  Recent Labs Lab 12/22/16 1602  INR 0.98   Cardiac Enzymes:  Recent Labs Lab 12/23/16 0039 12/23/16 0444  TROPONINI <0.03 <0.03   BNP (last 3 results) No results for input(s): PROBNP in the last 8760  hours. HbA1C:  Recent Labs  12/23/16 0444  HGBA1C 6.1*   CBG:  Recent Labs Lab 12/22/16 1613 12/23/16 0742  GLUCAP 73 83   Lipid Profile: No results for input(s): CHOL, HDL, LDLCALC, TRIG, CHOLHDL, LDLDIRECT in the last 72 hours. Thyroid Function Tests:  Recent Labs  12/23/16 0444  TSH 2.360   Anemia Panel: No results for input(s): VITAMINB12, FOLATE, FERRITIN, TIBC, IRON, RETICCTPCT in the last 72 hours. Urine analysis:    Component Value Date/Time   COLORURINE YELLOW 12/22/2016 1810   APPEARANCEUR CLEAR 12/22/2016 1810   LABSPEC 1.039 (H) 12/22/2016 1810   PHURINE 6.0 12/22/2016 1810   GLUCOSEU NEGATIVE 12/22/2016 1810   GLUCOSEU NEGATIVE 05/09/2007 1236   HGBUR NEGATIVE 12/22/2016 1810   BILIRUBINUR NEGATIVE 12/22/2016 1810   BILIRUBINUR neg 02/05/2011 1445   KETONESUR NEGATIVE 12/22/2016 1810   PROTEINUR NEGATIVE 12/22/2016 1810   UROBILINOGEN 0.2 11/16/2014 1120   NITRITE NEGATIVE 12/22/2016 1810   LEUKOCYTESUR NEGATIVE 12/22/2016 1810   Sepsis Labs: (procalcitonin:4,lacticidven:4)  )No results found for this or any previous visit (from the past 240 hour(s)).       Radiology Studies: Ct Head Wo Contrast  Result Date: 12/22/2016 CLINICAL DATA:  Left-sided headache. Right facial weakness for 3 weeks. EXAM: CT HEAD WITHOUT CONTRAST TECHNIQUE: Contiguous axial images were obtained from the base of the skull through the vertex without intravenous contrast. COMPARISON:  October 21, 2008 FINDINGS: Brain: No evidence of acute infarction, hemorrhage, hydrocephalus, extra-axial collection or mass lesion/mass effect. Vascular: No hyperdense vessel or unexpected calcification. Skull: Normal. Negative for fracture or focal lesion. Sinuses/Orbits: No acute finding. Other: None. IMPRESSION: No cause for the patient's symptoms identified. Electronically Signed   By: Gerome Sam III M.D   On: 12/22/2016 17:42   Ct Angio Chest/abd/pel For Dissection W And/or  W/wo  Result Date: 12/22/2016 CLINICAL DATA:  Syncope.  Unexplained loss of consciousness. EXAM: CT ANGIOGRAPHY CHEST, ABDOMEN AND PELVIS TECHNIQUE: Multidetector CT imaging through the chest, abdomen and pelvis was performed using the standard protocol during bolus administration of intravenous contrast. Multiplanar reconstructed images and MIPs were obtained and reviewed to evaluate the vascular anatomy. CONTRAST:  100 cc of Isovue 370 COMPARISON:  None. FINDINGS: CTA CHEST FINDINGS Cardiovascular: Preferential opacification of the thoracic aorta. No evidence of thoracic aortic aneurysm or dissection. Normal heart size. No pericardial effusion.Normal heart size. No pericardial effusion. Mediastinum/Nodes: No enlarged mediastinal, hilar, or axillary lymph nodes. Thyroid gland, trachea, and esophagus demonstrate no significant findings. Lungs/Pleura: No pleural effusion. No airspace consolidation identified. Calcified granuloma identified in the left lower lobe. Mild mosaic attenuation pattern is identified within the lungs. Right middle lobe pulmonary nodule measures 4 mm. Musculoskeletal: Degenerative disc disease identified within the lower thoracic spine.  Review of the MIP images confirms the above findings. CTA ABDOMEN AND PELVIS FINDINGS VASCULAR Aorta: Normal caliber aorta without aneurysm, dissection, vasculitis or significant stenosis. Celiac: Patent without evidence of aneurysm, dissection, vasculitis or significant stenosis. SMA: Patent without evidence of aneurysm, dissection, vasculitis or significant stenosis. Renals: Both renal arteries are patent without evidence of aneurysm, dissection, vasculitis, fibromuscular dysplasia or significant stenosis. IMA: Patent without evidence of aneurysm, dissection, vasculitis or significant stenosis. Inflow: Patent without evidence of aneurysm, dissection, vasculitis or significant stenosis. Veins: No obvious venous abnormality within the limitations of this  arterial phase study. Review of the MIP images confirms the above findings. NON-VASCULAR Hepatobiliary: No focal liver abnormality is seen. Status post cholecystectomy. No biliary dilatation. Pancreas: Unremarkable. No pancreatic ductal dilatation or surrounding inflammatory changes. Spleen: Normal in size without focal abnormality. Adrenals/Urinary Tract: Adrenal glands are unremarkable. Kidneys are normal, without renal calculi, focal lesion, or hydronephrosis. Bladder is unremarkable. Stomach/Bowel: Stomach is within normal limits. Appendix not confidently identified. No evidence of bowel wall thickening, distention, or inflammatory changes. Lymphatic: No enlarged abdominal or pelvic lymph nodes. Reproductive: Status post hysterectomy. No adnexal masses. Other: No abdominal wall hernia or abnormality. No abdominopelvic ascites. Musculoskeletal: No acute or significant osseous findings. Review of the MIP images confirms the above findings. IMPRESSION: 1. No acute vascular abnormality identified. No evidence for aortic dissection or aneurysm. 2. Mild mosaic attenuation pattern identified within the lungs. This is a nonspecific finding but may be seen with small airways disease. 3. Right middle lobe pulmonary nodule is identified measuring 4 mm. No follow-up needed if patient is low-risk. Non-contrast chest CT can be considered in 12 months if patient is high-risk. This recommendation follows the consensus statement: Guidelines for Management of Incidental Pulmonary Nodules Detected on CT Images: From the Fleischner Society 2017; Radiology 2017; 284:228-243. Electronically Signed   By: Signa Kell M.D.   On: 12/22/2016 18:03        Scheduled Meds: . atorvastatin  10 mg Oral Daily  . azelastine  2 spray Each Nare QHS   And  . fluticasone  2 spray Each Nare QHS  . doxycycline  100 mg Oral Q12H  . enoxaparin (LOVENOX) injection  40 mg Subcutaneous Daily  . guaiFENesin  600 mg Oral BID  . insulin  aspart  0-5 Units Subcutaneous QHS  . insulin aspart  0-9 Units Subcutaneous TID WC  . ipratropium  2 spray Each Nare TID AC & HS  . ipratropium  0.5 mg Nebulization Q6H  . loratadine  10 mg Oral Daily  . nicotine  14 mg Transdermal Daily  . predniSONE  40 mg Oral Q breakfast   Continuous Infusions: . sodium chloride 75 mL/hr at 12/23/16 1119     LOS: 0 days    Time spent:    Zannie Cove, MD [facility] Pager [phone]  If 7PM-7AM, please contact night-coverage [email or web address] Password [facility] 12/23/2016, 12:17 PM

## 2016-12-23 NOTE — ED Notes (Signed)
Ordered breakfast tray  

## 2016-12-23 NOTE — Evaluation (Signed)
Physical Therapy Evaluation Patient Details Name: Ashlee Williams MRN: [id number] DOB: November 12, 1955 Today's Date: 12/23/2016   History of Present Illness  Pt is a 61 y.o. female presenting after syncopal episode at home. PMHx: AAA, DM2, OSA on CPAP, HTN, HLD, morbid obesity.  Clinical Impression  Pt is close to baseline functioning, but still doesn't feel well.  She should be safe at home with available assist. There are no further acute PT needs.  Will sign off at this time.     Follow Up Recommendations No PT follow up    Equipment Recommendations  None recommended by PT    Recommendations for Other Services       Precautions / Restrictions Precautions Precautions: Fall Restrictions Weight Bearing Restrictions: No      Mobility  Bed Mobility Overal bed mobility: Modified Independent             General bed mobility comments: HOB elevated with increased time and effort  Transfers Overall transfer level: Needs assistance Equipment used: None Transfers: Sit to/from Stand Sit to Stand: Supervision         General transfer comment: supervision for safety; no unsteadiness or LOB  Ambulation/Gait Ambulation/Gait assistance: Supervision Ambulation Distance (Feet): 250 Feet Assistive device: None Gait Pattern/deviations: Step-through pattern;WFL(Within Functional Limits)     General Gait Details: pt reported not feeling able to walk at her normal speed without effort.  pt was steady, able to scan her whole envirionment, change direction abruptly, back up and change speeds without overt deviation or LOB  Stairs            Wheelchair Mobility    Modified Rankin (Stroke Patients Only)       Balance Overall balance assessment: No apparent balance deficits (not formally assessed)                                           Pertinent Vitals/Pain Pain Assessment: Faces Faces Pain Scale: No hurt    Home Living Family/patient expects to  be discharged to:: Private residence Living Arrangements: Non-relatives/Friends Available Help at Discharge: Family;Friend(s) Type of Home: House Home Access: Stairs to enter   Secretary/administrator of Steps: 4-5 Home Layout: One level Home Equipment: None      Prior Function Level of Independence: Independent         Comments: works as a Corporate treasurer; 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   Dominant Hand: Right    Extremity/Trunk Assessment   Upper Extremity Assessment Upper Extremity Assessment: Overall WFL for tasks assessed    Lower Extremity Assessment Lower Extremity Assessment: Overall WFL for tasks assessed       Communication   Communication: No difficulties  Cognition Arousal/Alertness: Awake/alert Behavior During Therapy: WFL for tasks assessed/performed Overall Cognitive Status: Within Functional Limits for tasks assessed                                        General Comments General comments (skin integrity, edema, etc.): reports of mild dizziness with position changes and standing, but resolved fairly quickly    Exercises     Assessment/Plan    PT Assessment Patent does not need any further PT services  PT Problem List         PT Treatment Interventions  PT Goals (Current goals can be found in the Care Plan section)  Acute Rehab PT Goals Patient Stated Goal: get better PT Goal Formulation: With patient    Frequency     Barriers to discharge        Co-evaluation               AM-PAC PT "6 Clicks" Daily Activity  Outcome Measure Difficulty turning over in bed (including adjusting bedclothes, sheets and blankets)?: None Difficulty moving from lying on back to sitting on the side of the bed? : None Difficulty sitting down on and standing up from a chair with arms (e.g., wheelchair, bedside commode, etc,.)?: None Help needed moving to and from a bed to chair (including a wheelchair)?: A Little Help needed  walking in hospital room?: A Little Help needed climbing 3-5 steps with a railing? : A Little 6 Click Score: 21    End of Session   Activity Tolerance: Patient tolerated treatment well Patient left: in bed;with call bell/phone within reach;with family/visitor present Nurse Communication: Mobility status PT Visit Diagnosis: Other abnormalities of gait and mobility (R26.89)    Time: 9147-8295 PT Time Calculation (min) (ACUTE ONLY): 16 min   Charges:   PT Evaluation $PT Eval Low Complexity: 1 Low     PT G Codes:   PT G-Codes **NOT FOR INPATIENT CLASS** Functional Assessment Tool Used: AM-PAC 6 Clicks Basic Mobility;Clinical judgement Functional Limitation: Mobility: Walking and moving around Mobility: Walking and Moving Around Current Status (A2130): At least 1 percent but less than 20 percent impaired, limited or restricted Mobility: Walking and Moving Around Goal Status [phone]): At least 1 percent but less than 20 percent impaired, limited or restricted Mobility: Walking and Moving Around Discharge Status [phone]): At least 1 percent but less than 20 percent impaired, limited or restricted    12/23/2016  [place] Bing, PT [phone]  (pager)  Ashlee Williams 12/23/2016, 5:07 PM

## 2016-12-23 NOTE — Progress Notes (Signed)
*  PRELIMINARY RESULTS* Echocardiogram 2D Echocardiogram has been performed.  Ashlee Williams 12/23/2016, 10:41 AM

## 2016-12-23 NOTE — Evaluation (Signed)
Occupational Therapy Evaluation Patient Details Name: Ashlee Williams MRN: [id number] DOB: 05/11/1955 Today's Date: 12/23/2016    History of Present Illness Pt is a 61 y.o. female presenting after syncopal episode at home. PMHx: AAA, DM2, OSA on CPAP, HTN, HLD, morbid obesity.   Clinical Impression   Pt reports she was independent with ADL PTA. Currently pt requires supervision for safety with ADL and functional mobility. Pt presenting with decreased activity tolerance and deconditioning; DOE 3/4. Pt planning to d/c home with supervision from family/friends. Pt would benefit from continued skilled OT to address established goals.    Follow Up Recommendations  No OT follow up;Supervision - Intermittent    Equipment Recommendations  None recommended by OT    Recommendations for Other Services       Precautions / Restrictions Precautions Precautions: Fall Restrictions Weight Bearing Restrictions: No      Mobility Bed Mobility Overal bed mobility: Modified Independent             General bed mobility comments: HOB elevated with increased time and effort  Transfers Overall transfer level: Needs assistance Equipment used: None Transfers: Sit to/from Stand Sit to Stand: Supervision         General transfer comment: supervision for safety; no unsteadiness or LOB    Balance Overall balance assessment: No apparent balance deficits (not formally assessed)                                         ADL either performed or assessed with clinical judgement   ADL Overall ADL's : Needs assistance/impaired Eating/Feeding: Set up;Sitting   Grooming: Supervision/safety;Standing;Wash/dry hands   Upper Body Bathing: Set up;Supervision/ safety;Sitting   Lower Body Bathing: Set up;Supervison/ safety;Sit to/from stand   Upper Body Dressing : Set up;Supervision/safety;Sitting   Lower Body Dressing: Set up;Supervision/safety;Sit to/from stand   Toilet  Transfer: Supervision/safety;Ambulation;Regular Toilet   Toileting- Architect and Hygiene: Supervision/safety;Sit to/from stand   Tub/ Shower Transfer: Supervision/safety;Tub transfer;Ambulation Tub/Shower Transfer Details (indicate cue type and reason): simulated Functional mobility during ADLs: Supervision/safety General ADL Comments: DOE 3/4. Began education on energy conservation strategies to use at home but would benefit from continued education.     Vision         Perception     Praxis      Pertinent Vitals/Pain Pain Assessment: Faces Faces Pain Scale: No hurt     Hand Dominance Right   Extremity/Trunk Assessment Upper Extremity Assessment Upper Extremity Assessment: Overall WFL for tasks assessed   Lower Extremity Assessment Lower Extremity Assessment: Defer to PT evaluation       Communication Communication Communication: No difficulties   Cognition Arousal/Alertness: Awake/alert Behavior During Therapy: WFL for tasks assessed/performed Overall Cognitive Status: Within Functional Limits for tasks assessed                                     General Comments       Exercises     Shoulder Instructions      Home Living Family/patient expects to be discharged to:: Private residence Living Arrangements: Non-relatives/Friends Available Help at Discharge: Family;Friend(s) Type of Home: House Home Access: Stairs to enter [employer] of Steps: 4-5   Home Layout: One level     Bathroom Shower/Tub: IT trainer: Standard  Home Equipment: None          Prior Functioning/Environment Level of Independence: Independent        Comments: works as a Corporate treasurer; office job        OT Problem List: Decreased activity tolerance;Cardiopulmonary status limiting activity;Obesity      OT Treatment/Interventions: Environmental manager;Therapeutic exercise;Energy  conservation;Therapeutic activities;Patient/family education    OT Goals(Current goals can be found in the care plan section) Acute Rehab OT Goals Patient Stated Goal: get better OT Goal Formulation: With patient Time For Goal Achievement: 01/06/17 Potential to Achieve Goals: Good ADL Goals Additional ADL Goal #1: Pt will independently verbally recall 3 energy conservation strategies and use during ADL. Additional ADL Goal #2: Pt will gather ADL items and perform ADL with mod I.  OT Frequency: Min 2X/week   Barriers to D/C:            Co-evaluation              AM-PAC PT "6 Clicks" Daily Activity     Outcome Measure Help from another person eating meals?: None Help from another person taking care of personal grooming?: A Little Help from another person toileting, which includes using toliet, bedpan, or urinal?: A Little Help from another person bathing (including washing, rinsing, drying)?: A Little Help from another person to put on and taking off regular upper body clothing?: A Little Help from another person to put on and taking off regular lower body clothing?: A Little 6 Click Score: 19   End of Session    Activity Tolerance: Patient tolerated treatment well Patient left: in bed;with call bell/phone within reach;with family/visitor present  OT Visit Diagnosis: Dizziness and giddiness (R42)                Time: 1533-1550 OT Time Calculation (min): 17 min Charges:  OT General Charges $OT Visit: 1 Visit OT Evaluation $OT Eval Moderate Complexity: 1 Mod G-Codes: OT G-codes **NOT FOR INPATIENT CLASS** Functional Assessment Tool Used: Clinical judgement Functional Limitation: Self care Self Care Current Status (W0981): At least 1 percent but less than 20 percent impaired, limited or restricted Self Care Goal Status (X9147): 0 percent impaired, limited or restricted   Fredric Mare A. Brett Albino, M.S., OTR/L Pager: [phone]  Gaye Alken 12/23/2016, 4:00 PM

## 2016-12-24 ENCOUNTER — Encounter (HOSPITAL_COMMUNITY): Payer: Self-pay | Admitting: General Practice

## 2016-12-24 ENCOUNTER — Observation Stay (HOSPITAL_COMMUNITY): Payer: 59

## 2016-12-24 DIAGNOSIS — E119 Type 2 diabetes mellitus without complications: Secondary | ICD-10-CM | POA: Diagnosis not present

## 2016-12-24 DIAGNOSIS — R55 Syncope and collapse: Secondary | ICD-10-CM | POA: Diagnosis not present

## 2016-12-24 DIAGNOSIS — G4733 Obstructive sleep apnea (adult) (pediatric): Secondary | ICD-10-CM

## 2016-12-24 DIAGNOSIS — E785 Hyperlipidemia, unspecified: Secondary | ICD-10-CM | POA: Diagnosis not present

## 2016-12-24 DIAGNOSIS — Z720 Tobacco use: Secondary | ICD-10-CM

## 2016-12-24 DIAGNOSIS — Z6841 Body Mass Index (BMI) 40.0 and over, adult: Secondary | ICD-10-CM | POA: Diagnosis not present

## 2016-12-24 DIAGNOSIS — I714 Abdominal aortic aneurysm, without rupture: Secondary | ICD-10-CM | POA: Diagnosis not present

## 2016-12-24 DIAGNOSIS — R079 Chest pain, unspecified: Secondary | ICD-10-CM | POA: Diagnosis not present

## 2016-12-24 DIAGNOSIS — I10 Essential (primary) hypertension: Secondary | ICD-10-CM | POA: Diagnosis not present

## 2016-12-24 DIAGNOSIS — F17210 Nicotine dependence, cigarettes, uncomplicated: Secondary | ICD-10-CM | POA: Diagnosis not present

## 2016-12-24 LAB — CBC
HEMATOCRIT: 36.4 % (ref 36.0–46.0)
HEMOGLOBIN: 11.3 g/dL — AB (ref 12.0–15.0)
MCH: 25.5 pg — ABNORMAL LOW (ref 26.0–34.0)
MCHC: 31 g/dL (ref 30.0–36.0)
MCV: 82.2 fL (ref 78.0–100.0)
Platelets: 200 10*3/uL (ref 150–400)
RBC: 4.43 MIL/uL (ref 3.87–5.11)
RDW: 15.6 % — ABNORMAL HIGH (ref 11.5–15.5)
WBC: 7 10*3/uL (ref 4.0–10.5)

## 2016-12-24 LAB — BASIC METABOLIC PANEL
ANION GAP: 5 (ref 5–15)
BUN: 11 mg/dL (ref 6–20)
CHLORIDE: 112 mmol/L — AB (ref 101–111)
CO2: 24 mmol/L (ref 22–32)
CREATININE: 0.83 mg/dL (ref 0.44–1.00)
Calcium: 8.5 mg/dL — ABNORMAL LOW (ref 8.9–10.3)
GFR calc non Af Amer: >60 mL/min (ref >60)
Glucose, Bld: 88 mg/dL (ref 65–99)
POTASSIUM: 3.3 mmol/L — AB (ref 3.5–5.1)
Sodium: 141 mmol/L (ref 135–145)

## 2016-12-24 LAB — GLUCOSE, CAPILLARY
GLUCOSE-CAPILLARY: 107 mg/dL — AB (ref 65–99)
Glucose-Capillary: 90 mg/dL (ref 65–99)
Glucose-Capillary: 110 mg/dL — ABNORMAL HIGH (ref 65–99)

## 2016-12-24 MED ORDER — LOSARTAN POTASSIUM-HCTZ 50-12.5 MG PO TABS: 1.0000 | ORAL_TABLET | Freq: Every day | ORAL | 11 refills | Status: DC

## 2016-12-24 MED ORDER — POTASSIUM CHLORIDE CRYS ER 20 MEQ PO TBCR
40.0000 meq | EXTENDED_RELEASE_TABLET | Freq: Once | ORAL | Status: AC
  Administered 2016-12-24: 40 meq via ORAL
  Filled 2016-12-24: qty 2

## 2016-12-24 MED ORDER — NICOTINE 14 MG/24HR TD PT24: 14.0000 mg | MEDICATED_PATCH | Freq: Every day | TRANSDERMAL | 0 refills | Status: DC

## 2016-12-24 MED ORDER — AMLODIPINE BESYLATE 10 MG PO TABS: 10.0000 mg | ORAL_TABLET | Freq: Every day | ORAL | 0 refills | Status: DC

## 2016-12-24 MED ORDER — INFLUENZA VAC SPLIT QUAD 0.5 ML IM SUSY: 0.5000 mL | PREFILLED_SYRINGE | INTRAMUSCULAR | Status: DC

## 2016-12-24 NOTE — Discharge Summary (Signed)
Physician Discharge Summary  PERRIS TRIPATHI ZOX:[id number] DOB: 1955-11-25 DOA: 12/22/2016  PCP: Sheliah Hatch, MD  Admit date: 12/22/2016 Discharge date: 12/24/2016   Recommendations for Outpatient Follow-Up:   1. Patient to follow up with dentist re: orthopantogram- list of dentist provided 2. Tobacco cessation 3. Follow up of RML pulm nodule   Discharge Diagnosis:   Active Problems:   Diet-controlled diabetes mellitus (HCC)   Tobacco user   Essential hypertension   Hyperlipidemia associated with type 2 diabetes mellitus (HCC)   Obstructive sleep apnea   Syncope   Bronchitis   Discharge disposition:  Home  Discharge Condition: Improved.  Diet recommendation: Low sodium, heart healthy.  Carbohydrate-modified.  Wound care: None.   History of Present Illness:   Ashlee Williams is a 61 y.o. female with medical history significant of AAA, DM2, OSA on CPAP, HTN, HLD, morbid obesity, Tobacco abuse      Presented with syncopal when patient went to the kitchen she reports feeling dizzy and then passed out family found her shortly thereafter sitting up propped against a wall. She did not hit her head patient per family initially felt to be apneic they gave her rescue breath patient woke up and started coughingl patient had another episode of syncopal again shortly after. Denies any associated chest pain  no palpitations no leg swelling no recent fevers or chills. He has been diagnosed with bronchitis recently been having some cough  And slight shortness of breath and occasional wheezing she still smokes. for past few weeks she was treated with antibioticsand prednisone. She has finished a course of steroids states initially she felt better but then got worse again, Some  relief from Albuterol. No sick contacts.  No known hx of COPD. Reports decreased PO intake and feeling lightheaded for the past 3 weeks.   Hospital Course by Problem:    Syncope - orthostatic in  the emergency room -echo: Left ventricle: The cavity size was normal. Wall thickness was increased in a pattern of mild LVH. Systolic function was normal.  The estimated ejection fraction was in the range of 55% to 60%.  Images were inadequate for LV wall motion assessment. Diastolic dysfunction, grade indeterminate. Indeterminate filling pressures.  HTN -change combo BP Medication as valsartan recalled   Diet-controlled diabetes mellitus (HCC) -HgbA1C: 6.1  Recent bronchitis -Clinically I do not suspect active bronchitis now   Right middle lobe pulmonary nodule -Needs follow-up  History of AAA -Follow-up with [facility]   OSA -continue CPAP    Medical Consultants:    None.   Discharge Exam:   Vitals:   12/24/16 0958 12/24/16 1100  BP: (!) 147/76   Pulse:    Resp:    Temp:    SpO2:  100%   Vitals:   12/23/16 2156 12/24/16 0522 12/24/16 0958 12/24/16 1100  BP: 134/84 (!) 151/79 (!) 147/76   Pulse: 70 77    Resp: 17 18    Temp: 98.2 F (36.8 C) 98.2 F (36.8 C)    TempSrc: Oral Oral    SpO2: 98% 100%  100%  Weight:      Height:        Gen:  NAD   The results of significant diagnostics from this hospitalization (including imaging, microbiology, ancillary and laboratory) are listed below for reference.     Procedures and Diagnostic Studies:   Ct Head Wo Contrast  Result Date: 12/22/2016 CLINICAL DATA:  Left-sided headache. Right facial weakness for 3 weeks. EXAM:  CT HEAD WITHOUT CONTRAST TECHNIQUE: Contiguous axial images were obtained from the base of the skull through the vertex without intravenous contrast. COMPARISON:  October 21, 2008 FINDINGS: Brain: No evidence of acute infarction, hemorrhage, hydrocephalus, extra-axial collection or mass lesion/mass effect. Vascular: No hyperdense vessel or unexpected calcification. Skull: Normal. Negative for fracture or focal lesion. Sinuses/Orbits: No acute finding. Other: None. IMPRESSION: No cause for the patient's  symptoms identified. Electronically Signed   By: Gerome Sam III M.D   On: 12/22/2016 17:42   Ct Angio Chest/abd/pel For Dissection W And/or W/wo  Result Date: 12/22/2016 CLINICAL DATA:  Syncope.  Unexplained loss of consciousness. EXAM: CT ANGIOGRAPHY CHEST, ABDOMEN AND PELVIS TECHNIQUE: Multidetector CT imaging through the chest, abdomen and pelvis was performed using the standard protocol during bolus administration of intravenous contrast. Multiplanar reconstructed images and MIPs were obtained and reviewed to evaluate the vascular anatomy. CONTRAST:  100 cc of Isovue 370 COMPARISON:  None. FINDINGS: CTA CHEST FINDINGS Cardiovascular: Preferential opacification of the thoracic aorta. No evidence of thoracic aortic aneurysm or dissection. Normal heart size. No pericardial effusion.Normal heart size. No pericardial effusion. Mediastinum/Nodes: No enlarged mediastinal, hilar, or axillary lymph nodes. Thyroid gland, trachea, and esophagus demonstrate no significant findings. Lungs/Pleura: No pleural effusion. No airspace consolidation identified. Calcified granuloma identified in the left lower lobe. Mild mosaic attenuation pattern is identified within the lungs. Right middle lobe pulmonary nodule measures 4 mm. Musculoskeletal: Degenerative disc disease identified within the lower thoracic spine. Review of the MIP images confirms the above findings. CTA ABDOMEN AND PELVIS FINDINGS VASCULAR Aorta: Normal caliber aorta without aneurysm, dissection, vasculitis or significant stenosis. Celiac: Patent without evidence of aneurysm, dissection, vasculitis or significant stenosis. SMA: Patent without evidence of aneurysm, dissection, vasculitis or significant stenosis. Renals: Both renal arteries are patent without evidence of aneurysm, dissection, vasculitis, fibromuscular dysplasia or significant stenosis. IMA: Patent without evidence of aneurysm, dissection, vasculitis or significant stenosis. Inflow: Patent  without evidence of aneurysm, dissection, vasculitis or significant stenosis. Veins: No obvious venous abnormality within the limitations of this arterial phase study. Review of the MIP images confirms the above findings. NON-VASCULAR Hepatobiliary: No focal liver abnormality is seen. Status post cholecystectomy. No biliary dilatation. Pancreas: Unremarkable. No pancreatic ductal dilatation or surrounding inflammatory changes. Spleen: Normal in size without focal abnormality. Adrenals/Urinary Tract: Adrenal glands are unremarkable. Kidneys are normal, without renal calculi, focal lesion, or hydronephrosis. Bladder is unremarkable. Stomach/Bowel: Stomach is within normal limits. Appendix not confidently identified. No evidence of bowel wall thickening, distention, or inflammatory changes. Lymphatic: No enlarged abdominal or pelvic lymph nodes. Reproductive: Status post hysterectomy. No adnexal masses. Other: No abdominal wall hernia or abnormality. No abdominopelvic ascites. Musculoskeletal: No acute or significant osseous findings. Review of the MIP images confirms the above findings. IMPRESSION: 1. No acute vascular abnormality identified. No evidence for aortic dissection or aneurysm. 2. Mild mosaic attenuation pattern identified within the lungs. This is a nonspecific finding but may be seen with small airways disease. 3. Right middle lobe pulmonary nodule is identified measuring 4 mm. No follow-up needed if patient is low-risk. Non-contrast chest CT can be considered in 12 months if patient is high-risk. This recommendation follows the consensus statement: Guidelines for Management of Incidental Pulmonary Nodules Detected on CT Images: From the Fleischner Society 2017; Radiology 2017; 284:228-243. Electronically Signed   By: Signa Kell M.D.   On: 12/22/2016 18:03     Labs:   Basic Metabolic Panel:  Recent Labs Lab 12/22/16 1602 12/23/16 0444 12/24/16  0342  NA 140 140 141  K 4.0 3.2* 3.3*  CL  106 109 112*  CO2 GLUCOSE 84 76 88  BUN CREATININE 0.90 0.88 0.83  CALCIUM 8.9 8.3* 8.5*  MG 2.0 1.9  --   PHOS  --  3.9  --    GFR Estimated Creatinine Clearance: 92.5 mL/min (by C-G formula based on SCr of 0.83 mg/dL). Liver Function Tests:  Recent Labs Lab 12/23/16 0444  AST 14*  ALT 16  ALKPHOS 53  BILITOT 0.6  PROT 5.8*  ALBUMIN 2.8*   No results for input(s): LIPASE, AMYLASE in the last 168 hours. No results for input(s): AMMONIA in the last 168 hours. Coagulation profile  Recent Labs Lab 12/22/16 1602  INR 0.98    CBC:  Recent Labs Lab 12/22/16 1602 12/23/16 0444 12/24/16 0342  WBC 6.8 5.3 7.0  HGB 12.1 10.6* 11.3*  HCT 38.6 33.7* 36.4  MCV 82.8 83.0 82.2  PLT 205 183 200   Cardiac Enzymes:  Recent Labs Lab 12/23/16 0039 12/23/16 0444  TROPONINI <0.03 <0.03   BNP: Invalid input(s): POCBNP CBG:  Recent Labs Lab 12/23/16 1235 12/23/16 1706 12/23/16 2150 12/24/16 0743 12/24/16 1226  GLUCAP 108* 101* 125* 90 107*   D-Dimer No results for input(s): DDIMER in the last 72 hours. Hgb A1c  Recent Labs  12/23/16 0444  HGBA1C 6.1*   Lipid Profile No results for input(s): CHOL, HDL, LDLCALC, TRIG, CHOLHDL, LDLDIRECT in the last 72 hours. Thyroid function studies  Recent Labs  12/23/16 0444  TSH 2.360   Anemia work up No results for input(s): VITAMINB12, FOLATE, FERRITIN, TIBC, IRON, RETICCTPCT in the last 72 hours. Microbiology No results found for this or any previous visit (from the past 240 hour(s)).   Discharge Instructions:    Allergies as of 12/24/2016   No Known Allergies     Medication List    STOP taking these medications   benzonatate 100 MG capsule Commonly known as:  TESSALON   valsartan-hydrochlorothiazide 320-12.5 MG tablet Commonly known as:  DIOVAN-HCT     TAKE these medications   amLODipine 10 MG tablet Commonly known as:  NORVASC Take 1 tablet (10 mg total) by mouth  daily. What changed:  medication strength  See the new instructions.   atorvastatin 10 MG tablet Commonly known as:  LIPITOR TAKE 1 TABLET BY MOUTH DAILY. What changed:  See the new instructions.   Azelastine-Fluticasone 137-50 MCG/ACT Susp Commonly known as:  DYMISTA Place 2 sprays into both nostrils at bedtime.   butalbital-acetaminophen-caffeine 50-325-40 MG tablet Commonly known as:  FIORICET, ESGIC Take 1-2 tablets by mouth every 6 (six) hours as needed for headache.   CALCIUM 500/D 500-200 MG-UNIT tablet Generic drug:  calcium-vitamin D Take 1 tablet by mouth.   cetirizine 10 MG tablet Commonly known as:  ZYRTEC Take 10 mg by mouth daily.   CLEAR EYES COMPLETE Soln Place 1 drop into both eyes daily as needed (dry eyes).   ipratropium 0.06 % nasal spray Commonly known as:  ATROVENT Place 2 sprays into both nostrils 4 (four) times daily.   losartan-hydrochlorothiazide 50-12.5 MG tablet Commonly known as:  HYZAAR Take 1 tablet by mouth daily.   MIDRIN 65-100-325 MG capsule Generic drug:  isometheptene-acetaminophen-dichloralphenazone Take 1 capsule by mouth every 6 (six) hours as needed for headache.   multivitamin with minerals tablet Take 1 tablet by mouth daily.   nicotine 14 mg/24hr patch Commonly known as:  NICODERM CQ - dosed in mg/24 hours Place 1 patch (14 mg total) onto the skin daily.   ondansetron 4 MG disintegrating tablet Commonly known as:  ZOFRAN ODT Take 1 tablet (4 mg total) by mouth every 8 (eight) hours as needed for nausea or vomiting.   VENTOLIN HFA 108 (90 Base) MCG/ACT inhaler Generic drug:  albuterol INHALE 2 PUFFS INTO THE LUNGS EVERY 4 HOURS AS NEEDED FOR WHEEZING OR SHORTNESS OF BREATH.   zolpidem 5 MG tablet Commonly known as:  AMBIEN Take 1 tablet (5 mg total) by mouth at bedtime as needed for sleep.            Discharge Care Instructions        Start     Ordered   12/25/16 0000  amLODipine (NORVASC) 10 MG tablet   Daily     12/24/16 1359   12/25/16 0000  nicotine (NICODERM CQ - DOSED IN MG/24 HOURS) 14 mg/24hr patch  Daily     12/24/16 1359   12/24/16 0000  losartan-hydrochlorothiazide (HYZAAR) 50-12.5 MG tablet  Daily     12/24/16 1359     Follow-up Information    Sheliah Hatch, MD Follow up in 1 week(s).   Specialty:  Family Medicine Why:  needs referral to dentist Contact information: [address] [place] [address] [phone]            Time coordinating discharge: 35  min  Signed:  Paloma Grange U Jadeyn Hargett   [facility] 12/24/2016, 2:00 PM

## 2016-12-24 NOTE — Progress Notes (Signed)
Ashlee Williams to be D/C'd Home per MD order.  Discussed with the patient and all questions fully answered.  VSS, Skin clean, dry and intact without evidence of skin break down, no evidence of skin tears noted. IV catheter discontinued intact. Site without signs and symptoms of complications. Dressing and pressure applied.  An After Visit Summary was printed and given to the patient. Patient received prescription.  D/c education completed with patient/family including follow up instructions, medication list, d/c activities limitations if indicated, with other d/c instructions as indicated by MD - patient able to verbalize understanding, all questions fully answered.   Patient instructed to return to ED, call 911, or call MD for any changes in condition.   Patient escorted via WC, and D/C home via private auto.  Theodosia Blender 12/24/2016 5:37 PM

## 2016-12-24 NOTE — Progress Notes (Signed)
CM provided pt with a list of primary dentists in [place], [place] as requested per MD. Gae Gallop RN,BSN, CM

## 2016-12-25 ENCOUNTER — Telehealth: Payer: Self-pay

## 2016-12-25 LAB — HIV ANTIBODY (ROUTINE TESTING W REFLEX): HIV Screen 4th Generation wRfx: NONREACTIVE

## 2016-12-25 NOTE — Telephone Encounter (Signed)
LM requesting call back to complete TCM and schedule hospital follow up.   

## 2016-12-25 NOTE — Telephone Encounter (Signed)
Transition Care Management Follow-up Telephone Call   Date discharged? 12/24/16   How have you been since you were released from the hospital? "Okay"   Do you understand why you were in the hospital? yes, "dehydration"   Do you understand the discharge instructions? yes   Where were you discharged to? Home.   Items Reviewed:  Medications reviewed: yes  Allergies reviewed: yes  Dietary changes reviewed: yes  Referrals reviewed: yes   Functional Questionnaire:   Activities of Daily Living (ADLs):   She states they are independent in the following: ambulation, bathing and hygiene, feeding, continence, grooming, toileting and dressing States they require assistance with the following: None   Any transportation issues/concerns?: no   Any patient concerns? yes, Pt does not plan to take new HTN medications until f/u with PCP. Patient states BP was taken at wrong place on are resulting in a higher BP. Will continue current doses prescribed by PCP. Advised to monitor BP at home, call with high readings.    Confirmed importance and date/time of follow-up visits scheduled yes  Provider Appointment booked with PCP 01/01/17 @ 11.   Confirmed with patient if condition begins to worsen call PCP or go to the ER.  Patient was given the office number and encouraged to call back with question or concerns.  : yes

## 2016-12-28 ENCOUNTER — Emergency Department (HOSPITAL_COMMUNITY): Admission: EM | Admit: 2016-12-28 | Discharge: 2016-12-28 | Discharge status: Against Medical Advice | Payer: 59

## 2016-12-28 DIAGNOSIS — G4489 Other headache syndrome: Secondary | ICD-10-CM | POA: Diagnosis not present

## 2016-12-28 DIAGNOSIS — R52 Pain, unspecified: Secondary | ICD-10-CM | POA: Diagnosis not present

## 2016-12-28 NOTE — ED Triage Notes (Signed)
Per EMs- Patient c/o frontal headache. Patient took Ibuprofen with no relief. Stroke scale negative. Patient denies CP, N/V, or vision problems.  EKG-NSR.

## 2017-01-01 ENCOUNTER — Ambulatory Visit (INDEPENDENT_AMBULATORY_CARE_PROVIDER_SITE_OTHER): Payer: 59 | Admitting: Family Medicine

## 2017-01-01 ENCOUNTER — Encounter: Payer: Self-pay | Admitting: Family Medicine

## 2017-01-01 VITALS — BP 130/86 | HR 73 | Temp 98.1°F | Resp 16 | Ht 63.0 in | Wt 277.4 lb

## 2017-01-01 DIAGNOSIS — Z720 Tobacco use: Secondary | ICD-10-CM | POA: Diagnosis not present

## 2017-01-01 DIAGNOSIS — R0602 Shortness of breath: Secondary | ICD-10-CM | POA: Diagnosis not present

## 2017-01-01 DIAGNOSIS — I10 Essential (primary) hypertension: Secondary | ICD-10-CM | POA: Diagnosis not present

## 2017-01-01 DIAGNOSIS — R911 Solitary pulmonary nodule: Secondary | ICD-10-CM | POA: Insufficient documentation

## 2017-01-01 MED ORDER — GLYCOPYRROLATE-FORMOTEROL 9-4.8 MCG/ACT IN AERO: 2.0000 | INHALATION_SPRAY | Freq: Two times a day (BID) | RESPIRATORY_TRACT | 6 refills | Status: DC

## 2017-01-01 MED ORDER — LOSARTAN POTASSIUM-HCTZ 100-12.5 MG PO TABS: 1.0000 | ORAL_TABLET | Freq: Every day | ORAL | 3 refills | Status: DC

## 2017-01-01 MED FILL — AMLODIPINE BESYLATE 10 MG T: 10 | 30 days supply | Qty: 30 | Fill #0

## 2017-01-01 MED FILL — LOSARTAN-HCTZ 100-12.5 MG T: 100-12.5 | 30 days supply | Qty: 30 | Fill #0

## 2017-01-01 NOTE — Progress Notes (Signed)
Pre visit review using our clinic review tool, if applicable. No additional management support is needed unless otherwise documented below in the visit note. 

## 2017-01-01 NOTE — Assessment & Plan Note (Signed)
Concern for COPD given pt's extensive smoking hx and SOB w/ exertion.  She has seen Dr Maple Hudson for OSA- will refer back for complete evaluation.  In the interim, will start Bevespi to improve bronchodilation and help w/ SOB.  Pt expressed understanding and is in agreement w/ plan.

## 2017-01-01 NOTE — Patient Instructions (Signed)
Follow up in 3 months to recheck diabetes We'll call you with your Pulmonary appt to help with the shortness of breath Start the Bevespi inhaler- 2 puffs, twice daily Use the Albuterol inhaler as needed for shortness of breath We will repeat your CT scan next September to monitor the nodule that was found Make sure you are drinking plenty of fluids!!!! Call with any questions or concerns Hang in there!!!

## 2017-01-01 NOTE — Progress Notes (Signed)
   Subjective:    Patient ID: Ashlee Williams, female    DOB: May 14, 1955, 61 y.o.   MRN: [id number]  HPI Hospital f/u- pt was admitted 9/15-17 after 2 separate syncopal episodes.  Pt was found to be orthostatic in the ER and dehydrated.  Pt reports feeling better after fluids.  Head CT WNL.  Chest CT showed 4mm pulmonary nodule in RML and repeat CT in 1 yr was recommend.  BP medication was changed from Valsartan 320/HCTZ 12.5 to Losartan /12.5mg .  Pt reports home BPs continue to fluctuate and will run 'high'.  Pt continues to have SOB w/ exertion- has never seen pulmonary.     Review of Systems For ROS see HPI     Objective:   Physical Exam  Constitutional: She is oriented to person, place, and time. She appears well-developed and well-nourished. No distress.  obese  HENT:  Head: Normocephalic and atraumatic.  Eyes: Pupils are equal, round, and reactive to light. Conjunctivae and EOM are normal.  Neck: Normal range of motion. Neck supple. No thyromegaly present.  Cardiovascular: Normal rate, regular rhythm, normal heart sounds and intact distal pulses.   No murmur heard. Pulmonary/Chest: Effort normal and breath sounds normal. No respiratory distress.  Decreased air movement and coarse BS throughout but no wheezing noted  Abdominal: Soft. She exhibits no distension. There is no tenderness.  Musculoskeletal: She exhibits no edema.  Lymphadenopathy:    She has no cervical adenopathy.  Neurological: She is alert and oriented to person, place, and time.  Skin: Skin is warm and dry.  Psychiatric: She has a normal mood and affect. Her behavior is normal.  Vitals reviewed.         Assessment & Plan:

## 2017-01-01 NOTE — Assessment & Plan Note (Signed)
New.  Seen on CT done during recent hospitalization.  Recommendations are repeat CT in 1 yr.

## 2017-01-01 NOTE — Assessment & Plan Note (Signed)
Chronic problem.  Adequate control today but she reports home BPs are elevated.  Will increase Losartan to  since Valsartan dose was  daily.  Pt to continue home BP monitoring and reach out if BP consistently high or low.  Pt expressed understanding and is in agreement w/ plan.

## 2017-01-02 ENCOUNTER — Telehealth: Payer: Self-pay | Admitting: General Practice

## 2017-01-02 DIAGNOSIS — R490 Dysphonia: Secondary | ICD-10-CM | POA: Diagnosis not present

## 2017-01-02 DIAGNOSIS — K219 Gastro-esophageal reflux disease without esophagitis: Secondary | ICD-10-CM | POA: Diagnosis not present

## 2017-01-02 NOTE — Telephone Encounter (Signed)
Called pt and LMOVM to inform that per pharmacy we have to change her Bevespi to symbicort 80/4.20mcg 2 puffs BID. #1 with 3 refills.   Waiting on pt to return call before I send to pharmacy.

## 2017-01-04 MED ORDER — BUDESONIDE-FORMOTEROL FUMARATE 80-4.5 MCG/ACT IN AERO: 2.0000 | INHALATION_SPRAY | Freq: Two times a day (BID) | RESPIRATORY_TRACT | 3 refills | Status: DC

## 2017-01-04 NOTE — Telephone Encounter (Signed)
Pt never returned call. New medication filled to pharmacy.

## 2017-01-04 NOTE — Addendum Note (Signed)
Addended by: Geannie Risen on: 01/04/2017 04:38 PM   Modules accepted: Orders

## 2017-01-07 ENCOUNTER — Ambulatory Visit (INDEPENDENT_AMBULATORY_CARE_PROVIDER_SITE_OTHER): Payer: 59 | Admitting: Pulmonary Disease

## 2017-01-07 ENCOUNTER — Encounter: Payer: Self-pay | Admitting: Pulmonary Disease

## 2017-01-07 DIAGNOSIS — J040 Acute laryngitis: Secondary | ICD-10-CM

## 2017-01-07 DIAGNOSIS — J310 Chronic rhinitis: Secondary | ICD-10-CM | POA: Diagnosis not present

## 2017-01-07 NOTE — Assessment & Plan Note (Signed)
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

## 2017-01-07 NOTE — Assessment & Plan Note (Signed)
Okay to take dymista-  1 spray each nare at bedtime  Ct atroven once secretions clear

## 2017-01-07 NOTE — Progress Notes (Signed)
   Subjective:    Patient ID: Ashlee Williams, female    DOB: 1956-03-01, 61 y.o.   MRN: [id number]  HPI   Chief Complaint  Patient presents with  . Pulm Consult    Referred by Dr. Beverely Low for SOB and nasal congestion. States this has been going on for about a month now. Has already been cleared by ENT.     61 year old smoker with severe OSA who is followed by my partner Dr. Maple Hudson, employee of our office. Her main complaint today is hoarseness of voice and nasal congestion which is been ongoing for a month. She saw ENT and was advised vocal cord rest and given treatment for reflux. She denies obvious heartburn or wheezing. 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 AAA (abdominal aortic aneurysm) (HCC)   . Allergy   . Anemia   . Asthma   . Blood in stool   . Bronchitis   . Chicken pox   . Chronic headaches   . Diabetes mellitus    PT. IS TRYING TO CONTROL WITH DIET BUT AS NOTED WEIGHT IS UP  . Hypertension   . Obesity   . Syncope and collapse 12/22/2016  . Tobacco abuse      Review of Systems   neg for any significant sore throat, dysphagia, itching, sneezing, nasal congestion or excess/ purulent secretions, fever, chills, sweats, unintended wt loss, pleuritic or exertional cp, hempoptysis, orthopnea pnd or change in chronic leg swelling. Also denies presyncope, palpitations, heartburn, abdominal pain, nausea, vomiting, diarrhea or change in bowel or urinary habits, dysuria,hematuria, rash, arthralgias, visual complaints, headache, numbness weakness or ataxia.     Objective:   Physical Exam   Gen. Pleasant, obese, in no distress ENT - no lesions, no post nasal drip Neck: No JVD, no thyromegaly, no carotid bruits Lungs: no use of accessory muscles, no dullness to percussion, decreased without rales or rhonchi  Cardiovascular: Rhythm regular, heart sounds  normal, no murmurs or gallops, no peripheral edema Musculoskeletal: No deformities, no cyanosis or clubbing , no tremors        Assessment & Plan:

## 2017-01-07 NOTE — Patient Instructions (Signed)
Stop taking Symbicort and Bevespi Okay to take dymista-  1 spray each nare at bedtime  Letter -out of work for 2 days

## 2017-01-10 ENCOUNTER — Telehealth: Payer: Self-pay | Admitting: Pulmonary Disease

## 2017-01-10 NOTE — Telephone Encounter (Signed)
Received paperwork from [employer] regarding the patient's FMLA. Paperwork has been filled out and signed by RA. Paperwork was faxed today. Will keep in my look-at folder for 2 weeks just in case the fax does not go through.

## 2017-01-14 ENCOUNTER — Telehealth: Payer: Self-pay | Admitting: Family Medicine

## 2017-01-14 NOTE — Telephone Encounter (Signed)
Received FMLA paperwork by fax, placed in bin upfront with charge sheet. °

## 2017-01-16 DIAGNOSIS — G4733 Obstructive sleep apnea (adult) (pediatric): Secondary | ICD-10-CM | POA: Diagnosis not present

## 2017-01-17 NOTE — Telephone Encounter (Signed)
I don't know what dates pt missed work.  Please call and clarify.  She was hospitalized in September and I did her hospital f/u but I don't know how long (or when) she missed work

## 2017-01-17 NOTE — Telephone Encounter (Signed)
Called patient and LMOVM to return call.     

## 2017-01-18 NOTE — Telephone Encounter (Signed)
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 Pt returned to work on [employer] 9/26-27. Pt states that she went in to work on Fri 9/28 and left early due to her BP.

## 2017-01-21 NOTE — Telephone Encounter (Signed)
Forms have been faxed 

## 2017-01-21 NOTE — Telephone Encounter (Signed)
Forms completed and placed in basket 

## 2017-02-01 ENCOUNTER — Telehealth: Payer: Self-pay | Admitting: Pulmonary Disease

## 2017-02-01 NOTE — Telephone Encounter (Signed)
Will review forms when I am not in triage.

## 2017-02-01 NOTE — Telephone Encounter (Signed)
[facility] and [facility], thanks

## 2017-02-04 NOTE — Telephone Encounter (Signed)
Forms have been completed and faxed. Will close this message.

## 2017-02-05 ENCOUNTER — Telehealth: Payer: Self-pay | Admitting: Pulmonary Disease

## 2017-02-05 MED FILL — LOSARTAN-HCTZ 100-12.5 MG T: 100-12.5 | 30 days supply | Qty: 30 | Fill #1

## 2017-02-05 MED FILL — ZOLPIDEM TARTRATE 5 MG TABL: 5 | 30 days supply | Qty: 30 | Fill #1

## 2017-02-05 MED FILL — ATORVASTATIN 10 MG TABLET: 10 | 30 days supply | Qty: 30 | Fill #5

## 2017-02-05 NOTE — Telephone Encounter (Signed)
Spoke with Ashlee Williams, who wanted to verify dates listed on pt's FMLA. Forms were completed for 01/07/17 to 01/08/17. Ms. Ashlee Williams wanted to verify that is correct or was meant for one year.  RA was advise. Thanks.

## 2017-02-05 NOTE — Telephone Encounter (Signed)
Correct dates - only gave her 2 days off - not a recurrent condition

## 2017-02-05 NOTE — Telephone Encounter (Signed)
Called and spoke with Ashlee Williams and she is aware of RA recs that it was only for 2 days.

## 2017-02-16 DIAGNOSIS — G4733 Obstructive sleep apnea (adult) (pediatric): Secondary | ICD-10-CM | POA: Diagnosis not present

## 2017-03-18 DIAGNOSIS — G4733 Obstructive sleep apnea (adult) (pediatric): Secondary | ICD-10-CM | POA: Diagnosis not present

## 2017-03-21 MED FILL — LOSARTAN-HCTZ 100-12.5 MG T: 100-12.5 | 30 days supply | Qty: 30 | Fill #2

## 2017-03-21 MED FILL — ATORVASTATIN 10 MG TABLET: 10 | 30 days supply | Qty: 30 | Fill #6

## 2017-04-18 DIAGNOSIS — G4733 Obstructive sleep apnea (adult) (pediatric): Secondary | ICD-10-CM | POA: Diagnosis not present

## 2017-04-19 ENCOUNTER — Ambulatory Visit (INDEPENDENT_AMBULATORY_CARE_PROVIDER_SITE_OTHER): Payer: 59 | Admitting: Internal Medicine

## 2017-04-19 ENCOUNTER — Encounter: Payer: Self-pay | Admitting: Internal Medicine

## 2017-04-19 VITALS — BP 124/78 | HR 76 | Ht 63.0 in | Wt 270.0 lb

## 2017-04-19 DIAGNOSIS — G4733 Obstructive sleep apnea (adult) (pediatric): Secondary | ICD-10-CM

## 2017-04-19 DIAGNOSIS — R911 Solitary pulmonary nodule: Secondary | ICD-10-CM

## 2017-04-19 DIAGNOSIS — Z720 Tobacco use: Secondary | ICD-10-CM

## 2017-04-19 NOTE — Assessment & Plan Note (Signed)
Control is good and she is comfortable with her pressure auto 5-20, but she had dropped off usage and needs help rebuilding appropriate habit.  We discussed goals, comfort, medical concerns and importance of resuming CPAP.  We did also discuss oral appliances as alternative again although I would feel better with CPAP control because of her weight and AHI score.

## 2017-04-19 NOTE — Assessment & Plan Note (Signed)
It looks as if there is probably calcium in this so it is probably a scar.  She is high risk because of her smoking habit.  We are scheduling a follow-up chest CT per radiology recommendation.

## 2017-04-19 NOTE — Assessment & Plan Note (Signed)
Emphasis again on smoking cessation

## 2017-04-19 NOTE — Patient Instructions (Signed)
Order- schedule future CT chest no contrast   For September, 2019     Dx Lung nodule  Please stick with your CPAP- like glasses or a blood pressure pill, it doesn't work if you don't use it. If its uncomfortable, please let me know so we can help.  Continue CPAP auto 5-20, mask of choice, humidifier, supplies, AirView

## 2017-04-19 NOTE — Progress Notes (Signed)
HPI  Unattended Home Sleep Test 06/11/16-AHI 35.9/hour, desaturation to 84%, body weight 281 pounds Office Spirometry 12/18/16-WNL. FVC 2.34/92%, FEV1 1.97/99%, ratio 0.84, FEF 25-75% 2.48/124% ----------------------------------------------------------------------------- 12/18/16- 62 year old female smoker followed for OSA, complicated by morbid obesity, tobacco use,AAA, DM 2, HBP Unattended Home Sleep Test 06/11/16-AHI 35.9/hour, desaturation to 84%, body weight 281 pounds CPAP auto 5-20/[facility] Pending ENT Dr Suszanne Conners recently for sinus pressure. FOLLOWS FOR: DME: [facility] Pt wears CPAP nighlty for at least 6 hours.DL attached. Pt feels congested in nasal area as well for a while and hard to keep mask on and breathe thorugh her nose.  Download indicates 77% for compliance, averaging 6 hours and 15 minutes per night with AHI 1.0/hour. She is using nasal pillows mask and says pressure is very comfortable. She was sleeping better with CPAP control of nasal congestion interfered. Bothersome nasal congestion in the last couple of weeks, long after starting CPAP. Some frontal headache at times, nasal congestion with little drainage and nothing purulent. Chest feels clear despite ongoing smoking with no cough or wheeze. Office Spirometry 12/18/16-WNL. FVC 2.34/92%, FEV1 1.97/99%, ratio 0.84, FEF 25-75% 2.48/124%  04/19/17- 62 year old female smoker followed for OSA, lung nodule, tobacco abuse, complicated by morbid obesity, AAA, DM 2, HBP CPAP auto 5-20/[facility] OSA; [facility] Pt is not wearing CPAP as often as she should. DL attached.  She had stopped using CPAP regularly after hospitalized with a syncopal episode before Christmas.  Restarted in the past week.  Pressure is comfortable and download indicates good control with AHI 0.8/hour. We discussed compliance goals, medical purpose and comfort measures. Additional problem-very small right middle lobe lung nodule noted on chest CT. images reviewed. CT chest  12/22/16- 1. No acute vascular abnormality identified. No evidence for aortic dissection or aneurysm. 2. Mild mosaic attenuation pattern identified within the lungs. This is a nonspecific finding but may be seen with small airways disease. 3. Right middle lobe pulmonary nodule is identified measuring 4 mm. No follow-up needed if patient is low-risk. Non-contrast chest CT can be considered in 12 months if patient is high-risk  ROS-see HPI   + = positive Constitutional:    weight loss, night sweats, fevers, chills, + fatigue, lassitude. HEENT:    + headaches, difficulty swallowing, tooth/dental problems, sore throat,       sneezing, itching, ear ache, + nasal congestion, post nasal drip, snoring CV:    chest pain, orthopnea, PND, swelling in lower extremities, anasarca,                                                        dizziness, palpitations Resp:    shortness of breath with exertion or at rest.                productive cough,   non-productive cough, coughing up of blood.              change in color of mucus.  wheezing.   Skin:    rash or lesions. GI:  No-   heartburn, indigestion, abdominal pain, nausea, vomiting, diarrhea,                 change in bowel habits, loss of appetite GU: dysuria, change in color of urine, no urgency or frequency.   flank pain. MS:   joint pain, stiffness, decreased range  of motion, back pain. Neuro-     nothing unusual Psych:  change in mood or affect.  depression or anxiety.   memory loss.  OBJ- Physical Exam General- Alert, Oriented, Affect-appropriate, Distress- none acute, + morbidly obese Skin- rash-none, lesions- none, excoriation- none Lymphadenopathy- none Head- atraumatic            Eyes- Gross vision intact, PERRLA, conjunctivae and secretions clear            Ears- Hearing, canals-normal            Nose- + turbinate edema, no-Septal dev, mucus, polyps, erosion, perforation             Throat- Mallampati III-IV , mucosa clear , drainage-  none, tonsils- atrophic, + hoarse Neck- flexible , trachea midline, no stridor , thyroid nl, carotid no bruit Chest - symmetrical excursion , unlabored           Heart/CV- RRR , no murmur , no gallop  , no rub, nl s1 s2                           - JVD- none , edema- none, stasis changes- none, varices- none           Lung- clear to P&A, wheeze- none, cough- none , dullness-none, rub- none           Chest wall-  Abd-  Br/ Gen/ Rectal- Not done, not indicated Extrem- cyanosis- none, clubbing, none, atrophy- none, strength- nl Neuro- grossly intact to observation

## 2017-04-23 ENCOUNTER — Inpatient Hospital Stay: Admission: RE | Admit: 2017-04-23 | Payer: 59 | Source: Ambulatory Visit

## 2017-05-02 ENCOUNTER — Other Ambulatory Visit: Payer: Self-pay | Admitting: Family Medicine

## 2017-05-02 MED FILL — LOSARTAN-HCTZ 100-12.5 MG T: 100-12.5 | 30 days supply | Qty: 30 | Fill #3

## 2017-05-02 MED FILL — ATORVASTATIN 10 MG TABLET: 10 | 30 days supply | Qty: 30 | Fill #0

## 2017-05-19 DIAGNOSIS — G4733 Obstructive sleep apnea (adult) (pediatric): Secondary | ICD-10-CM | POA: Diagnosis not present

## 2017-05-28 ENCOUNTER — Other Ambulatory Visit: Payer: Self-pay | Admitting: Family Medicine

## 2017-05-28 DIAGNOSIS — Z139 Encounter for screening, unspecified: Secondary | ICD-10-CM

## 2017-06-10 ENCOUNTER — Other Ambulatory Visit: Payer: Self-pay | Admitting: Family Medicine

## 2017-06-10 MED FILL — LOSARTAN-HCTZ 100-12.5 MG T: 100-12.5 | 30 days supply | Qty: 30 | Fill #0

## 2017-06-10 MED FILL — ATORVASTATIN 10 MG TABLET: 10 | 30 days supply | Qty: 30 | Fill #1

## 2017-06-16 DIAGNOSIS — G4733 Obstructive sleep apnea (adult) (pediatric): Secondary | ICD-10-CM | POA: Diagnosis not present

## 2017-06-21 ENCOUNTER — Ambulatory Visit
Admission: RE | Admit: 2017-06-21 | Discharge: 2017-06-21 | Disposition: A | Discharge status: Home / Self Care | Payer: 59 | Source: Ambulatory Visit | Attending: Family Medicine | Admitting: Family Medicine

## 2017-06-21 DIAGNOSIS — Z139 Encounter for screening, unspecified: Secondary | ICD-10-CM

## 2017-06-21 DIAGNOSIS — Z1231 Encounter for screening mammogram for malignant neoplasm of breast: Secondary | ICD-10-CM | POA: Diagnosis not present

## 2017-07-15 MED FILL — ATORVASTATIN 10 MG TABLET: 10 | 30 days supply | Qty: 30 | Fill #2

## 2017-07-15 MED FILL — LOSARTAN-HCTZ 100-12.5 MG T: 100-12.5 | 30 days supply | Qty: 30 | Fill #1

## 2017-07-17 DIAGNOSIS — G4733 Obstructive sleep apnea (adult) (pediatric): Secondary | ICD-10-CM | POA: Diagnosis not present

## 2017-08-15 MED FILL — ATORVASTATIN 10 MG TABLET: 10 | 90 days supply | Qty: 90 | Fill #3

## 2017-08-15 MED FILL — LOSARTAN-HCTZ 100-12.5 MG T: 100-12.5 | 30 days supply | Qty: 30 | Fill #2

## 2017-08-16 DIAGNOSIS — G4733 Obstructive sleep apnea (adult) (pediatric): Secondary | ICD-10-CM | POA: Diagnosis not present

## 2017-09-05 ENCOUNTER — Other Ambulatory Visit: Payer: Self-pay

## 2017-09-05 ENCOUNTER — Encounter: Payer: Self-pay | Admitting: Family Medicine

## 2017-09-05 ENCOUNTER — Ambulatory Visit (INDEPENDENT_AMBULATORY_CARE_PROVIDER_SITE_OTHER): Payer: 59 | Admitting: Family Medicine

## 2017-09-05 VITALS — BP 132/83 | HR 78 | Temp 98.0°F | Resp 16 | Ht 63.0 in | Wt 277.5 lb

## 2017-09-05 DIAGNOSIS — E559 Vitamin D deficiency, unspecified: Secondary | ICD-10-CM | POA: Diagnosis not present

## 2017-09-05 DIAGNOSIS — Z0000 Encounter for general adult medical examination without abnormal findings: Secondary | ICD-10-CM | POA: Diagnosis not present

## 2017-09-05 DIAGNOSIS — I714 Abdominal aortic aneurysm, without rupture, unspecified: Secondary | ICD-10-CM

## 2017-09-05 DIAGNOSIS — E785 Hyperlipidemia, unspecified: Secondary | ICD-10-CM

## 2017-09-05 DIAGNOSIS — E1169 Type 2 diabetes mellitus with other specified complication: Secondary | ICD-10-CM

## 2017-09-05 LAB — CBC WITH DIFFERENTIAL/PLATELET
BASOS PCT: 1 % (ref 0.0–3.0)
Basophils Absolute: 0.1 10*3/uL (ref 0.0–0.1)
EOS ABS: 0.2 10*3/uL (ref 0.0–0.7)
Eosinophils Relative: 2.9 % (ref 0.0–5.0)
HEMATOCRIT: 39.1 % (ref 36.0–46.0)
HEMOGLOBIN: 12.7 g/dL (ref 12.0–15.0)
LYMPHS PCT: 40.3 % (ref 12.0–46.0)
Lymphs Abs: 2.6 10*3/uL (ref 0.7–4.0)
MCHC: 32.4 g/dL (ref 30.0–36.0)
MCV: 81.1 fl (ref 78.0–100.0)
MONOS PCT: 8.3 % (ref 3.0–12.0)
Monocytes Absolute: 0.5 10*3/uL (ref 0.1–1.0)
Neutro Abs: 3.1 10*3/uL (ref 1.4–7.7)
Neutrophils Relative %: 47.5 % (ref 43.0–77.0)
Platelets: 210 10*3/uL (ref 150.0–400.0)
RBC: 4.82 Mil/uL (ref 3.87–5.11)
RDW: 16.2 % — AB (ref 11.5–15.5)
WBC: 6.5 10*3/uL (ref 4.0–10.5)

## 2017-09-05 LAB — BASIC METABOLIC PANEL
BUN: 18 mg/dL (ref 6–23)
CALCIUM: 9.8 mg/dL (ref 8.4–10.5)
CO2: 30 meq/L (ref 19–32)
CREATININE: 0.95 mg/dL (ref 0.40–1.20)
Chloride: 105 mEq/L (ref 96–112)
GFR: 76.97 mL/min (ref >60.00)
Glucose, Bld: 84 mg/dL (ref 70–99)
Potassium: 4.5 mEq/L (ref 3.5–5.1)
Sodium: 143 mEq/L (ref 135–145)

## 2017-09-05 LAB — HEPATIC FUNCTION PANEL
ALT: 15 U/L (ref 0–35)
AST: 12 U/L (ref 0–37)
Albumin: 3.7 g/dL (ref 3.5–5.2)
Alkaline Phosphatase: 62 U/L (ref 39–117)
BILIRUBIN TOTAL: 0.2 mg/dL (ref 0.2–1.2)
Bilirubin, Direct: 0 mg/dL (ref 0.0–0.3)
Total Protein: 6.9 g/dL (ref 6.0–8.3)

## 2017-09-05 LAB — LIPID PANEL
CHOL/HDL RATIO: 2
Cholesterol: 102 mg/dL (ref 0–200)
HDL: 44.4 mg/dL (ref >39.00)
LDL CALC: 30 mg/dL (ref 0–99)
NONHDL: 57.4
Triglycerides: 139 mg/dL (ref 0.0–149.0)
VLDL: 27.8 mg/dL (ref 0.0–40.0)

## 2017-09-05 LAB — HEMOGLOBIN A1C: Hgb A1c MFr Bld: 6.5 % (ref 4.6–6.5)

## 2017-09-05 LAB — VITAMIN D 25 HYDROXY (VIT D DEFICIENCY, FRACTURES): VITD: 26.04 ng/mL — ABNORMAL LOW (ref 30.00–100.00)

## 2017-09-05 LAB — TSH: TSH: 1.3 u[IU]/mL (ref 0.35–4.50)

## 2017-09-05 NOTE — Assessment & Plan Note (Signed)
Chronic problem.  Pt has gained another 8 lbs.  Stressed need for healthy diet and regular exercise.  Check labs to risk stratify.  Will follow.

## 2017-09-05 NOTE — Assessment & Plan Note (Signed)
Ongoing issue.  Pt is due for AAA f/u w/ Dr Arbie Cookey in Dec 2019

## 2017-09-05 NOTE — Assessment & Plan Note (Signed)
Chronic problem.  On Lipitor w/o difficulty.  Stressed need for healthy diet and regular exercise to facilitate weight loss and improved lipids.  Check labs.  Adjust meds prn

## 2017-09-05 NOTE — Assessment & Plan Note (Signed)
Pt's PE WNL w/ exception of obesity.  UTD on mammo, colonoscopy, immunizations.  Check labs.  Anticipatory guidance provided.  

## 2017-09-05 NOTE — Progress Notes (Signed)
   Subjective:    Patient ID: Ashlee Williams, female    DOB: 20-Aug-1955, 62 y.o.   MRN: [id number]  HPI CPE- UTD on colonoscopy, mammo.  Due for eye exam.  UTD on foot exam.  On ARB for renal protection.  Pt has gained 8 lbs since last visit.  Cutting back on smoking.  Attempting to do more walking.   Review of Systems Patient reports no vision/ hearing changes, adenopathy,fever, weight change,  persistant/recurrent hoarseness , swallowing issues, chest pain, palpitations, edema, persistant/recurrent cough, hemoptysis, dyspnea (rest/exertional/paroxysmal nocturnal), gastrointestinal bleeding (melena, rectal bleeding), abdominal pain, significant heartburn, bowel changes, GU symptoms (dysuria, hematuria, incontinence), Gyn symptoms (abnormal  bleeding, pain),  syncope, focal weakness, memory loss, numbness & tingling, skin/hair/nail changes, abnormal bruising or bleeding, anxiety, or depression.     Objective:   Physical Exam General Appearance:    Alert, cooperative, no distress, appears stated age, obese  Head:    Normocephalic, without obvious abnormality, atraumatic  Eyes:    PERRL, conjunctiva/corneas clear, EOM's intact, fundi    benign, both eyes  Ears:    Normal TM's and external ear canals, both ears  Nose:   Nares normal, septum midline, mucosa normal, no drainage    or sinus tenderness  Throat:   Lips, mucosa, and tongue normal; teeth and gums normal  Neck:   Supple, symmetrical, trachea midline, no adenopathy;    Thyroid: no enlargement/tenderness/nodules  Back:     Symmetric, no curvature, ROM normal, no CVA tenderness  Lungs:     Clear to auscultation bilaterally, respirations unlabored  Chest Wall:    No tenderness or deformity   Heart:    Regular rate and rhythm, S1 and S2 normal, no murmur, rub   or gallop  Breast Exam:    Deferred to mammo  Abdomen:     Soft, non-tender, bowel sounds active all four quadrants,    no masses, no organomegaly  Genitalia:    Deferred    Rectal:    Extremities:   Extremities normal, atraumatic, no cyanosis or edema  Pulses:   2+ and symmetric all extremities  Skin:   Skin color, texture, turgor normal, no rashes or lesions  Lymph nodes:   Cervical, supraclavicular, and axillary nodes normal  Neurologic:   CNII-XII intact, normal strength, sensation and reflexes    throughout          Assessment & Plan:

## 2017-09-05 NOTE — Assessment & Plan Note (Signed)
Pt has hx of this.  Check labs and replete prn. 

## 2017-09-05 NOTE — Patient Instructions (Addendum)
Follow up in 6 months to recheck diabetes, BP, and cholesterol We'll notify you of your lab results and make any changes if needed Call Dr Nelle Don at [phone] to schedule your appt Continue to work on quitting smoking- you can do it!!! Try and make healthy food choices and get regular exercise- you can do it!! Call with any questions or concerns Have a great summer!!

## 2017-09-06 ENCOUNTER — Other Ambulatory Visit: Payer: Self-pay | Admitting: General Practice

## 2017-09-06 MED ORDER — VITAMIN D (ERGOCALCIFEROL) 1.25 MG (50000 UNIT) PO CAPS: 50000.0000 [IU] | ORAL_CAPSULE | ORAL | 0 refills | Status: DC

## 2017-09-18 MED FILL — LOSARTAN-HCTZ 100-12.5 MG T: 100-12.5 | 30 days supply | Qty: 30 | Fill #3

## 2017-09-19 MED FILL — VIT D2 1.25 MG (50,000 UNIT: 1.25 MG | 84 days supply | Qty: 12 | Fill #0

## 2017-10-17 ENCOUNTER — Ambulatory Visit: Payer: 59 | Admitting: Internal Medicine

## 2017-10-17 ENCOUNTER — Other Ambulatory Visit: Payer: Self-pay | Admitting: Family Medicine

## 2017-10-17 MED FILL — LOSARTAN-HCTZ 100-12.5 MG T: 100-12.5 | 30 days supply | Qty: 30 | Fill #0

## 2017-10-23 ENCOUNTER — Other Ambulatory Visit: Payer: 59

## 2017-10-23 ENCOUNTER — Other Ambulatory Visit: Payer: Self-pay | Admitting: Internal Medicine

## 2017-10-23 DIAGNOSIS — M79661 Pain in right lower leg: Secondary | ICD-10-CM

## 2017-10-23 DIAGNOSIS — M79662 Pain in left lower leg: Secondary | ICD-10-CM | POA: Diagnosis not present

## 2017-10-24 ENCOUNTER — Ambulatory Visit: Payer: 59 | Admitting: Physician Assistant

## 2017-10-24 ENCOUNTER — Other Ambulatory Visit: Payer: Self-pay

## 2017-10-24 ENCOUNTER — Encounter: Payer: Self-pay | Admitting: Physician Assistant

## 2017-10-24 VITALS — BP 138/90 | HR 73 | Temp 98.1°F | Resp 16 | Ht 63.0 in | Wt 280.0 lb

## 2017-10-24 DIAGNOSIS — M5442 Lumbago with sciatica, left side: Secondary | ICD-10-CM

## 2017-10-24 LAB — D-DIMER, QUANTITATIVE (NOT AT ARMC): D DIMER QUANT: 0.48 ug{FEU}/mL (ref <0.50)

## 2017-10-24 MED ORDER — METHYLPREDNISOLONE ACETATE 80 MG/ML IJ SUSP
80.0000 mg | Freq: Once | INTRAMUSCULAR | Status: AC
  Administered 2017-10-24: 80 mg via INTRAMUSCULAR

## 2017-10-24 MED ORDER — METHYLPREDNISOLONE 4 MG PO TBPK: ORAL_TABLET | ORAL | 0 refills | Status: DC

## 2017-10-24 MED ORDER — CYCLOBENZAPRINE HCL 5 MG PO TABS: 5.0000 mg | ORAL_TABLET | Freq: Every day | ORAL | 1 refills | Status: AC

## 2017-10-24 MED FILL — METHYLPREDNISOLONE 4 MG TAB: 4 | 6 days supply | Qty: 21 | Fill #0

## 2017-10-24 MED FILL — CYCLOBENZAPRINE HCL 5 MG TA: 5 | 10 days supply | Qty: 10 | Fill #0

## 2017-10-24 NOTE — Patient Instructions (Signed)
Please avoid heavy lifting or overexertion. The steroid given today should help with pain and inflammation. Start the steroid pack starting tomorrow.  Use the muscle relaxant in the evening.   Tylenol for breakthrough pain.   Follow-up if symptoms are not resolving by next Monday at the latest.  Return immediately if anything worsens of if new symptoms develop.

## 2017-10-24 NOTE — Addendum Note (Signed)
Addended by: Con MemosMOORE, Katelind Pytel S on: 10/24/2017 09:38 AM   Modules accepted: Orders

## 2017-10-24 NOTE — Progress Notes (Signed)
Patient presents to clinic today c/o left-sided low back pain x 2 days with radiation into LLE.  Denies any known trauma or injury. Denies history of similar symptoms. Notes riding on a bus a couple of days ago all day that could be contributing to symptoms. Denies numbness or weakness of lower extremities. Denies change to bowel/bladder habits or saddle anesthesia. Has not taken anything today for symptoms. Pain 9/10 presently.  Past Medical History:  Diagnosis Date  . AAA (abdominal aortic aneurysm) (HCC)   . Allergy   . Anemia   . Asthma   . Blood in stool   . Bronchitis   . Chicken pox   . Chronic headaches   . Diabetes mellitus    PT. IS TRYING TO CONTROL WITH DIET BUT AS NOTED WEIGHT IS UP  . Hypertension   . Obesity   . Syncope and collapse 12/22/2016  . Tobacco abuse     Current Outpatient Medications on File Prior to Visit  Medication Sig Dispense Refill  . amLODipine (NORVASC) 10 MG tablet Take 1 tablet (10 mg total) by mouth daily. 30 tablet 0  . APAP-Isometheptene-Dichloral (MIDRIN) 325-65-100 MG CAPS Take 1 capsule by mouth every 6 (six) hours as needed for headache.     Marland Kitchen atorvastatin (LIPITOR) 10 MG tablet TAKE 1 TABLET BY MOUTH DAILY. 30 tablet 6  . Azelastine-Fluticasone (DYMISTA) 137-50 MCG/ACT SUSP Place 2 sprays into both nostrils at bedtime. 1 Bottle 0  . butalbital-acetaminophen-caffeine (FIORICET, ESGIC) 50-325-40 MG tablet Take 1-2 tablets by mouth every 6 (six) hours as needed for headache. 20 tablet 0  . calcium-vitamin D (CALCIUM 500/D) 500-200 MG-UNIT per tablet Take 1 tablet by mouth.    . cetirizine (ZYRTEC) 10 MG tablet Take 10 mg by mouth daily.    . Hyprom-Naphaz-Polysorb-Zn Sulf (CLEAR EYES COMPLETE) SOLN Place 1 drop into both eyes daily as needed (dry eyes).    Marland Kitchen ipratropium (ATROVENT) 0.06 % nasal spray Place 2 sprays into both nostrils 4 (four) times daily. 15 mL 0  . losartan-hydrochlorothiazide (HYZAAR) 100-12.5 MG tablet TAKE 1 TABLET BY  MOUTH DAILY. 30 tablet 3  . Multiple Vitamins-Minerals (MULTIVITAMIN WITH MINERALS) tablet Take 1 tablet by mouth daily.    . nicotine polacrilex (COMMIT) 2 MG lozenge Take 2 mg by mouth as needed for smoking cessation.    . ondansetron (ZOFRAN ODT) 4 MG disintegrating tablet Take 1 tablet (4 mg total) by mouth every 8 (eight) hours as needed for nausea or vomiting. 20 tablet 0  . VENTOLIN HFA 108 (90 Base) MCG/ACT inhaler INHALE 2 PUFFS INTO THE LUNGS EVERY 4 HOURS AS NEEDED FOR WHEEZING OR SHORTNESS OF BREATH. 18 g 6  . Vitamin D, Ergocalciferol, (DRISDOL) 50000 units CAPS capsule Take 1 capsule (50,000 Units total) by mouth every 7 (seven) days. 12 capsule 0  . zolpidem (AMBIEN) 5 MG tablet Take 1 tablet (5 mg total) by mouth at bedtime as needed for sleep. 30 tablet 1   No current facility-administered medications on file prior to visit.     No Known Allergies  Family History  Problem Relation Age of Onset  . Hypertension Mother   . Stroke Mother   . Hypertension Father   . Diabetes Sister   . Hypertension Sister   . Cancer Maternal Aunt   . Hypertension Maternal Aunt   . Stroke Maternal Grandmother   . Hypertension Maternal Grandmother   . Colon cancer Maternal Grandmother 41  . Cancer Maternal Grandfather   .  Hypertension Maternal Grandfather   . Hypertension Brother   . Hypertension Maternal Uncle   . Hypertension Paternal Aunt   . Hypertension Paternal Uncle   . Hypertension Paternal Grandmother   . Hypertension Paternal Grandfather     Social History   Socioeconomic History  . Marital status: Single    Spouse name: Not on file  . Number of children: Not on file  . Years of education: Not on file  . Highest education level: Not on file  Occupational History  . Not on file  Social Needs  . Financial resource strain: Not on file  . Food insecurity:    Worry: Not on file    Inability: Not on file  . Transportation needs:    Medical: Not on file    Non-medical:  Not on file  Tobacco Use  . Smoking status: Current Every Day Smoker    Packs/day: 0.50    Years: 10.00    Pack years: 5.00    Types: Cigarettes  . Smokeless tobacco: Never Used  Substance and Sexual Activity  . Alcohol use: Yes    Alcohol/week: 0.0 oz  . Drug use: No  . Sexual activity: Yes  Lifestyle  . Physical activity:    Days per week: Not on file    Minutes per session: Not on file  . Stress: Not on file  Relationships  . Social connections:    Talks on phone: Not on file    Gets together: Not on file    Attends religious service: Not on file    Active member of club or organization: Not on file    Attends meetings of clubs or organizations: Not on file    Relationship status: Not on file  Other Topics Concern  . Not on file  Social History Narrative  . Not on file   Review of Systems - See HPI.  All other ROS are negative.  BP 138/90   Pulse 73   Temp 98.1 F (36.7 C) (Oral)   Resp 16   Ht 5\' 3"  (1.6 m)   Wt 280 lb (127 kg)   LMP 11/10/2010   SpO2 96%   BMI 49.60 kg/m   Physical Exam  Constitutional: She is oriented to person, place, and time. She appears well-developed and well-nourished.  HENT:  Head: Normocephalic and atraumatic.  Eyes: Conjunctivae are normal.  Cardiovascular: Normal rate, regular rhythm and normal heart sounds.  Pulmonary/Chest: Effort normal.  Musculoskeletal:       Left hip: Normal.       Left knee: Normal.       Lumbar back: She exhibits pain and spasm. She exhibits no tenderness, no bony tenderness and no swelling.  Neurological: She is alert and oriented to person, place, and time. No cranial nerve deficit.  Vitals reviewed.   Recent Results (from the past 2160 hour(s))  Hemoglobin A1c     Status: None   Collection Time: 09/05/17  2:01 PM  Result Value Ref Range   Hgb A1c MFr Bld 6.5 4.6 - 6.5 %    Comment: Glycemic Control Guidelines for People with Diabetes:Non Diabetic:  <6%Goal of Therapy: <7%Additional Action  Suggested:  >8%   Lipid panel     Status: None   Collection Time: 09/05/17  2:01 PM  Result Value Ref Range   Cholesterol 102 0 - 200 mg/dL    Comment: ATP III Classification       Desirable:  < 200 mg/dL  Borderline High:  200 - 239 mg/dL          High:  > = 865 mg/dL   Triglycerides 784.6 0.0 - 149.0 mg/dL    Comment: Normal:  <962 mg/dLBorderline High:  150 - 199 mg/dL   HDL 95.28 >41.32 mg/dL   VLDL 44.0 0.0 - 10.2 mg/dL   LDL Cholesterol 30 0 - 99 mg/dL   Total CHOL/HDL Ratio 2     Comment:                Men          Women1/2 Average Risk     3.4          3.3Average Risk          5.0          4.42X Average Risk          9.6          7.13X Average Risk          15.0          11.0                       NonHDL 57.40     Comment: NOTE:  Non-HDL goal should be 30 mg/dL higher than patient's LDL goal (i.e. LDL goal of < 70 mg/dL, would have non-HDL goal of < 100 mg/dL)  Basic metabolic panel     Status: None   Collection Time: 09/05/17  2:01 PM  Result Value Ref Range   Sodium 143 135 - 145 mEq/L   Potassium 4.5 3.5 - 5.1 mEq/L   Chloride 105 96 - 112 mEq/L   CO2 30 19 - 32 mEq/L   Glucose, Bld 84 70 - 99 mg/dL   BUN 18 6 - 23 mg/dL   Creatinine, Ser 7.25 0.40 - 1.20 mg/dL   Calcium 9.8 8.4 - 36.6 mg/dL   GFR 44.03 >47.42 mL/min  TSH     Status: None   Collection Time: 09/05/17  2:01 PM  Result Value Ref Range   TSH 1.30 0.35 - 4.50 uIU/mL  Hepatic function panel     Status: None   Collection Time: 09/05/17  2:01 PM  Result Value Ref Range   Total Bilirubin 0.2 0.2 - 1.2 mg/dL   Bilirubin, Direct 0.0 0.0 - 0.3 mg/dL   Alkaline Phosphatase 62 39 - 117 U/L   AST 12 0 - 37 U/L   ALT 15 0 - 35 U/L   Total Protein 6.9 6.0 - 8.3 g/dL   Albumin 3.7 3.5 - 5.2 g/dL  CBC with Differential/Platelet     Status: Abnormal   Collection Time: 09/05/17  2:01 PM  Result Value Ref Range   WBC 6.5 4.0 - 10.5 K/uL   RBC 4.82 3.87 - 5.11 Mil/uL   Hemoglobin 12.7 12.0 - 15.0 g/dL     HCT 59.5 63.8 - 75.6 %   MCV 81.1 78.0 - 100.0 fl   MCHC 32.4 30.0 - 36.0 g/dL   RDW 43.3 (H) 29.5 - 18.8 %   Platelets 210.0 150.0 - 400.0 K/uL   Neutrophils Relative % 47.5 43.0 - 77.0 %   Lymphocytes Relative 40.3 12.0 - 46.0 %   Monocytes Relative 8.3 3.0 - 12.0 %   Eosinophils Relative 2.9 0.0 - 5.0 %   Basophils Relative 1.0 0.0 - 3.0 %   Neutro Abs 3.1 1.4 - 7.7 K/uL   Lymphs Abs 2.6  0.7 - 4.0 K/uL   Monocytes Absolute 0.5 0.1 - 1.0 K/uL   Eosinophils Absolute 0.2 0.0 - 0.7 K/uL   Basophils Absolute 0.1 0.0 - 0.1 K/uL  VITAMIN D 25 Hydroxy (Vit-D Deficiency, Fractures)     Status: Abnormal   Collection Time: 09/05/17  2:01 PM  Result Value Ref Range   VITD 26.04 (L) 30.00 - 100.00 ng/mL  D-Dimer, Quantitative     Status: None   Collection Time: 10/23/17  3:24 PM  Result Value Ref Range   D-Dimer, Quant 0.48 <0.50 mcg/mL FEU    Comment: . The D-Dimer test is used frequently to exclude an acute PE or DVT. In patients with a low to moderate clinical risk assessment and a D-Dimer result <0.50 mcg/mL FEU, the likelihood of a PE or DVT is very low. However, a thromboembolic event should not be excluded solely on the basis of the D-Dimer level. Increased levels of D-Dimer are associated with a PE, DVT, DIC, malignancies, inflammation, sepsis, surgery, trauma, pregnancy, and advancing patient age. [Jama 2006 11:295(2):199-207] . For additional information, please refer to: http://education.[facility].com/faq/FAQ149 (This link is being provided for informational/ educational purposes only) .     Assessment/Plan: 1. Acute left-sided low back pain with left-sided sciatica 2 days of symptoms. Not alleviated significantly by Advil. IM Depomedrol given today. Start medrol dose pack tomorrow. Rx Flexeril. Supportive measures and OTC medications reviewed. Follow-up if not resolving.   - methylPREDNISolone (MEDROL DOSEPAK) 4 MG TBPK tablet; Take following package  directions.  Dispense: 21 tablet; Refill: 0 - cyclobenzaprine (FLEXERIL) 5 MG tablet; Take 1 tablet (5 mg total) by mouth at bedtime.  Dispense: 10 tablet; Refill: 1   Piedad ClimesWilliam Cody Christerpher Clos, [place] JerseyPA-C

## 2017-10-28 ENCOUNTER — Encounter: Payer: Self-pay | Admitting: Family Medicine

## 2017-10-28 DIAGNOSIS — M7989 Other specified soft tissue disorders: Secondary | ICD-10-CM

## 2017-10-30 ENCOUNTER — Other Ambulatory Visit: Payer: Self-pay | Admitting: Physician Assistant

## 2017-10-30 DIAGNOSIS — M7989 Other specified soft tissue disorders: Secondary | ICD-10-CM

## 2017-10-31 ENCOUNTER — Ambulatory Visit (HOSPITAL_COMMUNITY)
Admission: RE | Admit: 2017-10-31 | Discharge: 2017-10-31 | Disposition: A | Discharge status: Home / Self Care | Payer: 59 | Source: Ambulatory Visit | Attending: Physician Assistant | Admitting: Physician Assistant

## 2017-10-31 ENCOUNTER — Telehealth: Payer: Self-pay | Admitting: Physician Assistant

## 2017-10-31 DIAGNOSIS — M7989 Other specified soft tissue disorders: Secondary | ICD-10-CM | POA: Insufficient documentation

## 2017-10-31 NOTE — Telephone Encounter (Signed)
[facility]  Left leg is negative for DVT 10/31/2017 @ 11:08 Read back and confirmed

## 2017-10-31 NOTE — Telephone Encounter (Signed)
Per DPR, okay to leave VM on cell phone.  Detailed message was left with results.  CRM created so that if patient calls back, okay for PEC to disclose information.

## 2017-10-31 NOTE — Progress Notes (Signed)
LLE venous duplex prelim: negative for DVT.  Farrel DemarkJill Eunice, RDMS, RVT     Called results to Tiffany.

## 2017-10-31 NOTE — Telephone Encounter (Signed)
Please let patient know everything looks good!

## 2017-11-06 ENCOUNTER — Other Ambulatory Visit: Payer: 59

## 2017-11-18 MED FILL — LOSARTAN-HCTZ 100-12.5 MG T: 100-12.5 | 30 days supply | Qty: 30 | Fill #1

## 2017-11-18 MED FILL — ATORVASTATIN 10 MG TABLET: 10 | 30 days supply | Qty: 30 | Fill #4

## 2017-11-18 MED FILL — CYCLOBENZAPRINE HCL 5 MG TA: 5 | 10 days supply | Qty: 10 | Fill #1

## 2017-12-19 ENCOUNTER — Ambulatory Visit (INDEPENDENT_AMBULATORY_CARE_PROVIDER_SITE_OTHER)
Admission: RE | Admit: 2017-12-19 | Discharge: 2017-12-19 | Disposition: A | Discharge status: Home / Self Care | Payer: 59 | Source: Ambulatory Visit | Attending: Internal Medicine | Admitting: Internal Medicine

## 2017-12-19 DIAGNOSIS — R911 Solitary pulmonary nodule: Secondary | ICD-10-CM | POA: Diagnosis not present

## 2017-12-19 DIAGNOSIS — R918 Other nonspecific abnormal finding of lung field: Secondary | ICD-10-CM | POA: Diagnosis not present

## 2017-12-23 MED FILL — LOSARTAN-HCTZ 100-12.5 MG T: 100-12.5 | 30 days supply | Qty: 30 | Fill #2

## 2017-12-23 MED FILL — AZITHROMYCIN 250 MG TABLET: 250 | 5 days supply | Qty: 6 | Fill #0

## 2018-01-30 MED FILL — LOSARTAN-HCTZ 100-12.5 MG T: 100-12.5 | 30 days supply | Qty: 30 | Fill #3

## 2018-02-19 ENCOUNTER — Other Ambulatory Visit: Payer: Self-pay

## 2018-02-19 ENCOUNTER — Encounter (HOSPITAL_COMMUNITY): Payer: Self-pay

## 2018-02-19 ENCOUNTER — Emergency Department (HOSPITAL_COMMUNITY)
Admission: EM | Admit: 2018-02-19 | Discharge: 2018-02-19 | Disposition: A | Discharge status: Home / Self Care | Payer: 59 | Attending: Emergency Medicine | Admitting: Emergency Medicine

## 2018-02-19 ENCOUNTER — Emergency Department (HOSPITAL_COMMUNITY): Payer: 59

## 2018-02-19 DIAGNOSIS — S59902A Unspecified injury of left elbow, initial encounter: Secondary | ICD-10-CM | POA: Diagnosis not present

## 2018-02-19 DIAGNOSIS — Y929 Unspecified place or not applicable: Secondary | ICD-10-CM | POA: Insufficient documentation

## 2018-02-19 DIAGNOSIS — Z79899 Other long term (current) drug therapy: Secondary | ICD-10-CM | POA: Diagnosis not present

## 2018-02-19 DIAGNOSIS — M25512 Pain in left shoulder: Secondary | ICD-10-CM | POA: Diagnosis not present

## 2018-02-19 DIAGNOSIS — M545 Low back pain: Secondary | ICD-10-CM | POA: Diagnosis not present

## 2018-02-19 DIAGNOSIS — F17210 Nicotine dependence, cigarettes, uncomplicated: Secondary | ICD-10-CM | POA: Diagnosis not present

## 2018-02-19 DIAGNOSIS — S3992XA Unspecified injury of lower back, initial encounter: Secondary | ICD-10-CM | POA: Diagnosis not present

## 2018-02-19 DIAGNOSIS — Y999 Unspecified external cause status: Secondary | ICD-10-CM | POA: Diagnosis not present

## 2018-02-19 DIAGNOSIS — E119 Type 2 diabetes mellitus without complications: Secondary | ICD-10-CM | POA: Insufficient documentation

## 2018-02-19 DIAGNOSIS — M25522 Pain in left elbow: Secondary | ICD-10-CM | POA: Diagnosis not present

## 2018-02-19 DIAGNOSIS — M79602 Pain in left arm: Secondary | ICD-10-CM | POA: Diagnosis not present

## 2018-02-19 DIAGNOSIS — Y939 Activity, unspecified: Secondary | ICD-10-CM | POA: Insufficient documentation

## 2018-02-19 DIAGNOSIS — R52 Pain, unspecified: Secondary | ICD-10-CM | POA: Diagnosis not present

## 2018-02-19 DIAGNOSIS — S4992XA Unspecified injury of left shoulder and upper arm, initial encounter: Secondary | ICD-10-CM | POA: Diagnosis not present

## 2018-02-19 DIAGNOSIS — I10 Essential (primary) hypertension: Secondary | ICD-10-CM | POA: Insufficient documentation

## 2018-02-19 MED ORDER — METHOCARBAMOL 500 MG PO TABS
500.0000 mg | ORAL_TABLET | Freq: Once | ORAL | Status: AC
  Administered 2018-02-19: 500 mg via ORAL
  Filled 2018-02-19: qty 1

## 2018-02-19 MED ORDER — METHOCARBAMOL 500 MG PO TABS: 500.0000 mg | ORAL_TABLET | Freq: Three times a day (TID) | ORAL | 0 refills | Status: AC | PRN
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
## 2018-02-27 DIAGNOSIS — H52203 Unspecified astigmatism, bilateral: Secondary | ICD-10-CM | POA: Diagnosis not present

## 2018-02-27 DIAGNOSIS — H524 Presbyopia: Secondary | ICD-10-CM | POA: Diagnosis not present

## 2018-02-27 LAB — HM DIABETES EYE EXAM

## 2018-03-04 ENCOUNTER — Other Ambulatory Visit: Payer: Self-pay | Admitting: Family Medicine

## 2018-03-04 MED FILL — LOSARTAN-HCTZ 100-12.5 MG T: 100-12.5 | 30 days supply | Qty: 30 | Fill #0

## 2018-03-05 ENCOUNTER — Encounter: Payer: Self-pay | Admitting: General Practice

## 2018-03-18 ENCOUNTER — Other Ambulatory Visit: Payer: Self-pay

## 2018-03-18 ENCOUNTER — Ambulatory Visit: Payer: 59 | Admitting: Vascular Surgery

## 2018-03-18 ENCOUNTER — Ambulatory Visit (HOSPITAL_COMMUNITY)
Admission: RE | Admit: 2018-03-18 | Discharge: 2018-03-18 | Disposition: A | Discharge status: Home / Self Care | Payer: 59 | Source: Ambulatory Visit | Attending: Vascular Surgery | Admitting: Vascular Surgery

## 2018-03-18 ENCOUNTER — Encounter: Payer: Self-pay | Admitting: Vascular Surgery

## 2018-03-18 VITALS — BP 129/87 | HR 69 | Temp 97.1°F | Resp 16 | Ht 63.0 in | Wt 269.0 lb

## 2018-03-18 DIAGNOSIS — I714 Abdominal aortic aneurysm, without rupture, unspecified: Secondary | ICD-10-CM

## 2018-03-18 NOTE — Progress Notes (Signed)
[facility]  Patient name: Ashlee Williams MRN: [id number] DOB: 12/23/1955 Sex: female  REASON FOR VISIT: Follow-up of known small abdominal aortic aneurysm  HPI: Ashlee Williams is a 62 y.o. female here today for follow-up.  She had discovery of this around 2016 and is been seen with serial ultrasounds.  Last ultrasound was 2 years ago.  She has no symptoms referable to her aneurysm.  Denies any cardiac difficulty.  No evidence of peripheral vascular occlusive disease and no carotid artery symptoms  Past Medical History:  Diagnosis Date  . AAA (abdominal aortic aneurysm) (HCC)   . Allergy   . Anemia   . Asthma   . Blood in stool   . Bronchitis   . Chicken pox   . Chronic headaches   . Diabetes mellitus    PT. IS TRYING TO CONTROL WITH DIET BUT AS NOTED WEIGHT IS UP  . Hypertension   . Obesity   . Syncope and collapse 12/22/2016  . Tobacco abuse     Family History  Problem Relation Age of Onset  . Hypertension Mother   . Stroke Mother   . Hypertension Father   . Diabetes Sister   . Hypertension Sister   . Cancer Maternal Aunt   . Hypertension Maternal Aunt   . Stroke Maternal Grandmother   . Hypertension Maternal Grandmother   . Colon cancer Maternal Grandmother 76  . Cancer Maternal Grandfather   . Hypertension Maternal Grandfather   . Hypertension Brother   . Hypertension Maternal Uncle   . Hypertension Paternal Aunt   . Hypertension Paternal Uncle   . Hypertension Paternal Grandmother   . Hypertension Paternal Grandfather     SOCIAL HISTORY: Social History   Tobacco Use  . Smoking status: Current Every Day Smoker    Packs/day: 0.50    Years: 10.00    Pack years: 5.00    Types: Cigarettes  . Smokeless tobacco: Never Used  Substance Use Topics  . Alcohol use: Yes    Alcohol/week: 0.0 standard drinks    No Known Allergies  Current Outpatient Medications  Medication Sig Dispense  Refill  . amLODipine (NORVASC) 10 MG tablet Take 1 tablet (10 mg total) by mouth daily. 30 tablet 0  . APAP-Isometheptene-Dichloral (MIDRIN) 325-65-100 MG CAPS Take 1 capsule by mouth every 6 (six) hours as needed for headache.     Marland Kitchen atorvastatin (LIPITOR) 10 MG tablet TAKE 1 TABLET BY MOUTH DAILY. 30 tablet 6  . Azelastine-Fluticasone (DYMISTA) 137-50 MCG/ACT SUSP Place 2 sprays into both nostrils at bedtime. 1 Bottle 0  . calcium-vitamin D (CALCIUM 500/D) 500-200 MG-UNIT per tablet Take 1 tablet by mouth.    . cetirizine (ZYRTEC) 10 MG tablet Take 10 mg by mouth daily.    . cyclobenzaprine (FLEXERIL) 5 MG tablet Take 1 tablet (5 mg total) by mouth at bedtime. 10 tablet 1  . ipratropium (ATROVENT) 0.06 % nasal spray Place 2 sprays into both nostrils 4 (four) times daily. 15 mL 0  . losartan-hydrochlorothiazide (HYZAAR) 100-12.5 MG tablet TAKE 1 TABLET BY MOUTH DAILY. 30 tablet 0  . methocarbamol (ROBAXIN) 500 MG tablet Take 1 tablet (500 mg total) by mouth every 8 (eight) hours as needed. 30 tablet 0  . methylPREDNISolone (MEDROL DOSEPAK) 4 MG TBPK tablet Take following package directions. 21 tablet 0  . Multiple Vitamins-Minerals (MULTIVITAMIN WITH MINERALS) tablet Take 1 tablet by mouth daily.    . nicotine polacrilex (COMMIT) 2 MG  lozenge Take 2 mg by mouth as needed for smoking cessation.    . ondansetron (ZOFRAN ODT) 4 MG disintegrating tablet Take 1 tablet (4 mg total) by mouth every 8 (eight) hours as needed for nausea or vomiting. 20 tablet 0  . VENTOLIN HFA 108 (90 Base) MCG/ACT inhaler INHALE 2 PUFFS INTO THE LUNGS EVERY 4 HOURS AS NEEDED FOR WHEEZING OR SHORTNESS OF BREATH. 18 g 6  . Vitamin D, Ergocalciferol, (DRISDOL) 50000 units CAPS capsule Take 1 capsule (50,000 Units total) by mouth every 7 (seven) days. 12 capsule 0  . zolpidem (AMBIEN) 5 MG tablet Take 1 tablet (5 mg total) by mouth at bedtime as needed for sleep. 30 tablet 1  . Hyprom-Naphaz-Polysorb-Zn Sulf (CLEAR EYES  COMPLETE) SOLN Place 1 drop into both eyes daily as needed (dry eyes).     No current facility-administered medications for this visit.     REVIEW OF SYSTEMS:  [X]  denotes positive finding, [ ]  denotes negative finding Cardiac  Comments:  Chest pain or chest pressure:    Shortness of breath upon exertion: x   Short of breath when lying flat:    Irregular heart rhythm: x       Vascular    Pain in calf, thigh, or hip brought on by ambulation: x   Pain in feet at night that wakes you up from your sleep:     Blood clot in your veins:    Leg swelling:           PHYSICAL EXAM: Vitals:   03/18/18 0915  BP: 129/87  Pulse: 69  Resp: 16  Temp: (!) 97.1 F (36.2 C)  TempSrc: Oral  SpO2: 100%  Weight: 269 lb (122 kg)  Height: 5\' 3"  (1.6 m)    GENERAL: The patient is a well-nourished female, in no acute distress. The vital signs are documented above. CARDIOVASCULAR: Carotid arteries without bruits bilaterally.  Left radial and 2+ dorsalis pedis pulses bilaterally.  No aneurysm palpable in her abdomen PULMONARY: There is good air exchange  MUSCULOSKELETAL: There are no major deformities or cyanosis. NEUROLOGIC: No focal weakness or paresthesias are detected. SKIN: There are no ulcers or rashes noted. PSYCHIATRIC: The patient has a normal affect.  DATA:  Duplex today was somewhat limited due to obesity and bowel gas.  Maximal size of her aorta is 2.3 cm which is unchanged.  MEDICAL ISSUES: Again discussed this findings with the patient.  I explained that she does have a very small aneurysmal change of her aorta.  Have recommended repeat duplex in 2 years.  Explained that her young age that this may be a difficulty lifelong with her at no current risk.  Will be seen again in 2 years    Larina Earthlyodd F. Early, MD [facility] Office Tel [phone] Pager [phone]

## 2018-05-13 ENCOUNTER — Encounter: Payer: Self-pay | Admitting: Internal Medicine

## 2018-05-13 ENCOUNTER — Ambulatory Visit (INDEPENDENT_AMBULATORY_CARE_PROVIDER_SITE_OTHER): Payer: 59 | Admitting: Internal Medicine

## 2018-05-13 VITALS — BP 124/62 | HR 90

## 2018-05-13 DIAGNOSIS — J101 Influenza due to other identified influenza virus with other respiratory manifestations: Secondary | ICD-10-CM | POA: Diagnosis not present

## 2018-05-13 DIAGNOSIS — J40 Bronchitis, not specified as acute or chronic: Secondary | ICD-10-CM

## 2018-05-13 DIAGNOSIS — G4733 Obstructive sleep apnea (adult) (pediatric): Secondary | ICD-10-CM

## 2018-05-13 LAB — POCT INFLUENZA A/B
Influenza A, POC: POSITIVE — AB
Influenza B, POC: NEGATIVE

## 2018-05-13 NOTE — Progress Notes (Signed)
HPI F current smoker followed for OSA, tobacco use, RML lung nodule, complicated by HBP, AAA, morbid obesity, DM2, hyperlipidemia,  Unattended Home Sleep Test 06/11/16-AHI 35.9/hour, desaturation to 84%, body weight 281 pounds CT chest 12/22/16-4 mm right middle lobe nodule Office Spirometry 12/18/16-WNL. FVC 2.34/92%, FEV1 1.97/99%, ratio 0.84, FEF 25-75% 2.48/124% Nasal flu swab POSITIVE Influenza A -------------------------------------------------------------------------------------------------  05/13/2018-63 year old female smoker followed for OSA,  tobacco use, RML lung nodule, complicated by HBP, AAA, morbid obesity, DM2, hyperlipidemia, Acute visit-sick for 4 days with fever, chills, muscle aches, chest congestion, scant sputum-white or yellow, mild tussive headache.  Denies GI upset.  Did have flu shot. Nasal flu swab POSITIVE Influenza A  Prior to Admission medications   Medication Sig Start Date End Date Taking? Authorizing Provider  amLODipine (NORVASC) 10 MG tablet Take 1 tablet (10 mg total) by mouth daily. 12/25/16   Joseph ArtVann, Jessica U, DO  APAP-Isometheptene-Dichloral (MIDRIN) 325-65-100 MG CAPS Take 1 capsule by mouth every 6 (six) hours as needed for headache.     [provider]  atorvastatin (LIPITOR) 10 MG tablet TAKE 1 TABLET BY MOUTH DAILY. 05/02/17   Sheliah Hatchabori, Katherine E, MD  Azelastine-Fluticasone [facility](DYMISTA) 137-50 MCG/ACT SUSP Place 2 sprays into both nostrils at bedtime. 12/18/16   Jetty DuhamelYoung,  D, MD  calcium-vitamin D (CALCIUM 500/D) 500-200 MG-UNIT per tablet Take 1 tablet by mouth.    [provider]  cetirizine (ZYRTEC) 10 MG tablet Take 10 mg by mouth daily.    [provider]  cyclobenzaprine (FLEXERIL) 5 MG tablet Take 1 tablet (5 mg total) by mouth at bedtime. 10/24/17   Waldon MerlMartin, William C, PA-C  Hyprom-Naphaz-Polysorb-Zn Sulf (CLEAR EYES COMPLETE) SOLN Place 1 drop into both eyes daily as needed (dry eyes).    [provider]   ipratropium (ATROVENT) 0.06 % nasal spray Place 2 sprays into both nostrils 4 (four) times daily. 11/29/16   Deatra Canterxford, William J, FNP  losartan-hydrochlorothiazide (HYZAAR) 100-12.5 MG tablet TAKE 1 TABLET BY MOUTH DAILY. 03/04/18   Willow OraAndy, Camille L, MD  methocarbamol (ROBAXIN) 500 MG tablet Take 1 tablet (500 mg total) by mouth every 8 (eight) hours as needed. 02/19/18   Petrucelli, Samantha R, PA-C  methylPREDNISolone (MEDROL DOSEPAK) 4 MG TBPK tablet Take following package directions. 10/24/17   Waldon MerlMartin, William C, PA-C  Multiple Vitamins-Minerals (MULTIVITAMIN WITH MINERALS) tablet Take 1 tablet by mouth daily.    [provider]  nicotine polacrilex (COMMIT) 2 MG lozenge Take 2 mg by mouth as needed for smoking cessation.    [provider]  ondansetron (ZOFRAN ODT) 4 MG disintegrating tablet Take 1 tablet (4 mg total) by mouth every 8 (eight) hours as needed for nausea or vomiting. 11/16/14   Teressa LowerPickering, Vrinda, NP  VENTOLIN HFA 108 (90 Base) MCG/ACT inhaler INHALE 2 PUFFS INTO THE LUNGS EVERY 4 HOURS AS NEEDED FOR WHEEZING OR SHORTNESS OF BREATH. 02/07/16   Sheliah Hatchabori, Katherine E, MD  Vitamin D, Ergocalciferol, (DRISDOL) 50000 units CAPS capsule Take 1 capsule (50,000 Units total) by mouth every 7 (seven) days. 09/06/17   Sheliah Hatchabori, Katherine E, MD  zolpidem (AMBIEN) 5 MG tablet Take 1 tablet (5 mg total) by mouth at bedtime as needed for sleep. 11/01/16   Waymon BudgeYoung,  D, MD   Past Medical History:  Diagnosis Date  . AAA (abdominal aortic aneurysm) (HCC)   . Allergy   . Anemia   . Asthma   . Blood in stool   . Bronchitis   . Chicken pox   .  Chronic headaches   . Diabetes mellitus    PT. IS TRYING TO CONTROL WITH DIET BUT AS NOTED WEIGHT IS UP  . Hypertension   . Obesity   . Syncope and collapse 12/22/2016  . Tobacco abuse    Past Surgical History:  Procedure Laterality Date  . ABDOMINAL HYSTERECTOMY  1998  . CHOLECYSTECTOMY N/A 03/16/2015   Procedure: LAPAROSCOPIC  CHOLECYSTECTOMY;  Surgeon: Jimmye Norman, MD;  Location: [facility] OR;  Service: General;  Laterality: N/A;   Family History  Problem Relation Age of Onset  . Hypertension Mother   . Stroke Mother   . Hypertension Father   . Diabetes Sister   . Hypertension Sister   . Cancer Maternal Aunt   . Hypertension Maternal Aunt   . Stroke Maternal Grandmother   . Hypertension Maternal Grandmother   . Colon cancer Maternal Grandmother 35  . Cancer Maternal Grandfather   . Hypertension Maternal Grandfather   . Hypertension Brother   . Hypertension Maternal Uncle   . Hypertension Paternal Aunt   . Hypertension Paternal Uncle   . Hypertension Paternal Grandmother   . Hypertension Paternal Grandfather    Social History   Socioeconomic History  . Marital status: Single    Spouse name: Not on file  . Number of children: Not on file  . Years of education: Not on file  . Highest education level: Not on file  Occupational History  . Not on file  Social Needs  . Financial resource strain: Not on file  . Food insecurity:    Worry: Not on file    Inability: Not on file  . Transportation needs:    Medical: Not on file    Non-medical: Not on file  Tobacco Use  . Smoking status: Current Every Day Smoker    Packs/day: 0.50    Years: 10.00    Pack years: 5.00    Types: Cigarettes  . Smokeless tobacco: Never Used  Substance and Sexual Activity  . Alcohol use: Yes    Alcohol/week: 0.0 standard drinks  . Drug use: No  . Sexual activity: Yes  Lifestyle  . Physical activity:    Days per week: Not on file    Minutes per session: Not on file  . Stress: Not on file  Relationships  . Social connections:    Talks on phone: Not on file    Gets together: Not on file    Attends religious service: Not on file    Active member of club or organization: Not on file    Attends meetings of clubs or organizations: Not on file    Relationship status: Not on file  . Intimate partner violence:    Fear of  current or ex partner: Not on file    Emotionally abused: Not on file    Physically abused: Not on file    Forced sexual activity: Not on file  Other Topics Concern  . Not on file  Social History Narrative  . Not on file   ROS-see HPI   + = positive Constitutional:    weight loss, +night sweats,+ fevers, +chills, fatigue, lassitude. HEENT:   + headaches, difficulty swallowing, tooth/dental problems, sore throat,       sneezing, itching, ear ache, nasal congestion, post nasal drip, snoring CV:    chest pain, orthopnea, PND, swelling in lower extremities, anasarca,  dizziness, palpitations Resp:   +shortness of breath with exertion or at rest.                productive cough,   +non-productive cough, coughing up of blood.              change in color of mucus.  wheezing.   Skin:    rash or lesions. GI:  No-   heartburn, indigestion, abdominal pain, nausea, vomiting, diarrhea,                 change in bowel habits, loss of appetite GU: dysuria, change in color of urine, no urgency or frequency.   flank pain. MS:   joint pain, stiffness, decreased range of motion, back pain. Neuro-     nothing unusual Psych:  change in mood or affect.  depression or anxiety.   memory loss.  OBJ- Physical Exam General- Alert, Oriented, Affect-appropriate, Distress + ill, + morbid obesity Skin- rash-none, lesions- none, excoriation- none,  + diaphoretic Lymphadenopathy- none Head- atraumatic            Eyes- Gross vision intact, PERRLA, conjunctivae and secretions clear            Ears- Hearing, canals-normal            Nose- Clear, no-Septal dev, mucus, polyps, erosion, perforation             Throat- Mallampati II , mucosa clear , drainage- none, tonsils- atrophic Neck- flexible , trachea midline, no stridor , thyroid nl, carotid no bruit Chest - symmetrical excursion , unlabored           Heart/CV- RRR , no murmur , no gallop  , no rub, nl s1 s2                            - JVD- none , edema- none, stasis changes- none, varices- none           Lung- distant- no rhonchi, wheeze- none, cough+, dullness-none, rub- none           Chest wall-  Abd-  Br/ Gen/ Rectal- Not done, not indicated Extrem- cyanosis- none, clubbing, none, atrophy- none, strength- nl Neuro- grossly intact to observation

## 2018-05-13 NOTE — Patient Instructions (Signed)
Plan- home wearing mask, for rest, fluids, comfort meds like Theraflu or Tylenol Flu andCold as needed. Neb xop 0.63 given. No work till illness resolved.

## 2018-05-13 NOTE — Assessment & Plan Note (Signed)
Acute bronchitis syndrome strongly consistent with influenza despite having had flu shot this fall.  Nasal swab positive for influenza A.   Now 4 days into course, past likely benefit range of Tamiflu. Plan-neb treatment Xopenex.  Home, fluids, symptomatic therapy.

## 2018-05-13 NOTE — Assessment & Plan Note (Signed)
CPAP auto 5-20/ [employer]

## 2018-05-14 ENCOUNTER — Telehealth: Payer: Self-pay | Admitting: Internal Medicine

## 2018-05-14 ENCOUNTER — Other Ambulatory Visit: Payer: Self-pay | Admitting: Internal Medicine

## 2018-05-14 MED ORDER — PREDNISONE 20 MG PO TABS: ORAL_TABLET | ORAL | 0 refills | Status: DC

## 2018-05-14 MED ORDER — PROMETHAZINE-CODEINE 6.25-10 MG/5ML PO SYRP: 5.0000 mL | ORAL_SOLUTION | Freq: Four times a day (QID) | ORAL | 0 refills | Status: DC | PRN

## 2018-05-14 MED FILL — predniSONE 20 MG TABS: 20 | 5 days supply | Qty: 5 | Fill #0

## 2018-05-14 MED FILL — PROMETHAZINE W/COD SYRUP: 6.25-10 | 10 days supply | Qty: 200 | Fill #0

## 2018-05-14 NOTE — Telephone Encounter (Signed)
KW passed on message that SD is wheezing, coughing hard. I have sent prednisone and codeine cough syrup to [facility] pharmacy. LOA advised through rest of this week.

## 2018-07-07 ENCOUNTER — Other Ambulatory Visit: Payer: Self-pay | Admitting: Family Medicine

## 2018-07-08 NOTE — Telephone Encounter (Signed)
LMOVM for patient to schedule appt

## 2018-07-10 ENCOUNTER — Encounter: Payer: Self-pay | Admitting: Emergency Medicine

## 2018-07-10 NOTE — Telephone Encounter (Signed)
My chart message sent to patient to schedule a virtual visit

## 2018-07-11 MED ORDER — LOSARTAN POTASSIUM-HCTZ 100-12.5 MG PO TABS: 1.0000 | ORAL_TABLET | Freq: Every day | ORAL | 0 refills | Status: DC

## 2018-07-11 MED FILL — LOSARTAN POTASSIUM 100 MG T: 100 | 30 days supply | Qty: 30 | Fill #0

## 2018-07-11 MED FILL — HYDROCHLOROTHIAZIDE 12.5 MG: 12.5 | 30 days supply | Qty: 30 | Fill #0

## 2018-07-23 ENCOUNTER — Other Ambulatory Visit: Payer: Self-pay | Admitting: Family Medicine

## 2018-07-23 DIAGNOSIS — Z1231 Encounter for screening mammogram for malignant neoplasm of breast: Secondary | ICD-10-CM

## 2018-08-12 ENCOUNTER — Other Ambulatory Visit: Payer: Self-pay

## 2018-08-12 ENCOUNTER — Emergency Department (HOSPITAL_COMMUNITY)
Admission: EM | Admit: 2018-08-12 | Discharge: 2018-08-12 | Disposition: A | Discharge status: Home / Self Care | Payer: PRIVATE HEALTH INSURANCE | Attending: Emergency Medicine | Admitting: Emergency Medicine

## 2018-08-12 ENCOUNTER — Emergency Department (HOSPITAL_COMMUNITY): Payer: PRIVATE HEALTH INSURANCE

## 2018-08-12 ENCOUNTER — Encounter (HOSPITAL_COMMUNITY): Payer: Self-pay | Admitting: Emergency Medicine

## 2018-08-12 DIAGNOSIS — S8002XA Contusion of left knee, initial encounter: Secondary | ICD-10-CM | POA: Diagnosis not present

## 2018-08-12 DIAGNOSIS — W108XXA Fall (on) (from) other stairs and steps, initial encounter: Secondary | ICD-10-CM | POA: Insufficient documentation

## 2018-08-12 DIAGNOSIS — E119 Type 2 diabetes mellitus without complications: Secondary | ICD-10-CM | POA: Insufficient documentation

## 2018-08-12 DIAGNOSIS — F17210 Nicotine dependence, cigarettes, uncomplicated: Secondary | ICD-10-CM | POA: Diagnosis not present

## 2018-08-12 DIAGNOSIS — Y990 Civilian activity done for income or pay: Secondary | ICD-10-CM | POA: Diagnosis not present

## 2018-08-12 DIAGNOSIS — S0990XA Unspecified injury of head, initial encounter: Secondary | ICD-10-CM | POA: Insufficient documentation

## 2018-08-12 DIAGNOSIS — Y9259 Other trade areas as the place of occurrence of the external cause: Secondary | ICD-10-CM | POA: Insufficient documentation

## 2018-08-12 DIAGNOSIS — I10 Essential (primary) hypertension: Secondary | ICD-10-CM | POA: Insufficient documentation

## 2018-08-12 DIAGNOSIS — S8000XA Contusion of unspecified knee, initial encounter: Secondary | ICD-10-CM

## 2018-08-12 DIAGNOSIS — Y9389 Activity, other specified: Secondary | ICD-10-CM | POA: Insufficient documentation

## 2018-08-12 DIAGNOSIS — M25511 Pain in right shoulder: Secondary | ICD-10-CM | POA: Diagnosis not present

## 2018-08-12 DIAGNOSIS — Z79899 Other long term (current) drug therapy: Secondary | ICD-10-CM | POA: Diagnosis not present

## 2018-08-12 DIAGNOSIS — S8001XA Contusion of right knee, initial encounter: Secondary | ICD-10-CM | POA: Diagnosis not present

## 2018-08-12 MED ORDER — ACETAMINOPHEN 500 MG PO TABS
1000.0000 mg | ORAL_TABLET | Freq: Once | ORAL | Status: AC
  Administered 2018-08-12: 09:00:00 1000 mg via ORAL
  Filled 2018-08-12: qty 2

## 2018-08-12 MED FILL — LOSARTAN POTASSIUM 100 MG T: 100 | 30 days supply | Qty: 30 | Fill #1

## 2018-08-12 MED FILL — HYDROCHLOROTHIAZIDE 12.5 MG: 12.5 | 30 days supply | Qty: 30 | Fill #1

## 2018-08-12 NOTE — ED Provider Notes (Signed)
[facility]-EMERGENCY DEPT Provider Note   CSN: [id number] Arrival date & time: 08/12/18  0848    History   Chief Complaint Chief Complaint  Patient presents with   Fall   Leg Pain   Shoulder Pain    HPI Ashlee Williams is a 63 y.o. female.     63yo F w/ PMH below who p/w fall.  This morning around 7:15 AM when she got to work, she was going down stairs when she missed a step and fell down a couple of stairs.  She fell forward, landing on bilateral knees and hitting her left forehead and right shoulder/upper arm.  She did not lose consciousness and has had no vomiting since the event.  She has been able to ambulate but with pain.  She denies any chest, abdominal, or back injury.  No focal numbness. No anticoagulant use. No meds PTA.  The history is provided by the patient.  Fall   Leg Pain  Shoulder Pain    Past Medical History:  Diagnosis Date   AAA (abdominal aortic aneurysm) (HCC)    Allergy    Anemia    Asthma    Blood in stool    Bronchitis    Chicken pox    Chronic headaches    Diabetes mellitus    PT. IS TRYING TO CONTROL WITH DIET BUT AS NOTED WEIGHT IS UP   Hypertension    Obesity    Syncope and collapse 12/22/2016   Tobacco abuse     Patient Active Problem List   Diagnosis Date Noted   Pulmonary nodule 01/01/2017   Bronchitis 12/23/2016   Syncope 12/22/2016   Obstructive sleep apnea 06/11/2016   Mixed rhinitis 06/11/2016   Symptomatic cholelithiasis 03/16/2015   Hyperlipidemia associated with type 2 diabetes mellitus (HCC) 01/19/2015   Gallstones 11/18/2014   AAA (abdominal aortic aneurysm) (HCC)    Laryngitis 08/24/2014   Vitamin D deficiency 06/07/2014   Sinusitis 04/25/2011   Physical exam 02/14/2011   Hand pain 02/05/2011   Influenza A 12/23/2007   HEADACHE 05/09/2007   Diet-controlled diabetes mellitus (HCC) 04/04/2007   MORBID OBESITY 04/04/2007   Tobacco user 04/04/2007    Essential hypertension 04/04/2007    Past Surgical History:  Procedure Laterality Date   ABDOMINAL HYSTERECTOMY  1998   CHOLECYSTECTOMY N/A 03/16/2015   Procedure: LAPAROSCOPIC CHOLECYSTECTOMY;  Surgeon: Jimmye Norman, MD;  Location: [facility] OR;  Service: General;  Laterality: N/A;     OB History   No obstetric history on file.      Home Medications    Prior to Admission medications   Medication Sig Start Date End Date Taking? Authorizing Provider  amLODipine (NORVASC) 10 MG tablet Take 1 tablet (10 mg total) by mouth daily. 12/25/16   Joseph Art, DO  APAP-Isometheptene-Dichloral (MIDRIN) 325-65-100 MG CAPS Take 1 capsule by mouth every 6 (six) hours as needed for headache.     [provider]  atorvastatin (LIPITOR) 10 MG tablet TAKE 1 TABLET BY MOUTH DAILY. Patient taking differently: Take 10 mg by mouth daily at 6 PM.  05/02/17   Sheliah Hatch, MD  Azelastine-Fluticasone [facility]) 137-50 MCG/ACT SUSP Place 2 sprays into both nostrils at bedtime. 12/18/16   Jetty Duhamel D, MD  calcium-vitamin D (CALCIUM 500/D) 500-200 MG-UNIT per tablet Take 1 tablet by mouth.    [provider]  cetirizine (ZYRTEC) 10 MG tablet Take 10 mg by mouth daily.    [provider]  cyclobenzaprine (  FLEXERIL) 5 MG tablet Take 1 tablet (5 mg total) by mouth at bedtime. 10/24/17   Waldon Merl, PA-C  hydrochlorothiazide (HYDRODIURIL) 12.5 MG tablet Take 12.5 mg by mouth daily. 07/11/18   [provider]  Hyprom-Naphaz-Polysorb-Zn Sulf (CLEAR EYES COMPLETE) SOLN Place 1 drop into both eyes daily as needed (dry eyes).    [provider]  ipratropium (ATROVENT) 0.06 % nasal spray Place 2 sprays into both nostrils 4 (four) times daily. 11/29/16   Deatra Canter, FNP  losartan (COZAAR) 100 MG tablet Take 100 mg by mouth daily. 07/11/18   [provider]  losartan-hydrochlorothiazide (HYZAAR) 100-12.5 MG tablet Take 1 tablet by mouth daily. 07/11/18    Sheliah Hatch, MD  methocarbamol (ROBAXIN) 500 MG tablet Take 1 tablet (500 mg total) by mouth every 8 (eight) hours as needed. Patient taking differently: Take 500 mg by mouth every 8 (eight) hours as needed for muscle spasms.  02/19/18   Petrucelli, Samantha R, PA-C  methylPREDNISolone (MEDROL DOSEPAK) 4 MG TBPK tablet Take following package directions. Patient not taking: Reported on 08/12/2018 10/24/17   Waldon Merl, PA-C  Multiple Vitamins-Minerals (MULTIVITAMIN WITH MINERALS) tablet Take 1 tablet by mouth daily.    [provider]  nicotine polacrilex (COMMIT) 2 MG lozenge Take 2 mg by mouth as needed for smoking cessation.    [provider]  ondansetron (ZOFRAN ODT) 4 MG disintegrating tablet Take 1 tablet (4 mg total) by mouth every 8 (eight) hours as needed for nausea or vomiting. 11/16/14   Teressa Lower, NP  predniSONE (DELTASONE) 20 MG tablet 1 each morning x 5 days Patient not taking: Reported on 08/12/2018 05/14/18   Waymon Budge, MD  promethazine-codeine (PHENERGAN WITH CODEINE) 6.25-10 MG/5ML syrup Take 5 mLs by mouth every 6 (six) hours as needed for cough. 05/14/18   Waymon Budge, MD  VENTOLIN HFA 108 (90 Base) MCG/ACT inhaler INHALE 2 PUFFS INTO THE LUNGS EVERY 4 HOURS AS NEEDED FOR WHEEZING OR SHORTNESS OF BREATH. Patient taking differently: Inhale 2 puffs into the lungs every 4 (four) hours as needed for wheezing or shortness of breath.  02/07/16   Sheliah Hatch, MD  Vitamin D, Ergocalciferol, (DRISDOL) 50000 units CAPS capsule Take 1 capsule (50,000 Units total) by mouth every 7 (seven) days. 09/06/17   Sheliah Hatch, MD  zolpidem (AMBIEN) 5 MG tablet Take 1 tablet (5 mg total) by mouth at bedtime as needed for sleep. 11/01/16   Waymon Budge, MD    Family History Family History  Problem Relation Age of Onset   Hypertension Mother    Stroke Mother    Hypertension Father    Diabetes Sister    Hypertension Sister     Cancer Maternal Aunt    Hypertension Maternal Aunt    Stroke Maternal Grandmother    Hypertension Maternal Grandmother    Colon cancer Maternal Grandmother 46   Cancer Maternal Grandfather    Hypertension Maternal Grandfather    Hypertension Brother    Hypertension Maternal Uncle    Hypertension Paternal Aunt    Hypertension Paternal Uncle    Hypertension Paternal Grandmother    Hypertension Paternal Grandfather     Social History Social History   Tobacco Use   Smoking status: Current Every Day Smoker    Packs/day: 0.50    Years: 10.00    Pack years: 5.00    Types: Cigarettes   Smokeless tobacco: Never Used  Substance Use Topics  Alcohol use: Yes    Alcohol/week: 0.0 standard drinks   Drug use: No     Allergies   Patient has no known allergies.   Review of Systems Review of Systems All other systems reviewed and are negative except that which was mentioned in HPI   Physical Exam Updated Vital Signs BP (!) 145/87 (BP Location: Left Arm)    Pulse 87    Temp 98.4 F (36.9 C) (Oral)    Resp 18    LMP 11/10/2010    SpO2 100%   Physical Exam Vitals signs and nursing note reviewed.  Constitutional:      General: She is not in acute distress.    Appearance: She is well-developed.  HENT:     Head: Normocephalic and atraumatic.     Mouth/Throat:     Mouth: Mucous membranes are moist.     Pharynx: Oropharynx is clear.  Eyes:     Extraocular Movements: Extraocular movements intact.     Conjunctiva/sclera: Conjunctivae normal.     Pupils: Pupils are equal, round, and reactive to light.  Neck:     Musculoskeletal: Normal range of motion and neck supple.     Comments: R paraspinal muscle tenderness Cardiovascular:     Rate and Rhythm: Normal rate and regular rhythm.     Heart sounds: Normal heart sounds. No murmur.  Pulmonary:     Effort: Pulmonary effort is normal.     Breath sounds: Normal breath sounds.  Abdominal:     General: Bowel sounds  are normal. There is no distension.     Palpations: Abdomen is soft.     Tenderness: There is no abdominal tenderness.  Musculoskeletal: Normal range of motion.        General: No swelling.     Comments: Mild pain with flexion of b/l knees, no obvious effusion; full ROM R shoulder, elbow, wrist  Skin:    General: Skin is warm and dry.  Neurological:     Mental Status: She is alert and oriented to person, place, and time.     Sensory: No sensory deficit.     Motor: No weakness.     Comments: Fluent speech  Psychiatric:        Judgment: Judgment normal.      ED Treatments / Results  Labs (all labs ordered are listed, but only abnormal results are displayed) Labs Reviewed - No data to display  EKG None  Radiology Ct Head Wo Contrast  Result Date: 08/12/2018 CLINICAL DATA:  Patient status post fall on stairs today. Left head pain. Neck pain. Initial encounter. EXAM: CT HEAD WITHOUT CONTRAST CT CERVICAL SPINE WITHOUT CONTRAST TECHNIQUE: Multidetector CT imaging of the head and cervical spine was performed following the standard protocol without intravenous contrast. Multiplanar CT image reconstructions of the cervical spine were also generated. COMPARISON:  Head CT 12/22/2016. FINDINGS: CT HEAD FINDINGS Brain: No evidence of acute infarction, hemorrhage, hydrocephalus, extra-axial collection or mass lesion/mass effect. Vascular: No hyperdense vessel or unexpected calcification. Skull: Normal. Negative for fracture or focal lesion. Sinuses/Orbits: The left sphenoid sinus is completely opacified. Otherwise negative. Other: None. CT CERVICAL SPINE FINDINGS Alignment: Maintained with straightening of lordosis noted. Skull base and vertebrae: No acute fracture. No primary bone lesion or focal pathologic process. Soft tissues and spinal canal: No prevertebral fluid or swelling. No visible canal hematoma. Disc levels:  Negative. Upper chest: Lung apices clear. Other: None. IMPRESSION: No acute  abnormality head or cervical spine. Complete opacification of the  left sphenoid sinus noted. Electronically Signed   By: Drusilla Kannerhomas  Dalessio M.D.   On: 08/12/2018 10:26   Ct Cervical Spine Wo Contrast  Result Date: 08/12/2018 CLINICAL DATA:  Patient status post fall on stairs today. Left head pain. Neck pain. Initial encounter. EXAM: CT HEAD WITHOUT CONTRAST CT CERVICAL SPINE WITHOUT CONTRAST TECHNIQUE: Multidetector CT imaging of the head and cervical spine was performed following the standard protocol without intravenous contrast. Multiplanar CT image reconstructions of the cervical spine were also generated. COMPARISON:  Head CT 12/22/2016. FINDINGS: CT HEAD FINDINGS Brain: No evidence of acute infarction, hemorrhage, hydrocephalus, extra-axial collection or mass lesion/mass effect. Vascular: No hyperdense vessel or unexpected calcification. Skull: Normal. Negative for fracture or focal lesion. Sinuses/Orbits: The left sphenoid sinus is completely opacified. Otherwise negative. Other: None. CT CERVICAL SPINE FINDINGS Alignment: Maintained with straightening of lordosis noted. Skull base and vertebrae: No acute fracture. No primary bone lesion or focal pathologic process. Soft tissues and spinal canal: No prevertebral fluid or swelling. No visible canal hematoma. Disc levels:  Negative. Upper chest: Lung apices clear. Other: None. IMPRESSION: No acute abnormality head or cervical spine. Complete opacification of the left sphenoid sinus noted. Electronically Signed   By: Drusilla Kannerhomas  Dalessio M.D.   On: 08/12/2018 10:26   Dg Knee Complete 4 Views Left  Result Date: 08/12/2018 CLINICAL DATA:  Left knee pain after fall down stairs. EXAM: LEFT KNEE - COMPLETE 4+ VIEW COMPARISON:  None. FINDINGS: No evidence of fracture, dislocation, or joint effusion. Minimal osteophyte formation is seen involving the patellofemoral space as well as the lateral joint space. No significant joint space narrowing is noted. Soft tissues are  unremarkable. IMPRESSION: Minimal degenerative changes as described above. No acute abnormality seen in the left knee. Electronically Signed   By: Lupita RaiderJames  Green Jr M.D.   On: 08/12/2018 10:09   Dg Knee Complete 4 Views Right  Result Date: 08/12/2018 CLINICAL DATA:  Right knee pain after fall down stairs. EXAM: RIGHT KNEE - COMPLETE 4+ VIEW COMPARISON:  None. FINDINGS: No evidence of fracture, dislocation, or joint effusion. Mild osteophyte formation is seen involving the patellofemoral space as well as medial and lateral joint spaces. No significant narrowing is noted. Soft tissues are unremarkable. IMPRESSION: Mild degenerative changes as described above. No acute abnormality seen in the right knee. Electronically Signed   By: Lupita RaiderJames  Green Jr M.D.   On: 08/12/2018 10:10    Procedures Procedures (including critical care time)  Medications Ordered in ED Medications  acetaminophen (TYLENOL) tablet 1,000 mg (1,000 mg Oral Given 08/12/18 [id number])     Initial Impression / Assessment and Plan / ED Course  I have reviewed the triage vital signs and the nursing notes.  Pertinent imaging results that were available during my care of the patient were reviewed by me and considered in my medical decision making (see chart for details).        Well-appearing on exam, using both arms freely without problems.  Neurologically intact.  X-rays of knees and CT of head and neck which were reassuring against acute injury.  Discussed supportive measures and extensively reviewed return precautions regarding her head injury.  Patient voiced understanding.  Final Clinical Impressions(s) / ED Diagnoses   Final diagnoses:  Minor head injury, initial encounter  Contusion of knee, unspecified laterality, initial encounter    ED Discharge Orders    None       Chakita Mcgraw, Ambrose Finlandachel Morgan, MD 08/12/18 1038

## 2018-08-12 NOTE — ED Notes (Signed)
Patient transported to X-ray 

## 2018-08-12 NOTE — ED Triage Notes (Signed)
Pt reports was going down stairs from break room at work when missed a step an fell. C/o bilat knee pain, right shoulder and left head pain. Denies LOC or taking blood thinners.

## 2018-09-11 MED FILL — HYDROCHLOROTHIAZIDE 12.5 MG: 12.5 | 30 days supply | Qty: 30 | Fill #2

## 2018-09-11 MED FILL — LOSARTAN POTASSIUM 100 MG T: 100 | 30 days supply | Qty: 30 | Fill #2

## 2018-09-19 ENCOUNTER — Ambulatory Visit
Admission: RE | Admit: 2018-09-19 | Discharge: 2018-09-19 | Disposition: A | Discharge status: Home / Self Care | Payer: 59 | Source: Ambulatory Visit | Attending: Family Medicine | Admitting: Family Medicine

## 2018-09-19 ENCOUNTER — Other Ambulatory Visit: Payer: Self-pay

## 2018-09-19 DIAGNOSIS — Z1231 Encounter for screening mammogram for malignant neoplasm of breast: Secondary | ICD-10-CM | POA: Diagnosis not present

## 2018-10-15 ENCOUNTER — Other Ambulatory Visit: Payer: Self-pay | Admitting: Family Medicine

## 2018-10-15 MED FILL — HYDROCHLOROTHIAZIDE 12.5 MG: 12.5 | 90 days supply | Qty: 90 | Fill #0

## 2018-10-15 MED FILL — LOSARTAN POTASSIUM 100 MG T: 100 | 30 days supply | Qty: 30 | Fill #0

## 2018-11-17 MED FILL — LOSARTAN POTASSIUM 100 MG T: 100 | 30 days supply | Qty: 30 | Fill #1

## 2018-12-21 MED FILL — LOSARTAN POTASSIUM 100 MG T: 100 | 30 days supply | Qty: 30 | Fill #2

## 2018-12-22 ENCOUNTER — Other Ambulatory Visit: Payer: Self-pay

## 2018-12-22 ENCOUNTER — Encounter: Payer: Self-pay | Admitting: Family Medicine

## 2018-12-22 ENCOUNTER — Ambulatory Visit (INDEPENDENT_AMBULATORY_CARE_PROVIDER_SITE_OTHER): Payer: 59 | Admitting: Family Medicine

## 2018-12-22 VITALS — BP 132/81 | HR 80 | Temp 97.6°F | Resp 17 | Ht 63.0 in | Wt 258.0 lb

## 2018-12-22 DIAGNOSIS — Z23 Encounter for immunization: Secondary | ICD-10-CM | POA: Diagnosis not present

## 2018-12-22 DIAGNOSIS — E785 Hyperlipidemia, unspecified: Secondary | ICD-10-CM | POA: Diagnosis not present

## 2018-12-22 DIAGNOSIS — E1169 Type 2 diabetes mellitus with other specified complication: Secondary | ICD-10-CM | POA: Diagnosis not present

## 2018-12-22 DIAGNOSIS — Z0000 Encounter for general adult medical examination without abnormal findings: Secondary | ICD-10-CM

## 2018-12-22 DIAGNOSIS — E119 Type 2 diabetes mellitus without complications: Secondary | ICD-10-CM

## 2018-12-22 DIAGNOSIS — E559 Vitamin D deficiency, unspecified: Secondary | ICD-10-CM

## 2018-12-22 NOTE — Patient Instructions (Signed)
Follow up in 6 months to recheck diabetes, BP, cholesterol We'll notify you of your lab results and make any changes if needed Continue to work on healthy diet and regular exercise- you're doing great!!! Call with any questions or concerns Stay Safe!!!

## 2018-12-22 NOTE — Assessment & Plan Note (Signed)
Chronic problem.  Tolerating statin w/o difficulty.  Check labs.  Adjust meds prn  

## 2018-12-22 NOTE — Assessment & Plan Note (Signed)
Chronic problem.  UTD on eye exam.  On ARB for renal protection.  Foot exam done today.  Applauded her recent 11 lb weight loss.  Check labs.  Adjust tx plan prn.

## 2018-12-22 NOTE — Assessment & Plan Note (Signed)
Pt has hx of this.  Check labs and replete prn. 

## 2018-12-22 NOTE — Progress Notes (Signed)
   Subjective:    Patient ID: Ashlee Williams, female    DOB: [birth year], 63 y.o.   MRN: [id number]  HPI CPE- UTD on mammo, colonoscopy.  UTD on eye exam.  Due for foot exam.  On ARB for renal protection.  Pt is down 11 lbs- watching diet and walking daily.   Review of Systems Patient reports no vision/ hearing changes, adenopathy,fever, weight change,  persistant/recurrent hoarseness , swallowing issues, chest pain, palpitations, edema, persistant/recurrent cough, hemoptysis, dyspnea (rest/exertional/paroxysmal nocturnal), gastrointestinal bleeding (melena, rectal bleeding), abdominal pain, significant heartburn, bowel changes, GU symptoms (dysuria, hematuria, incontinence), Gyn symptoms (abnormal  bleeding, pain),  syncope, focal weakness, memory loss, numbness & tingling, skin/hair/nail changes, abnormal bruising or bleeding, anxiety, or depression.     Objective:   Physical Exam General Appearance:    Alert, cooperative, no distress, appears stated age, obese  Head:    Normocephalic, without obvious abnormality, atraumatic  Eyes:    PERRL, conjunctiva/corneas clear, EOM's intact, fundi    benign, both eyes  Ears:    Normal TM's and external ear canals, both ears  Nose:   Deferred due to COVID  Throat:   Neck:   Supple, symmetrical, trachea midline, no adenopathy;    Thyroid: no enlargement/tenderness/nodules  Back:     Symmetric, no curvature, ROM normal, no CVA tenderness  Lungs:     Clear to auscultation bilaterally, respirations unlabored  Chest Wall:    No tenderness or deformity   Heart:    Regular rate and rhythm, S1 and S2 normal, no murmur, rub   or gallop  Breast Exam:    Deferred to mammo  Abdomen:     Soft, non-tender, bowel sounds active all four quadrants,    no masses, no organomegaly  Genitalia:    Deferred  Rectal:    Extremities:   Extremities normal, atraumatic, no cyanosis or edema  Pulses:   2+ and symmetric all extremities  Skin:   Skin color, texture,  turgor normal, no rashes or lesions  Lymph nodes:   Cervical, supraclavicular, and axillary nodes normal  Neurologic:   CNII-XII intact, normal strength, sensation and reflexes    throughout          Assessment & Plan:

## 2018-12-22 NOTE — Assessment & Plan Note (Signed)
Pt has lost 11 lbs since last visit.  Applauded her efforts at healthy diet and regular exercise.  Encouraged her to continue.

## 2018-12-22 NOTE — Assessment & Plan Note (Signed)
Pt's PE WNL w/ exception of obesity.  UTD on mammo, colonoscopy, Tdap.  Flu shot given today.  Check labs.  Anticipatory guidance provided.

## 2018-12-23 ENCOUNTER — Encounter: Payer: Self-pay | Admitting: General Practice

## 2018-12-23 LAB — HEPATIC FUNCTION PANEL
ALT: 11 U/L (ref 0–35)
AST: 14 U/L (ref 0–37)
Albumin: 3.6 g/dL (ref 3.5–5.2)
Alkaline Phosphatase: 61 U/L (ref 39–117)
Bilirubin, Direct: 0.1 mg/dL (ref 0.0–0.3)
Total Bilirubin: 0.4 mg/dL (ref 0.2–1.2)
Total Protein: 6.5 g/dL (ref 6.0–8.3)

## 2018-12-23 LAB — LIPID PANEL
Cholesterol: 106 mg/dL (ref 0–200)
HDL: 39.4 mg/dL (ref >39.00)
LDL Cholesterol: 52 mg/dL (ref 0–99)
NonHDL: 66.78
Total CHOL/HDL Ratio: 3
Triglycerides: 75 mg/dL (ref 0.0–149.0)
VLDL: 15 mg/dL (ref 0.0–40.0)

## 2018-12-23 LAB — BASIC METABOLIC PANEL
BUN: 11 mg/dL (ref 6–23)
CO2: 29 mEq/L (ref 19–32)
Calcium: 9.4 mg/dL (ref 8.4–10.5)
Chloride: 105 mEq/L (ref 96–112)
Creatinine, Ser: 0.89 mg/dL (ref 0.40–1.20)
GFR: 77.75 mL/min (ref >60.00)
Glucose, Bld: 77 mg/dL (ref 70–99)
Potassium: 4.4 mEq/L (ref 3.5–5.1)
Sodium: 141 mEq/L (ref 135–145)

## 2018-12-23 LAB — CBC WITH DIFFERENTIAL/PLATELET
Basophils Absolute: 0.1 10*3/uL (ref 0.0–0.1)
Basophils Relative: 0.8 % (ref 0.0–3.0)
Eosinophils Absolute: 0.1 10*3/uL (ref 0.0–0.7)
Eosinophils Relative: 1.3 % (ref 0.0–5.0)
HCT: 39 % (ref 36.0–46.0)
Hemoglobin: 12.5 g/dL (ref 12.0–15.0)
Lymphocytes Relative: 36.4 % (ref 12.0–46.0)
Lymphs Abs: 2.5 10*3/uL (ref 0.7–4.0)
MCHC: 32 g/dL (ref 30.0–36.0)
MCV: 83.7 fl (ref 78.0–100.0)
Monocytes Absolute: 0.4 10*3/uL (ref 0.1–1.0)
Monocytes Relative: 6.3 % (ref 3.0–12.0)
Neutro Abs: 3.7 10*3/uL (ref 1.4–7.7)
Neutrophils Relative %: 55.2 % (ref 43.0–77.0)
Platelets: 217 10*3/uL (ref 150.0–400.0)
RBC: 4.66 Mil/uL (ref 3.87–5.11)
RDW: 15.2 % (ref 11.5–15.5)
WBC: 6.8 10*3/uL (ref 4.0–10.5)

## 2018-12-23 LAB — TSH: TSH: 1.08 u[IU]/mL (ref 0.35–4.50)

## 2018-12-23 LAB — HEMOGLOBIN A1C: Hgb A1c MFr Bld: 6.4 % (ref 4.6–6.5)

## 2018-12-23 LAB — VITAMIN D 25 HYDROXY (VIT D DEFICIENCY, FRACTURES): VITD: 41.79 ng/mL (ref 30.00–100.00)

## 2019-01-19 ENCOUNTER — Other Ambulatory Visit: Payer: Self-pay | Admitting: Family Medicine

## 2019-01-19 ENCOUNTER — Other Ambulatory Visit: Payer: Self-pay | Admitting: General Practice

## 2019-01-19 MED ORDER — LOSARTAN POTASSIUM-HCTZ 100-12.5 MG PO TABS: 1.0000 | ORAL_TABLET | Freq: Every day | ORAL | 0 refills | Status: DC

## 2019-01-19 MED ORDER — ATORVASTATIN CALCIUM 10 MG PO TABS: 10.0000 mg | ORAL_TABLET | Freq: Every day | ORAL | 1 refills | Status: DC

## 2019-01-19 MED ORDER — HYDROCHLOROTHIAZIDE 12.5 MG PO TABS: ORAL_TABLET | ORAL | 0 refills | Status: DC

## 2019-01-19 MED FILL — ATORVASTATIN 10 MG TABLET: 10 | 90 days supply | Qty: 90 | Fill #0

## 2019-01-19 MED FILL — LOSARTAN-HCTZ 100-12.5 MG T: 100-12.5 | 30 days supply | Qty: 30 | Fill #0

## 2019-02-17 MED FILL — LOSARTAN-HCTZ 100-12.5 MG T: 100-12.5 | 30 days supply | Qty: 30 | Fill #1

## 2019-03-02 ENCOUNTER — Encounter: Payer: Self-pay | Admitting: Family Medicine

## 2019-03-02 DIAGNOSIS — H524 Presbyopia: Secondary | ICD-10-CM | POA: Diagnosis not present

## 2019-03-02 LAB — HM DIABETES EYE EXAM

## 2019-03-11 ENCOUNTER — Encounter: Payer: Self-pay | Admitting: General Practice

## 2019-03-20 MED FILL — LOSARTAN-HCTZ 100-12.5 MG T: 100-12.5 | 30 days supply | Qty: 30 | Fill #2

## 2019-04-20 ENCOUNTER — Other Ambulatory Visit: Payer: Self-pay | Admitting: Family Medicine

## 2019-04-20 MED FILL — LOSARTAN-HCTZ 100-12.5 MG T: 100-12.5 | 30 days supply | Qty: 30 | Fill #0

## 2019-04-20 MED FILL — ATORVASTATIN 10 MG TABLET: 10 | 90 days supply | Qty: 90 | Fill #1
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
## 2019-06-22 ENCOUNTER — Other Ambulatory Visit: Payer: Self-pay

## 2019-06-22 ENCOUNTER — Ambulatory Visit: Payer: 59 | Admitting: Family Medicine

## 2019-06-22 ENCOUNTER — Encounter: Payer: Self-pay | Admitting: Family Medicine

## 2019-06-22 VITALS — BP 130/76 | HR 80 | Temp 97.8°F | Resp 16 | Ht 63.0 in | Wt 252.2 lb

## 2019-06-22 DIAGNOSIS — E1169 Type 2 diabetes mellitus with other specified complication: Secondary | ICD-10-CM

## 2019-06-22 DIAGNOSIS — I10 Essential (primary) hypertension: Secondary | ICD-10-CM

## 2019-06-22 DIAGNOSIS — E785 Hyperlipidemia, unspecified: Secondary | ICD-10-CM | POA: Diagnosis not present

## 2019-06-22 DIAGNOSIS — E119 Type 2 diabetes mellitus without complications: Secondary | ICD-10-CM

## 2019-06-22 NOTE — Patient Instructions (Signed)
Schedule your complete physical in 6 months We'll notify you of your lab results and make any changes Continue to work on healthy diet and regular exercise- you're doing great!! Call with any questions or concerns Stay Safe!  Stay Healthy!!

## 2019-06-22 NOTE — Assessment & Plan Note (Signed)
Pt has lost 6 lbs since last visit.  Applauded her efforts.  Will continue to follow.

## 2019-06-22 NOTE — Assessment & Plan Note (Signed)
Ongoing issue.  Pt is down 6 lbs since last visit.  Applauded her efforts at diet and exercise.  On ARB for renal protection, UTD on foot exam, eye exam.  Check labs and determine if medication is needed.

## 2019-06-22 NOTE — Assessment & Plan Note (Signed)
Chronic problem.  Tolerating statin w/o difficulty.  Check labs.  Adjust meds prn  

## 2019-06-22 NOTE — Assessment & Plan Note (Signed)
Chronic problem.  Adequate control.  Asymptomatic.  Check labs.  No anticipated med changes.  Will follow. 

## 2019-06-22 NOTE — Progress Notes (Signed)
   Subjective:    Patient ID: Ashlee Williams, female    DOB: 08-10-1955, 64 y.o.   MRN: [id number]  HPI HTN- chronic problem, on HCTZ 12.5mg  daily, Losartan HCTZ 100/12.5mg  daily w/ adequate control.  No CP, SOB, HAS, edema.  Hyperlipidemia- chronic problem, on Lipitor 10mg .  No abd pain, N/V.  DM- chronic problem, attempting to control w/ diet and exercise.  She is down 6 lbs since last visit.  UTD on eye exam, foot exam.  On ARB for renal protection.  No numbness/tingling of hands/feet.  Obesity- pt has lost 6 lbs since last visit.  Now exercising regularly and watching diet.  Has had 1st COVID vaccine, 2nd dose next week   Review of Systems For ROS see HPI   This visit occurred during the SARS-CoV-2 public health emergency.  Safety protocols were in place, including screening questions prior to the visit, additional usage of staff PPE, and extensive cleaning of exam room while observing appropriate contact time as indicated for disinfecting solutions.       Objective:   Physical Exam Vitals reviewed.  Constitutional:      General: She is not in acute distress.    Appearance: She is well-developed. She is obese.  HENT:     Head: Normocephalic and atraumatic.  Eyes:     Conjunctiva/sclera: Conjunctivae normal.     Pupils: Pupils are equal, round, and reactive to light.  Neck:     Thyroid: No thyromegaly.  Cardiovascular:     Rate and Rhythm: Normal rate and regular rhythm.     Heart sounds: Normal heart sounds. No murmur.  Pulmonary:     Effort: Pulmonary effort is normal. No respiratory distress.     Breath sounds: Normal breath sounds.  Abdominal:     General: There is no distension.     Palpations: Abdomen is soft.     Tenderness: There is no abdominal tenderness.  Musculoskeletal:     Cervical back: Normal range of motion and neck supple.  Lymphadenopathy:     Cervical: No cervical adenopathy.  Skin:    General: Skin is warm and dry.  Neurological:   Mental Status: She is alert and oriented to person, place, and time.  Psychiatric:        Behavior: Behavior normal.           Assessment & Plan:

## 2019-09-04 ENCOUNTER — Other Ambulatory Visit: Payer: Self-pay | Admitting: Family Medicine

## 2019-09-04 ENCOUNTER — Encounter: Payer: Self-pay | Admitting: Family Medicine

## 2019-09-04 MED ORDER — ATORVASTATIN CALCIUM 10 MG PO TABS: 10.0000 mg | ORAL_TABLET | Freq: Every day | ORAL | 1 refills | Status: DC

## 2019-09-04 MED ORDER — LOSARTAN POTASSIUM-HCTZ 100-12.5 MG PO TABS: 1.0000 | ORAL_TABLET | Freq: Every day | ORAL | 1 refills | Status: DC

## 2019-09-04 MED ORDER — HYDROCHLOROTHIAZIDE 12.5 MG PO TABS: ORAL_TABLET | ORAL | 1 refills | Status: DC

## 2019-09-04 MED FILL — HYDROCHLOROTHIAZIDE 12.5 MG: 12.5 | 90 days supply | Qty: 90 | Fill #0

## 2019-09-04 MED FILL — LOSARTAN-HCTZ 100-12.5 MG T: 100-12.5 | 90 days supply | Qty: 90 | Fill #0

## 2019-09-04 MED FILL — ATORVASTATIN CALCIUM 10 MG: 10 | 90 days supply | Qty: 90 | Fill #0

## 2019-09-04 NOTE — Progress Notes (Signed)
Meds filled at pt's request 

## 2019-11-02 ENCOUNTER — Other Ambulatory Visit: Payer: Self-pay | Admitting: Family Medicine

## 2019-11-02 DIAGNOSIS — Z1231 Encounter for screening mammogram for malignant neoplasm of breast: Secondary | ICD-10-CM

## 2019-11-09 ENCOUNTER — Other Ambulatory Visit: Payer: Self-pay

## 2019-11-09 ENCOUNTER — Ambulatory Visit
Admission: RE | Admit: 2019-11-09 | Discharge: 2019-11-09 | Disposition: A | Discharge status: Home / Self Care | Payer: 59 | Source: Ambulatory Visit | Attending: Family Medicine | Admitting: Family Medicine

## 2019-11-09 DIAGNOSIS — Z1231 Encounter for screening mammogram for malignant neoplasm of breast: Secondary | ICD-10-CM | POA: Diagnosis not present

## 2019-11-12 ENCOUNTER — Other Ambulatory Visit: Payer: Self-pay | Admitting: Family Medicine

## 2019-11-12 DIAGNOSIS — R928 Other abnormal and inconclusive findings on diagnostic imaging of breast: Secondary | ICD-10-CM

## 2019-11-25 ENCOUNTER — Other Ambulatory Visit: Payer: Self-pay | Admitting: Family Medicine

## 2019-11-25 ENCOUNTER — Other Ambulatory Visit: Payer: Self-pay

## 2019-11-25 ENCOUNTER — Ambulatory Visit
Admission: RE | Admit: 2019-11-25 | Discharge: 2019-11-25 | Disposition: A | Discharge status: Home / Self Care | Payer: 59 | Source: Ambulatory Visit | Attending: Family Medicine | Admitting: Family Medicine

## 2019-11-25 ENCOUNTER — Other Ambulatory Visit: Payer: 59

## 2019-11-25 DIAGNOSIS — R928 Other abnormal and inconclusive findings on diagnostic imaging of breast: Secondary | ICD-10-CM

## 2019-11-25 DIAGNOSIS — R599 Enlarged lymph nodes, unspecified: Secondary | ICD-10-CM

## 2019-11-25 DIAGNOSIS — N6310 Unspecified lump in the right breast, unspecified quadrant: Secondary | ICD-10-CM

## 2019-12-07 ENCOUNTER — Ambulatory Visit
Admission: RE | Admit: 2019-12-07 | Discharge: 2019-12-07 | Disposition: A | Discharge status: Home / Self Care | Payer: 59 | Source: Ambulatory Visit | Attending: Family Medicine | Admitting: Family Medicine

## 2019-12-07 ENCOUNTER — Other Ambulatory Visit: Payer: Self-pay

## 2019-12-07 DIAGNOSIS — N6310 Unspecified lump in the right breast, unspecified quadrant: Secondary | ICD-10-CM

## 2019-12-07 DIAGNOSIS — R590 Localized enlarged lymph nodes: Secondary | ICD-10-CM | POA: Diagnosis not present

## 2019-12-07 DIAGNOSIS — N6489 Other specified disorders of breast: Secondary | ICD-10-CM | POA: Diagnosis not present

## 2019-12-07 DIAGNOSIS — R599 Enlarged lymph nodes, unspecified: Secondary | ICD-10-CM

## 2019-12-07 DIAGNOSIS — N6311 Unspecified lump in the right breast, upper outer quadrant: Secondary | ICD-10-CM | POA: Diagnosis not present

## 2019-12-07 DIAGNOSIS — N6011 Diffuse cystic mastopathy of right breast: Secondary | ICD-10-CM | POA: Diagnosis not present

## 2019-12-07 HISTORY — PX: BREAST BIOPSY: SHX20

## 2019-12-23 ENCOUNTER — Encounter: Payer: 59 | Admitting: Family Medicine

## 2020-01-04 ENCOUNTER — Encounter: Payer: Self-pay | Admitting: Family Medicine

## 2020-01-04 ENCOUNTER — Other Ambulatory Visit: Payer: Self-pay

## 2020-01-04 ENCOUNTER — Ambulatory Visit (INDEPENDENT_AMBULATORY_CARE_PROVIDER_SITE_OTHER): Payer: 59 | Admitting: Family Medicine

## 2020-01-04 VITALS — BP 144/90 | HR 72 | Temp 97.6°F | Ht 63.5 in | Wt 250.4 lb

## 2020-01-04 DIAGNOSIS — E559 Vitamin D deficiency, unspecified: Secondary | ICD-10-CM

## 2020-01-04 DIAGNOSIS — I714 Abdominal aortic aneurysm, without rupture, unspecified: Secondary | ICD-10-CM

## 2020-01-04 DIAGNOSIS — Z0000 Encounter for general adult medical examination without abnormal findings: Secondary | ICD-10-CM

## 2020-01-04 DIAGNOSIS — E119 Type 2 diabetes mellitus without complications: Secondary | ICD-10-CM

## 2020-01-04 DIAGNOSIS — Z23 Encounter for immunization: Secondary | ICD-10-CM | POA: Diagnosis not present

## 2020-01-04 LAB — CBC WITH DIFFERENTIAL/PLATELET
Basophils Absolute: 0.1 10*3/uL (ref 0.0–0.1)
Basophils Relative: 1.1 % (ref 0.0–3.0)
Eosinophils Absolute: 0.1 10*3/uL (ref 0.0–0.7)
Eosinophils Relative: 2.1 % (ref 0.0–5.0)
HCT: 39.6 % (ref 36.0–46.0)
Hemoglobin: 13 g/dL (ref 12.0–15.0)
Lymphocytes Relative: 43 % (ref 12.0–46.0)
Lymphs Abs: 2.3 10*3/uL (ref 0.7–4.0)
MCHC: 32.7 g/dL (ref 30.0–36.0)
MCV: 84 fl (ref 78.0–100.0)
Monocytes Absolute: 0.5 10*3/uL (ref 0.1–1.0)
Monocytes Relative: 9.3 % (ref 3.0–12.0)
Neutro Abs: 2.4 10*3/uL (ref 1.4–7.7)
Neutrophils Relative %: 44.5 % (ref 43.0–77.0)
Platelets: 163 10*3/uL (ref 150.0–400.0)
RBC: 4.72 Mil/uL (ref 3.87–5.11)
RDW: 15.2 % (ref 11.5–15.5)
WBC: 5.4 10*3/uL (ref 4.0–10.5)

## 2020-01-04 LAB — HEPATIC FUNCTION PANEL
ALT: 13 U/L (ref 0–35)
AST: 15 U/L (ref 0–37)
Albumin: 4.1 g/dL (ref 3.5–5.2)
Alkaline Phosphatase: 65 U/L (ref 39–117)
Bilirubin, Direct: 0.1 mg/dL (ref 0.0–0.3)
Total Bilirubin: 0.5 mg/dL (ref 0.2–1.2)
Total Protein: 7.1 g/dL (ref 6.0–8.3)

## 2020-01-04 LAB — LIPID PANEL
Cholesterol: 143 mg/dL (ref 0–200)
HDL: 55 mg/dL (ref >39.00)
LDL Cholesterol: 71 mg/dL (ref 0–99)
NonHDL: 88.17
Total CHOL/HDL Ratio: 3
Triglycerides: 84 mg/dL (ref 0.0–149.0)
VLDL: 16.8 mg/dL (ref 0.0–40.0)

## 2020-01-04 LAB — BASIC METABOLIC PANEL
BUN: 16 mg/dL (ref 6–23)
CO2: 29 mEq/L (ref 19–32)
Calcium: 9.5 mg/dL (ref 8.4–10.5)
Chloride: 104 mEq/L (ref 96–112)
Creatinine, Ser: 0.93 mg/dL (ref 0.40–1.20)
GFR: 73.66 mL/min (ref >60.00)
Glucose, Bld: 78 mg/dL (ref 70–99)
Potassium: 4.4 mEq/L (ref 3.5–5.1)
Sodium: 142 mEq/L (ref 135–145)

## 2020-01-04 LAB — VITAMIN D 25 HYDROXY (VIT D DEFICIENCY, FRACTURES): VITD: 52.86 ng/mL (ref 30.00–100.00)

## 2020-01-04 LAB — HEMOGLOBIN A1C: Hgb A1c MFr Bld: 6.3 % (ref 4.6–6.5)

## 2020-01-04 LAB — TSH: TSH: 1.21 u[IU]/mL (ref 0.35–4.50)

## 2020-01-04 NOTE — Assessment & Plan Note (Signed)
Pt w/ hx of this.  Check labs and replete prn. 

## 2020-01-04 NOTE — Patient Instructions (Signed)
Follow up in 6 months to recheck blood pressure, cholesterol, and diabetes We'll notify you of your lab results and make any changes if needed Continue to work on healthy diet and regular exercise- you're doing great! Continue to work on quitting smoking.  You. Can. Do. It!! Call with any questions or concerns Stay Safe!  Stay Healthy!

## 2020-01-04 NOTE — Progress Notes (Signed)
Subjective:    Patient ID: Ashlee Williams, female    DOB: 03/02/1956, 64 y.o.   MRN: [id number]  HPI CPE- UTD on mammo, colonoscopy, COVID immunizations, Tdap, eye exam.  Due for flu shot and foot exam.  Reviewed past medical, surgical, family and social histories.   Health Maintenance  Topic Date Due  . COVID-19 Vaccine (1) Never done  . HEMOGLOBIN A1C  06/21/2019  . INFLUENZA VACCINE  11/08/2019  . FOOT EXAM  12/22/2019  . OPHTHALMOLOGY EXAM  03/01/2020  . TETANUS/TDAP  04/20/2020  . MAMMOGRAM  11/08/2020  . COLONOSCOPY  04/14/2024  . PNEUMOCOCCAL POLYSACCHARIDE VACCINE AGE 99-64 HIGH RISK  Completed  . Hepatitis C Screening  Completed  . HIV Screening  Completed    Patient Care Team    Relationship Specialty Notifications Start End  Sheliah Hatch, MD PCP - General Family Medicine  02/05/11    Comment: Merged (Merged)  Loletha Carrow, MD Consulting Physician Ophthalmology  09/05/17   Early, Kristen Loader, MD Consulting Physician Vascular Surgery  09/05/17   Waymon Budge, MD Consulting Physician Pulmonary Disease  09/05/17   Iva Boop, MD Consulting Physician Gastroenterology  09/05/17       Review of Systems Patient reports no vision/ hearing changes, adenopathy,fever, weight change,  persistant/recurrent hoarseness , swallowing issues, chest pain, palpitations, edema, persistant/recurrent cough, hemoptysis, dyspnea (rest/exertional/paroxysmal nocturnal), gastrointestinal bleeding (melena, rectal bleeding), abdominal pain, significant heartburn, bowel changes, GU symptoms (dysuria, hematuria, incontinence), Gyn symptoms (abnormal  bleeding, pain),  syncope, focal weakness, memory loss, numbness & tingling, skin/hair/nail changes, abnormal bruising or bleeding, anxiety, or depression.   This visit occurred during the SARS-CoV-2 public health emergency.  Safety protocols were in place, including screening questions prior to the visit, additional usage of staff  PPE, and extensive cleaning of exam room while observing appropriate contact time as indicated for disinfecting solutions.       Objective:   Physical Exam General Appearance:    Alert, cooperative, no distress, appears stated age, obese  Head:    Normocephalic, without obvious abnormality, atraumatic  Eyes:    PERRL, conjunctiva/corneas clear, EOM's intact, fundi    benign, both eyes  Ears:    Normal TM's and external ear canals, both ears  Nose:   Nares normal, septum midline, mucosa normal, no drainage    or sinus tenderness  Throat:   Lips, mucosa, and tongue normal; teeth and gums normal  Neck:   Supple, symmetrical, trachea midline, no adenopathy;    Thyroid: no enlargement/tenderness/nodules  Back:     Symmetric, no curvature, ROM normal, no CVA tenderness  Lungs:     Clear to auscultation bilaterally, respirations unlabored  Chest Wall:    No tenderness or deformity   Heart:    Regular rate and rhythm, S1 and S2 normal, no murmur, rub   or gallop  Breast Exam:    Deferred to GYN  Abdomen:     Soft, non-tender, bowel sounds active all four quadrants,    no masses, no organomegaly  Genitalia:    Deferred to GYN  Rectal:    Extremities:   Extremities normal, atraumatic, no cyanosis or edema  Pulses:   2+ and symmetric all extremities  Skin:   Skin color, texture, turgor normal, no rashes or lesions  Lymph nodes:   Cervical, supraclavicular, and axillary nodes normal  Neurologic:   CNII-XII intact, normal strength, sensation and reflexes    throughout  Assessment & Plan:

## 2020-01-04 NOTE — Addendum Note (Signed)
Addended by: Lana Fish on: 01/04/2020 08:35 AM   Modules accepted: Orders

## 2020-01-04 NOTE — Assessment & Plan Note (Signed)
Chronic problem.  UTD on eye exam.  On ACE for renal protection.  Foot exam done today.  Check labs and determine if medication is needed

## 2020-01-04 NOTE — Assessment & Plan Note (Signed)
Pt's PE WNL w/ exception of obesity.  UTD on Tdap, COVID, mammo, colonoscopy.  Flu shot given today.  Check labs.  Anticipatory guidance provided on healthy diet, regular exercise, and smoking cessation

## 2020-01-04 NOTE — Assessment & Plan Note (Signed)
Continues to follow w/ Dr Arbie Cookey

## 2020-01-05 ENCOUNTER — Encounter: Payer: Self-pay | Admitting: General Practice

## 2020-01-06 MED FILL — LOSARTAN-HCTZ 100-12.5 MG T: 100-12.5 | 90 days supply | Qty: 90 | Fill #1

## 2020-03-07 DIAGNOSIS — H524 Presbyopia: Secondary | ICD-10-CM | POA: Diagnosis not present

## 2020-04-05 DIAGNOSIS — H2513 Age-related nuclear cataract, bilateral: Secondary | ICD-10-CM | POA: Diagnosis not present

## 2020-04-05 DIAGNOSIS — H04123 Dry eye syndrome of bilateral lacrimal glands: Secondary | ICD-10-CM | POA: Diagnosis not present

## 2020-05-30 ENCOUNTER — Encounter: Payer: Self-pay | Admitting: Family Medicine

## 2020-05-30 ENCOUNTER — Telehealth: Payer: Self-pay

## 2020-05-30 NOTE — Telephone Encounter (Signed)
Pt would like to transfer care from Dr. Beverely Low to [facility]. Please Advise

## 2020-05-30 NOTE — Telephone Encounter (Signed)
Ok with me 

## 2020-05-30 NOTE — Telephone Encounter (Signed)
Ok to transfer. 

## 2020-05-30 NOTE — Telephone Encounter (Signed)
LVM for pt to get scheduled

## 2020-09-06 ENCOUNTER — Other Ambulatory Visit: Payer: Self-pay

## 2020-09-06 ENCOUNTER — Other Ambulatory Visit (HOSPITAL_COMMUNITY): Payer: Self-pay

## 2020-09-06 ENCOUNTER — Encounter: Payer: Self-pay | Admitting: Family Medicine

## 2020-09-06 MED ORDER — LOSARTAN POTASSIUM-HCTZ 100-12.5 MG PO TABS
1.0000 | ORAL_TABLET | Freq: Every day | ORAL | 0 refills | Status: DC
  Filled 2020-09-06: qty 90, 90d supply, fill #0

## 2020-09-07 ENCOUNTER — Other Ambulatory Visit: Payer: Self-pay

## 2020-09-07 ENCOUNTER — Encounter: Payer: Self-pay | Admitting: Family Medicine

## 2020-09-07 ENCOUNTER — Ambulatory Visit: Payer: 59 | Admitting: Family Medicine

## 2020-09-07 ENCOUNTER — Other Ambulatory Visit (HOSPITAL_COMMUNITY): Payer: Self-pay

## 2020-09-07 VITALS — BP 150/90 | HR 60 | Temp 98.3°F | Resp 20 | Ht 63.5 in | Wt 255.8 lb

## 2020-09-07 DIAGNOSIS — E785 Hyperlipidemia, unspecified: Secondary | ICD-10-CM

## 2020-09-07 DIAGNOSIS — E1169 Type 2 diabetes mellitus with other specified complication: Secondary | ICD-10-CM

## 2020-09-07 DIAGNOSIS — Z720 Tobacco use: Secondary | ICD-10-CM | POA: Diagnosis not present

## 2020-09-07 DIAGNOSIS — I10 Essential (primary) hypertension: Secondary | ICD-10-CM | POA: Diagnosis not present

## 2020-09-07 DIAGNOSIS — E119 Type 2 diabetes mellitus without complications: Secondary | ICD-10-CM | POA: Diagnosis not present

## 2020-09-07 DIAGNOSIS — G4452 New daily persistent headache (NDPH): Secondary | ICD-10-CM | POA: Diagnosis not present

## 2020-09-07 LAB — HEMOGLOBIN A1C: Hgb A1c MFr Bld: 6.5 % (ref 4.6–6.5)

## 2020-09-07 LAB — CBC WITH DIFFERENTIAL/PLATELET
Basophils Absolute: 0 10*3/uL (ref 0.0–0.1)
Basophils Relative: 0.7 % (ref 0.0–3.0)
Eosinophils Absolute: 0.1 10*3/uL (ref 0.0–0.7)
Eosinophils Relative: 2.5 % (ref 0.0–5.0)
HCT: 39.7 % (ref 36.0–46.0)
Hemoglobin: 12.7 g/dL (ref 12.0–15.0)
Lymphocytes Relative: 44.3 % (ref 12.0–46.0)
Lymphs Abs: 2.3 10*3/uL (ref 0.7–4.0)
MCHC: 31.9 g/dL (ref 30.0–36.0)
MCV: 82 fl (ref 78.0–100.0)
Monocytes Absolute: 0.5 10*3/uL (ref 0.1–1.0)
Monocytes Relative: 8.9 % (ref 3.0–12.0)
Neutro Abs: 2.3 10*3/uL (ref 1.4–7.7)
Neutrophils Relative %: 43.6 % (ref 43.0–77.0)
Platelets: 216 10*3/uL (ref 150.0–400.0)
RBC: 4.84 Mil/uL (ref 3.87–5.11)
RDW: 15.2 % (ref 11.5–15.5)
WBC: 5.2 10*3/uL (ref 4.0–10.5)

## 2020-09-07 LAB — TSH: TSH: 1.54 u[IU]/mL (ref 0.35–4.50)

## 2020-09-07 MED ORDER — AMLODIPINE BESYLATE 5 MG PO TABS
5.0000 mg | ORAL_TABLET | Freq: Every day | ORAL | 3 refills | Status: DC
Start: 2020-09-07 — End: 2021-02-23
  Filled 2020-09-07 – 2020-10-06 (×2): qty 30, 30d supply, fill #0
  Filled 2020-11-07: qty 30, 30d supply, fill #1
  Filled 2020-12-03: qty 30, 30d supply, fill #2
  Filled 2021-01-10: qty 30, 30d supply, fill #3

## 2020-09-07 NOTE — Patient Instructions (Addendum)
Follow up in 3-4 weeks to recheck BP We'll notify you of your lab results and make any changes Add the Amlodipine once daily to your Losartan HCTZ combo pill Continue to drink LOTS of water to help w/ headaches Alternate tylenol and ibuprofen to help ease the pain We'll call you with your neurology referral Call with any questions or concerns I'M SO PROUD OF YOU!!!!

## 2020-09-07 NOTE — Progress Notes (Signed)
   Subjective:    Patient ID: Ashlee Williams, female    DOB: [birth year], 65 y.o.   MRN: [id number]  HPI HTN- chronic problem, on Losartan HCTZ 100/12.5mg  daily.  BP is above goal today.  Pt reports increased HAs recently.  Occur in front of head and radiate down R side of face.  Sxs started ~2 weeks ago.  Denies increased stress recently.  No visual changes.  No CP, SOB.  HAs will ease w/ OTC meds but 'it comes right back'.  HAs worsen w/ heat.  No N/V associated w/ headaches.  HAs will occasionally wake her from sleep.  Denies sensitivity to light/sound.  Has HA at this time.  Currently using allergy medicine.  Pt has recently quit smoking!!  HAs started well after she quit  Hyperlipidemia- chronic problem, on Lipitor 10mg  daily.  Denies abd pain, N/V.  Obesity- pt has gained 6 lbs since last visit.  BMI now 44.6  DM- hx of diet controlled DM.  Due for eye exam- scheduled for June.  UTD on foot exam.  On ARB for renal protection   Review of Systems For ROS see HPI   This visit occurred during the SARS-CoV-2 public health emergency.  Safety protocols were in place, including screening questions prior to the visit, additional usage of staff PPE, and extensive cleaning of exam room while observing appropriate contact time as indicated for disinfecting solutions.       Objective:   Physical Exam Vitals reviewed.  Constitutional:      General: She is not in acute distress.    Appearance: Normal appearance. She is well-developed. She is obese.  HENT:     Head: Normocephalic and atraumatic.     Nose:     Comments: No TTP over frontal sinuses Eyes:     Extraocular Movements: Extraocular movements intact.     Conjunctiva/sclera: Conjunctivae normal.     Pupils: Pupils are equal, round, and reactive to light.  Neck:     Thyroid: No thyromegaly.  Cardiovascular:     Rate and Rhythm: Normal rate and regular rhythm.     Pulses: Normal pulses.     Heart sounds: Normal heart sounds.  No murmur heard.   Pulmonary:     Effort: Pulmonary effort is normal. No respiratory distress.     Breath sounds: Normal breath sounds.  Abdominal:     General: There is no distension.     Palpations: Abdomen is soft.     Tenderness: There is no abdominal tenderness.  Musculoskeletal:     Cervical back: Normal range of motion and neck supple.     Right lower leg: No edema.     Left lower leg: No edema.  Lymphadenopathy:     Cervical: No cervical adenopathy.  Skin:    General: Skin is warm and dry.  Neurological:     Mental Status: She is alert and oriented to person, place, and time.  Psychiatric:        Behavior: Behavior normal.           Assessment & Plan:

## 2020-09-07 NOTE — Assessment & Plan Note (Signed)
Deteriorated.  Pt has gained 6 lbs since last visit.  Discussed need for healthy diet and regular exercise.  Will continue to follow.

## 2020-09-07 NOTE — Assessment & Plan Note (Signed)
QUIT!  She quit smoking in April 2022.  I'm so proud of her and told her this!

## 2020-09-07 NOTE — Assessment & Plan Note (Signed)
Deteriorated.  Pt's HAs are not consistent w/ sinus or migraine.  She is taking her regular allergy medication.  I'm a bit concerned that her HAs are occasionally waking her up.  She denies changes to caffeine intake, she quit smoking months prior to sxs, no changes to medications or stress levels.  She reports sxs started ~2 weeks ago and are not consistent w/ previous HAs.  Will add additional BP meds in case this is the cause but will also refer to neuro for complete evaluation.  Pt expressed understanding and is in agreement w/ plan.

## 2020-09-07 NOTE — Assessment & Plan Note (Addendum)
Pt has eye exam scheduled for next month.  UTD on foot exam.  On ARB for renal protection.  Check labs and determine if starting meds is needed.

## 2020-09-07 NOTE — Assessment & Plan Note (Signed)
Chronic problem.  On Lipitor 10mg daily w/o difficulty.  Check labs.  Adjust meds prn  

## 2020-09-07 NOTE — Assessment & Plan Note (Signed)
Deteriorated.  Pt's BP is elevated today but it's hard to say if it's elevated and causing her HA or the pain of the HA is elevating her BP.  Will add low dose Amlodipine and follow closely.  Pt expressed understanding and is in agreement w/ plan.

## 2020-09-09 LAB — BASIC METABOLIC PANEL
BUN: 16 mg/dL (ref 6–23)
CO2: 21 mEq/L (ref 19–32)
Calcium: 9.4 mg/dL (ref 8.4–10.5)
Chloride: 105 mEq/L (ref 96–112)
Creatinine, Ser: 0.94 mg/dL (ref 0.40–1.20)
GFR: 64.46 mL/min (ref >60.00)
Glucose, Bld: 85 mg/dL (ref 70–99)
Potassium: 4.8 mEq/L (ref 3.5–5.1)
Sodium: 144 mEq/L (ref 135–145)

## 2020-09-09 LAB — HEPATIC FUNCTION PANEL
ALT: 10 U/L (ref 0–35)
AST: 12 U/L (ref 0–37)
Albumin: 3.9 g/dL (ref 3.5–5.2)
Alkaline Phosphatase: 80 U/L (ref 39–117)
Bilirubin, Direct: 0.1 mg/dL (ref 0.0–0.3)
Total Bilirubin: 0.3 mg/dL (ref 0.2–1.2)
Total Protein: 6.9 g/dL (ref 6.0–8.3)

## 2020-09-09 LAB — LIPID PANEL
Cholesterol: 138 mg/dL (ref 0–200)
HDL: 55.9 mg/dL (ref >39.00)
LDL Cholesterol: 66 mg/dL (ref 0–99)
NonHDL: 81.6
Total CHOL/HDL Ratio: 2
Triglycerides: 77 mg/dL (ref 0.0–149.0)
VLDL: 15.4 mg/dL (ref 0.0–40.0)

## 2020-09-15 ENCOUNTER — Other Ambulatory Visit (HOSPITAL_COMMUNITY): Payer: Self-pay

## 2020-10-04 ENCOUNTER — Other Ambulatory Visit (HOSPITAL_COMMUNITY): Payer: Self-pay

## 2020-10-04 DIAGNOSIS — E119 Type 2 diabetes mellitus without complications: Secondary | ICD-10-CM | POA: Diagnosis not present

## 2020-10-04 DIAGNOSIS — H04123 Dry eye syndrome of bilateral lacrimal glands: Secondary | ICD-10-CM | POA: Diagnosis not present

## 2020-10-04 DIAGNOSIS — H2513 Age-related nuclear cataract, bilateral: Secondary | ICD-10-CM | POA: Diagnosis not present

## 2020-10-04 MED ORDER — XIIDRA 5 % OP SOLN
OPHTHALMIC | 99 refills | Status: AC
  Filled 2020-10-04: qty 60, 30d supply, fill #0
  Filled 2020-11-16: qty 60, 30d supply, fill #1

## 2020-10-05 ENCOUNTER — Encounter: Payer: Self-pay | Admitting: *Deleted

## 2020-10-06 ENCOUNTER — Other Ambulatory Visit: Payer: Self-pay

## 2020-10-06 ENCOUNTER — Ambulatory Visit: Payer: 59 | Admitting: Family Medicine

## 2020-10-06 ENCOUNTER — Other Ambulatory Visit (HOSPITAL_COMMUNITY): Payer: Self-pay

## 2020-10-06 ENCOUNTER — Encounter: Payer: Self-pay | Admitting: Family Medicine

## 2020-10-06 VITALS — BP 150/90 | HR 71 | Temp 97.9°F | Resp 18 | Ht 63.5 in | Wt 261.2 lb

## 2020-10-06 DIAGNOSIS — I10 Essential (primary) hypertension: Secondary | ICD-10-CM

## 2020-10-06 NOTE — Assessment & Plan Note (Signed)
Unfortunately pt never picked up or started the Amlodipine prescribed at last visit.  Which explains why her BP is unchanged.  Encouraged pt to get medication and start so we can lower BP to normal range.  Pt expressed understanding and is in agreement w/ plan.

## 2020-10-06 NOTE — Patient Instructions (Addendum)
Follow up in 3-4 weeks (before I leave) PICK UP and START the Amlodipine daily CONTINUE the Losartan HCTZ daily Continue to limit your salt intake, increase your water intake, and work on healthy diet and regular exercise Call with any questions or concerns HAPPY 4TH!!

## 2020-10-06 NOTE — Progress Notes (Signed)
   Subjective:    Patient ID: Ashlee Williams, female    DOB: 1955-10-20, 65 y.o.   MRN: [id number]  HPI HTN- chronic problem.  At last visit Amlodipine 5mg  was added to her Losartan HCTZ 100/12.5mg  daily  BP is unchanged today and remains 150/90.  Pt is up 5 lbs since last visit.  Not checking home or work BPs.  Pt reports a lot of stress recently w/ multiple close friends/family passing away.  Denies HAs, dizziness, CP, SOB above baseline.  No swelling of hands/feet.  Pt reveals mid-way thru visit she did not start new medication.   Review of Systems For ROS see HPI   This visit occurred during the SARS-CoV-2 public health emergency.  Safety protocols were in place, including screening questions prior to the visit, additional usage of staff PPE, and extensive cleaning of exam room while observing appropriate contact time as indicated for disinfecting solutions.      Objective:   Physical Exam Vitals reviewed.  Constitutional:      General: She is not in acute distress.    Appearance: Normal appearance. She is well-developed. She is obese.  HENT:     Head: Normocephalic and atraumatic.  Eyes:     Conjunctiva/sclera: Conjunctivae normal.     Pupils: Pupils are equal, round, and reactive to light.  Neck:     Thyroid: No thyromegaly.  Cardiovascular:     Rate and Rhythm: Normal rate and regular rhythm.     Pulses: Normal pulses.     Heart sounds: Normal heart sounds. No murmur heard. Pulmonary:     Effort: Pulmonary effort is normal. No respiratory distress.     Breath sounds: Normal breath sounds.  Abdominal:     General: There is no distension.     Palpations: Abdomen is soft.     Tenderness: There is no abdominal tenderness.  Musculoskeletal:     Cervical back: Normal range of motion and neck supple.     Right lower leg: No edema.     Left lower leg: No edema.  Lymphadenopathy:     Cervical: No cervical adenopathy.  Skin:    General: Skin is warm and dry.   Neurological:     Mental Status: She is alert and oriented to person, place, and time.  Psychiatric:        Behavior: Behavior normal.          Assessment & Plan:

## 2020-10-26 ENCOUNTER — Encounter: Payer: Self-pay | Admitting: Family Medicine

## 2020-10-26 ENCOUNTER — Other Ambulatory Visit: Payer: Self-pay

## 2020-10-26 ENCOUNTER — Ambulatory Visit: Payer: 59 | Admitting: Family Medicine

## 2020-10-26 VITALS — BP 137/90 | HR 69 | Temp 97.6°F | Resp 18 | Ht 63.5 in | Wt 257.6 lb

## 2020-10-26 DIAGNOSIS — I10 Essential (primary) hypertension: Secondary | ICD-10-CM | POA: Diagnosis not present

## 2020-10-26 DIAGNOSIS — Z23 Encounter for immunization: Secondary | ICD-10-CM

## 2020-10-26 NOTE — Assessment & Plan Note (Signed)
Pt is down 4 lbs since last visit.  Applauded her efforts and encouraged her to continue.  Will follow.

## 2020-10-26 NOTE — Patient Instructions (Signed)
Schedule your complete physical for October No need for labs today! No med changes at this time Keep up the good work on healthy diet and regular exercise!!!  You're doing it!! Call with any questions or concerns Stay Safe!  Stay Healthy! Have a great summer!!!

## 2020-10-26 NOTE — Addendum Note (Signed)
Addended by: Joylene Igo L on: 10/26/2020 09:50 AM   Modules accepted: Orders

## 2020-10-26 NOTE — Progress Notes (Signed)
   Subjective:    Patient ID: Ashlee Williams, female    DOB: 01-06-1956, 65 y.o.   MRN: [id number]  HPI HTN- since last visit pt has started her Amlodipine 5mg  daily.  This is in addition to her Losartan HCTZ 100/12.5mg  daily.  SBP is better today- 150 --> 137.  DBP is the same.  Pt reports no side effects or difficulty w/ Amlodipine.  Pt reports feeling better since BP has come down.  HAs have improved.  No CP, SOB, visual changes, edema.  Obesity- pt is down 4 lbs.  Pt is working on both diet and exercise.   Review of Systems For ROS see HPI   This visit occurred during the SARS-CoV-2 public health emergency.  Safety protocols were in place, including screening questions prior to the visit, additional usage of staff PPE, and extensive cleaning of exam room while observing appropriate contact time as indicated for disinfecting solutions.      Objective:   Physical Exam Vitals reviewed.  Constitutional:      General: She is not in acute distress.    Appearance: Normal appearance. She is well-developed. She is obese.  HENT:     Head: Normocephalic and atraumatic.  Eyes:     Conjunctiva/sclera: Conjunctivae normal.     Pupils: Pupils are equal, round, and reactive to light.  Neck:     Thyroid: No thyromegaly.  Cardiovascular:     Rate and Rhythm: Normal rate and regular rhythm.     Pulses: Normal pulses.     Heart sounds: Normal heart sounds. No murmur heard. Pulmonary:     Effort: Pulmonary effort is normal. No respiratory distress.     Breath sounds: Normal breath sounds.  Abdominal:     General: There is no distension.     Palpations: Abdomen is soft.     Tenderness: There is no abdominal tenderness.  Musculoskeletal:     Cervical back: Normal range of motion and neck supple.     Right lower leg: No edema.     Left lower leg: No edema.  Lymphadenopathy:     Cervical: No cervical adenopathy.  Skin:    General: Skin is warm and dry.  Neurological:     General: No  focal deficit present.     Mental Status: She is alert and oriented to person, place, and time.  Psychiatric:        Behavior: Behavior normal.          Assessment & Plan:

## 2020-10-26 NOTE — Assessment & Plan Note (Signed)
BP is better today.  Reports feeling better.  No side effects.  HAs have improved.  No med changes at this time.  Will continue to follow.

## 2020-11-07 ENCOUNTER — Other Ambulatory Visit (HOSPITAL_COMMUNITY): Payer: Self-pay

## 2020-11-16 ENCOUNTER — Other Ambulatory Visit (HOSPITAL_COMMUNITY): Payer: Self-pay

## 2020-11-24 ENCOUNTER — Other Ambulatory Visit (HOSPITAL_COMMUNITY): Payer: Self-pay

## 2020-11-29 ENCOUNTER — Other Ambulatory Visit (HOSPITAL_COMMUNITY): Payer: Self-pay

## 2020-11-29 ENCOUNTER — Encounter: Payer: Self-pay | Admitting: Neurology

## 2020-11-29 ENCOUNTER — Ambulatory Visit: Payer: 59 | Admitting: Neurology

## 2020-11-29 ENCOUNTER — Ambulatory Visit (INDEPENDENT_AMBULATORY_CARE_PROVIDER_SITE_OTHER): Payer: 59 | Admitting: Neurology

## 2020-11-29 VITALS — BP 126/80 | HR 52 | Ht 63.5 in | Wt 259.4 lb

## 2020-11-29 DIAGNOSIS — G43709 Chronic migraine without aura, not intractable, without status migrainosus: Secondary | ICD-10-CM

## 2020-11-29 MED ORDER — TOPIRAMATE 25 MG PO TABS
ORAL_TABLET | ORAL | 3 refills | Status: AC
Start: 2020-11-29 — End: ?
  Filled 2020-11-29: qty 60, 30d supply, fill #0
  Filled 2021-04-07: qty 60, 30d supply, fill #1
  Filled 2021-11-21: qty 60, 30d supply, fill #2

## 2020-11-29 MED ORDER — ONDANSETRON 4 MG PO TBDP
4.0000 mg | ORAL_TABLET | Freq: Three times a day (TID) | ORAL | 3 refills | Status: AC | PRN
  Filled 2020-11-29: qty 30, 5d supply, fill #0
  Filled 2021-04-07: qty 30, 5d supply, fill #1
  Filled 2021-06-25: qty 30, 5d supply, fill #2

## 2020-11-29 MED ORDER — RIZATRIPTAN BENZOATE 10 MG PO TBDP
10.0000 mg | ORAL_TABLET | ORAL | 11 refills | Status: AC | PRN
Start: 2020-11-29 — End: ?
  Filled 2020-11-29: qty 9, 30d supply, fill #0
  Filled 2021-04-07: qty 9, 30d supply, fill #1

## 2020-11-29 NOTE — Progress Notes (Signed)
[facility]    Provider:  Dr Lucia Gaskins Requesting Provider: Sheliah Hatch, MD Primary Care Provider:  Sheliah Hatch, MD  CC:  Migraine  HPI:  Ashlee Williams is a 65 y.o. female here as requested by Sheliah Hatch, MD for Migraine. PMHx asthma, diabetes,HTN, obesity, tobacco abuse.    Headaches started worsening September 2021 so almost a year gao, but prior to that would have them occasionally. Start on the left side of the forehead then radiate to the ear, pulsating/pounding/throbbing.photo/phonophobia, nausea, no vomiting, light bother bother, a dark room would help, can last 24 hours. Started in the setting of stress and family death. Some days they are worse, an alleve may help, they are severe in pain, moving makes it worse. Can wake with them. Can be positionally worse. Can also happen during the course of the day. She did have sleep apnea, she lost weight and doesn;t need cpap anymore, 30 pounds. Not tired during the day, feels sleep apnea is resolved. 8 migraine days a months, 15 headache days a month. No other focal neurologic deficits, associated symptoms, inciting events or modifiable factors.  Reviewed notes, labs and imaging from outside physicians, which showed:  Hemoglobin A1c 6.5, CBC normal, BMP normal, TSH normal June 2022  From a thorough review of records, medications tried include: Tylenol, amlodipine (this is a calcium channel blocker such as verapamil), fioricet,flexeril, robaxin, reglan, naproxen, medro dosepak, zofran, prednisone, valsartan, amitriptyline, propranolol contraindicated due to asthma  CT 08/2018 personally reviewed images and agree with the following: TECHNIQUE: Multidetector CT imaging of the head and cervical spine was performed following the standard protocol without intravenous contrast. Multiplanar CT image reconstructions of the cervical spine were also generated.   COMPARISON:  Head CT 12/22/2016.    FINDINGS: CT HEAD FINDINGS   Brain: No evidence of acute infarction, hemorrhage, hydrocephalus, extra-axial collection or mass lesion/mass effect.   Vascular: No hyperdense vessel or unexpected calcification.   Skull: Normal. Negative for fracture or focal lesion.   Sinuses/Orbits: The left sphenoid sinus is completely opacified. Otherwise negative.   Other: None.   CT CERVICAL SPINE FINDINGS   Alignment: Maintained with straightening of lordosis noted.   Skull base and vertebrae: No acute fracture. No primary bone lesion or focal pathologic process.   Soft tissues and spinal canal: No prevertebral fluid or swelling. No visible canal hematoma.   Disc levels:  Negative.   Upper chest: Lung apices clear.   Other: None.   IMPRESSION: No acute abnormality head or cervical spine. Complete opacification of the left sphenoid sinus noted.  Review of Systems: Patient complains of symptoms per HPI as well as the following symptoms headache. Pertinent negatives and positives per HPI. All others negative.   Social History   Socioeconomic History   Marital status: Single    Spouse name: Not on file   Number of children: Not on file   Years of education: Not on file   Highest education level: Not on file  Occupational History   Not on file  Tobacco Use   Smoking status: Former    Packs/day: 0.50    Years: 10.00    Pack years: 5.00    Types: Cigarettes    Quit date: 07/27/2020    Years since quitting: 0.3   Smokeless tobacco: Never  Vaping Use   Vaping Use: Never used  Substance and Sexual Activity   Alcohol use: Yes    Comment: occ   Drug use: No  Sexual activity: Yes  Other Topics Concern   Not on file  Social History Narrative   Coffee every now and then,  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  Education : [employer].  Family single.    Social Determinants of Health   Financial Resource Strain: Not on file  Food Insecurity: Not on file   Transportation Needs: Not on file  Physical Activity: Not on file  Stress: Not on file  Social Connections: Not on file  Intimate Partner Violence: Not on file    Family History  Problem Relation Age of Onset   Hypertension Mother    Stroke Mother    Hypertension Father    Diabetes Sister    Hypertension Sister    Cancer Sister    Diabetes Sister    Hypertension Brother    Cancer Maternal Aunt    Hypertension Maternal Aunt    Hypertension Maternal Uncle    Hypertension Paternal Aunt    Hypertension Paternal Uncle    Stroke Maternal Grandmother    Hypertension Maternal Grandmother    Colon cancer Maternal Grandmother [id number]   Cancer Maternal Grandfather    Hypertension Maternal Grandfather    Hypertension Paternal Grandmother    Hypertension Paternal Grandfather     Past Medical History:  Diagnosis Date   AAA (abdominal aortic aneurysm) (HCC)    Allergy    Anemia    Asthma    Blood in stool    Bronchitis    Chicken pox    Chronic headaches    Diabetes mellitus    PT. IS TRYING TO CONTROL WITH DIET BUT AS NOTED WEIGHT IS UP   Hypertension    Obesity    Syncope and collapse 12/22/2016   Tobacco abuse     Patient Active Problem List   Diagnosis Date Noted   Chronic migraine w/o aura w/o status migrainosus, not intractable 11/29/2020   Pulmonary nodule 01/01/2017   Obstructive sleep apnea 06/11/2016   Mixed rhinitis 06/11/2016   Hyperlipidemia associated with type 2 diabetes mellitus (HCC) 01/19/2015   Gallstones 11/18/2014   AAA (abdominal aortic aneurysm) (HCC)    Vitamin D deficiency 06/07/2014   Physical exam 02/14/2011   Hand pain 02/05/2011   Headache 05/09/2007   Diet-controlled diabetes mellitus (HCC) 04/04/2007   MORBID OBESITY 04/04/2007   Tobacco user 04/04/2007   Essential hypertension 04/04/2007    Past Surgical History:  Procedure Laterality Date   ABDOMINAL HYSTERECTOMY  1998   CHOLECYSTECTOMY N/A 03/16/2015   Procedure: LAPAROSCOPIC  CHOLECYSTECTOMY;  Surgeon: Jimmye NormanJames Wyatt, MD;  Location: [facility] OR;  Service: General;  Laterality: N/A;    Current Outpatient Medications  Medication Sig Dispense Refill   amLODipine (NORVASC) 5 MG tablet Take 1 tablet (5 mg total) by mouth daily. 30 tablet 3   APAP-Isometheptene-Dichloral (MIDRIN) 325-65-100 MG CAPS Take 1 capsule by mouth every 6 (six) hours as needed for headache.     atorvastatin (LIPITOR) 10 MG tablet Take 1 tablet (10 mg total) by mouth daily. 90 tablet 1   Azelastine-Fluticasone (DYMISTA) 137-50 MCG/ACT SUSP Place 2 sprays into both nostrils at bedtime. 1 Bottle 0   calcium-vitamin D (OSCAL WITH D) 500-200 MG-UNIT tablet Take 1 tablet by mouth.     cetirizine (ZYRTEC) 10 MG tablet Take 10 mg by mouth daily.     cyclobenzaprine (FLEXERIL) 5 MG tablet Take 1 tablet (5 mg total) by mouth at bedtime. 10 tablet 1   Hyprom-Naphaz-Polysorb-Zn Sulf (CLEAR EYES COMPLETE) SOLN Place 1 drop  into both eyes daily as needed (dry eyes).     ipratropium (ATROVENT) 0.06 % nasal spray Place 2 sprays into both nostrils 4 (four) times daily. 15 mL 0   Lifitegrast (XIIDRA) 5 % SOLN Place 1 drop into both eyes 2 times a day 60 each PRN   losartan-hydrochlorothiazide (HYZAAR) 100-12.5 MG tablet Take 1 tablet by mouth daily. 90 tablet 0   methocarbamol (ROBAXIN) 500 MG tablet Take 1 tablet (500 mg total) by mouth every 8 (eight) hours as needed. 30 tablet 0   Multiple Vitamins-Minerals (MULTIVITAMIN WITH MINERALS) tablet Take 1 tablet by mouth daily.     nicotine polacrilex (COMMIT) 2 MG lozenge Take 2 mg by mouth as needed for smoking cessation.     ondansetron (ZOFRAN ODT) 4 MG disintegrating tablet Take 1 tablet (4 mg total) by mouth every 8 (eight) hours as needed for nausea or vomiting. 20 tablet 0   ondansetron (ZOFRAN-ODT) 4 MG disintegrating tablet Dissolve 1-2 tablets (4-8 mg total) by mouth every 8 (eight) hours as needed. 30 tablet 3   rizatriptan (MAXALT-MLT) 10 MG disintegrating tablet  Dissolve 1 tablet (10 mg total) by mouth as needed for migraine. May repeat in 2 hours if needed 9 tablet 11   topiramate (TOPAMAX) 25 MG tablet Start with one tablet by mouth at bedtime. In 2 weeks may increase to 2 tablets at bedtime (50mg ) 60 tablet 3   VENTOLIN HFA 108 (90 Base) MCG/ACT inhaler INHALE 2 PUFFS INTO THE LUNGS EVERY 4 HOURS AS NEEDED FOR WHEEZING OR SHORTNESS OF BREATH. (Patient taking differently: Inhale 2 puffs into the lungs every 4 (four) hours as needed for wheezing or shortness of breath.) 18 g 6   zolpidem (AMBIEN) 5 MG tablet Take 1 tablet (5 mg total) by mouth at bedtime as needed for sleep. 30 tablet 1   No current facility-administered medications for this visit.    Allergies as of 11/29/2020   (No Known Allergies)    Vitals: BP 126/80   Pulse (!) 52   Ht 5' 3.5" (1.613 m)   Wt 259 lb 6.4 oz (117.7 kg)   LMP 11/10/2010   BMI 45.23 kg/m  Last Weight:  Wt Readings from Last 1 Encounters:  11/29/20 259 lb 6.4 oz (117.7 kg)   Last Height:   Ht Readings from Last 1 Encounters:  11/29/20 5' 3.5" (1.613 m)     Physical exam: Exam: Gen: NAD, conversant, well nourised, obese, well groomed                     CV: RRR, no MRG. No Carotid Bruits. No peripheral edema, warm, nontender Eyes: Conjunctivae clear without exudates or hemorrhage  Neuro: Detailed Neurologic Exam  Speech:    Speech is normal; fluent and spontaneous with normal comprehension.  Cognition:    The patient is oriented to person, place, and time;     recent and remote memory intact;     language fluent;     normal attention, concentration,     fund of knowledge Cranial Nerves:    The pupils are equal, round, and reactive to light. The fundi are flat. Visual fields are full to finger confrontation. Extraocular movements are intact. Trigeminal sensation is intact and the muscles of mastication are normal. The face is symmetric. The palate elevates in the midline. Hearing intact. Voice  is normal. Shoulder shrug is normal. The tongue has normal motion without fasciculations.   Coordination:    Normal  Gait:    Normal.   Motor Observation:    No asymmetry, no atrophy, and no involuntary movements noted. Tone:    Normal muscle tone.    Posture:    Posture is normal. normal erect    Strength:    Strength is V/V in the upper and lower limbs.      Sensation: intact to LT     Reflex Exam:  DTR's:    Deep tendon reflexes in the upper and lower extremities are normal bilaterally.   Toes:    The toes are downgoing bilaterally.   Clonus:    Clonus is absent.    Assessment/Plan: This is a patient with possibly chronic migraines, difficult to exactly pinpoint her many migraine and headache days, asked her to keep a journal, we discussed migraines,, medications for migraine management, lifestyle, provided her with much documentation to read about migraine management.  We also talked about lots of the new medications available but need to try some of the more traditional first.  Start Topiramate at bedtime for prevention Acute/Emergency: Take Rizatriptan. Please take one tablet at the onset of your headache. If it does not improve the symptoms please take one additional tablet. Do not take more then 2 tablets in 24hrs. Do not take use more then 2 to 3 times in a week. Can take with Ondansetron.  Lots of new medications: Dimas Millin, Ajovy, Emgality. Qulipta.  If the above does not work, I will change her to 1 of these new medications.  CT head normal, low threshold for MRI  Meds ordered this encounter  Medications   rizatriptan (MAXALT-MLT) 10 MG disintegrating tablet    Sig: Dissolve 1 tablet (10 mg total) by mouth as needed for migraine. May repeat in 2 hours if needed    Dispense:  9 tablet    Refill:  11   ondansetron (ZOFRAN-ODT) 4 MG disintegrating tablet    Sig: Dissolve 1-2 tablets (4-8 mg total) by mouth every 8 (eight) hours as needed.    Dispense:   30 tablet    Refill:  3   topiramate (TOPAMAX) 25 MG tablet    Sig: Start with one tablet by mouth at bedtime. In 2 weeks may increase to 2 tablets at bedtime (50mg )    Dispense:  60 tablet    Refill:  3    Cc: , MD,  Sheliah Hatch, MD  Sheliah Hatch, MD  [facility] [address] [place], [place]  Phone [phone] Fax [phone]

## 2020-11-29 NOTE — Patient Instructions (Addendum)
Start Topiramate at bedtime for prevention Acute/Emergency: Take Rizatriptan. Please take one tablet at the onset of your headache. If it does not improve the symptoms please take one additional tablet. Do not take more then 2 tablets in 24hrs. Do not take use more then 2 to 3 times in a week. Can take with Ondansetron.  Lots of new medications: Dimas Millin, Ajovy, Emgality. Qulipta for example   Rizatriptan Disintegrating Tablets What is this medication? RIZATRIPTAN (rye za TRIP tan) treats migraines. It works by blocking pain signals and narrowing blood vessels in the brain. It belongs to a group ofmedications called triptans. It is not used to prevent migraines. This medicine may be used for other purposes; ask your health care provider orpharmacist if you have questions. COMMON BRAND NAME(S): Maxalt-MLT What should I tell my care team before I take this medication? They need to know if you have any of these conditions: Cigarette smoker Circulation problems in fingers and toes Diabetes Heart disease High blood pressure High cholesterol History of irregular heartbeat History of stroke Kidney disease Liver disease Stomach or intestine problems An unusual or allergic reaction to rizatriptan, other medications, foods, dyes, or preservatives Pregnant or trying to get pregnant Breast-feeding How should I use this medication? Take this medication by mouth. Follow the directions on the prescription label. Leave the tablet in the sealed blister pack until you are ready to take it. With dry hands, open the blister and gently remove the tablet. If the tablet breaks or crumbles, throw it away and take a new tablet out of the blister pack. Place the tablet in the mouth and allow it to dissolve, and then swallow. Do not cut, crush, or chew this medication. You do not need water to take thismedication. Do not take it more often than directed. Talk to your care team regarding the use of this  medication in children. While this medication may be prescribed for children as young as 6 years for selectedconditions, precautions do apply. Overdosage: If you think you have taken too much of this medicine contact apoison control center or emergency room at once. NOTE: This medicine is only for you. Do not share this medicine with others. What if I miss a dose? This does not apply. This medication is not for regular use. What may interact with this medication? Do not take this medication with any of the following medications: Certain medications for migraine headache like almotriptan, eletriptan, frovatriptan, naratriptan, rizatriptan, sumatriptan, zolmitriptan Ergot alkaloids like dihydroergotamine, ergonovine, ergotamine, methylergonovine MAOIs like Carbex, Eldepryl, Marplan, Nardil, and Parnate This medication may also interact with the following medications: Certain medications for depression, anxiety, or psychotic disorders Propranolol This list may not describe all possible interactions. Give your health care provider a list of all the medicines, herbs, non-prescription drugs, or dietary supplements you use. Also tell them if you smoke, drink alcohol, or use illegaldrugs. Some items may interact with your medicine. What should I watch for while using this medication? Visit your care team for regular checks on your progress. Tell your care teamif your symptoms do not start to get better or if they get worse. You may get drowsy or dizzy. Do not drive, use machinery, or do anything that needs mental alertness until you know how this medication affects you. Do not stand up or sit up quickly, especially if you are an older patient. This reduces the risk of dizzy or fainting spells. Alcohol may interfere with theeffect of this medication. Your mouth may get  dry. Chewing sugarless gum or sucking hard candy and drinking plenty of water may help. Contact your care team if the problem doesnot go away  or is severe. If you take migraine medications for 10 or more days a month, your migraines may get worse. Keep a diary of headache days and medication use. Contact yourcare team if your migraine attacks occur more frequently. What side effects may I notice from receiving this medication? Side effects that you should report to your care team as soon as possible: Allergic reactions-skin rash, itching, hives, swelling of the face, lips, tongue, or throat Burning, pain, tingling, or color changes in the legs or feet Heart attack-pain or tightness in the chest, shoulders, arms, or jaw, nausea, shortness of breath, cold or clammy skin, feeling faint or lightheaded Heart rhythm changes-fast or irregular heartbeat, dizziness, feeling faint or lightheaded, chest pain, trouble breathing Increase in blood pressure Irritability, confusion, fast or irregular heartbeat, muscle stiffness, twitching muscles, sweating, high fever, seizure, chills, vomiting, diarrhea, which may be signs of serotonin syndrome Raynaud's-cool, numb, or painful fingers or toes that may change color from pale, to blue, to red Seizures Stroke-sudden numbness or weakness of the face, arm, or leg, trouble speaking, confusion, trouble walking, loss of balance or coordination, dizziness, severe headache, change in vision Sudden or severe stomach pain, nausea, vomiting, fever, or bloody diarrhea Vision loss Side effects that usually do not require medical attention (report to your careteam if they continue or are bothersome): Dizziness General discomfort or fatigue This list may not describe all possible side effects. Call your doctor for medical advice about side effects. You may report side effects to FDA at1-800-FDA-1088. Where should I keep my medication? Keep out of the reach of children and pets. Store at room temperature between 15 and 30 degrees C (59 and 86 degrees F). Protect from light and moisture. Throw away any unused  medication after theexpiration date. NOTE: This sheet is a summary. It may not cover all possible information. If you have questions about this medicine, talk to your doctor, pharmacist, orhealth care provider.  2022 Elsevier/Gold Standard (2020-04-20 16:19:19) Ondansetron Dissolving Tablets What is this medication? ONDANSETRON (on DAN se tron) prevents nausea and vomiting from chemotherapy, radiation, or surgery. It works by blocking substances in the body that may cause nausea or vomiting. It belongs to a group of medications calledantiemetics. This medicine may be used for other purposes; ask your health care provider orpharmacist if you have questions. COMMON BRAND NAME(S): Zofran ODT What should I tell my care team before I take this medication? They need to know if you have any of these conditions: Heart disease History of irregular heartbeat Liver disease Low levels of magnesium or potassium in the blood An unusual or allergic reaction to ondansetron, granisetron, other medications, foods, dyes, or preservatives Pregnant or trying to get pregnant Breast-feeding How should I use this medication? These tablets are made to dissolve in the mouth. Do not try to push the tablet through the foil backing. With dry hands, peel away the foil backing and gently remove the tablet. Place the tablet in the mouth and allow it to dissolve, then swallow. While you may take these tablets with water, it is not necessary to doso. Talk to your care team regarding the use of this medication in children.Special care may be needed. Overdosage: If you think you have taken too much of this medicine contact apoison control center or emergency room at once. NOTE: This medicine is only  for you. Do not share this medicine with others. What if I miss a dose? If you miss a dose, take it as soon as you can. If it is almost time for yournext dose, take only that dose. Do not take double or extra doses. What may  interact with this medication? Do not take this medication with any of the following: Apomorphine Certain medications for fungal infections like fluconazole, itraconazole, ketoconazole, posaconazole, voriconazole Cisapride Dronedarone Pimozide Thioridazine This medication may also interact with the following: Carbamazepine Certain medications for depression, anxiety, or psychotic disturbances Fentanyl Linezolid MAOIs like Carbex, Eldepryl, Marplan, Nardil, and Parnate Methylene blue (injected into a vein) Other medications that prolong the QT interval (cause an abnormal heart rhythm) like dofetilide, ziprasidone Phenytoin Rifampicin Tramadol This list may not describe all possible interactions. Give your health care provider a list of all the medicines, herbs, non-prescription drugs, or dietary supplements you use. Also tell them if you smoke, drink alcohol, or use illegaldrugs. Some items may interact with your medicine. What should I watch for while using this medication? Check with your care team as soon as you can if you have any sign of anallergic reaction. What side effects may I notice from receiving this medication? Side effects that you should report to your care team as soon as possible: Allergic reactions-skin rash, itching, hives, swelling of the face, lips, tongue, or throat Bowel blockage-stomach cramping, unable to have a bowel movement or pass gas, loss of appetite, vomiting Chest pain (angina)-pain, pressure, or tightness in the chest, neck, back, or arms Heart rhythm changes-fast or irregular heartbeat, dizziness, feeling faint or lightheaded, chest pain, trouble breathing Irritability, confusion, fast or irregular heartbeat, muscle stiffness, twitching muscles, sweating, high fever, seizure, chills, vomiting, diarrhea, which may be signs of serotonin syndrome Side effects that usually do not require medical attention (report to your careteam if they continue or are  bothersome): Constipation Diarrhea General discomfort and fatigue Headache This list may not describe all possible side effects. Call your doctor for medical advice about side effects. You may report side effects to FDA at1-800-FDA-1088. Where should I keep my medication? Keep out of the reach of children and pets. Store between 2 and 30 degrees C (36 and 86 degrees F). Throw away any unusedmedication after the expiration date. NOTE: This sheet is a summary. It may not cover all possible information. If you have questions about this medicine, talk to your doctor, pharmacist, orhealth care provider.  2022 Elsevier/Gold Standard (2020-04-15 14:39:19) Topiramate tablets What is this medication? TOPIRAMATE (toe PYRE a mate) treats seizures in people with epilepsy. It is also used to prevent migraines. It works by calming overactive nerves in yourbody. This medicine may be used for other purposes; ask your health care provider orpharmacist if you have questions. COMMON BRAND NAME(S): Topamax, Topiragen What should I tell my care team before I take this medication? They need to know if you have any of these conditions: Bleeding disorder Kidney disease Lung disease Suicidal thoughts, plans or attempt An unusual or allergic reaction to topiramate, other medications, foods, dyes, or preservatives Pregnant or trying to get pregnant Breast-feeding How should I use this medication? Take this medication by mouth with water. Take it as directed on the prescription label at the same time every day. Do not cut, crush or chew this medicine. Swallow the tablets whole. You can take it with or without food. If it upsets your stomach, take it with food. Keep taking it unless your care  teamtells you to stop. A special MedGuide will be given to you by the pharmacist with eachprescription and refill. Be sure to read this information carefully each time. Talk to your care team about the use of this medication in  children. While it may be prescribed for children as young as 2 years for selected conditions,precautions do apply. Overdosage: If you think you have taken too much of this medicine contact apoison control center or emergency room at once. NOTE: This medicine is only for you. Do not share this medicine with others. What if I miss a dose? If you miss a dose, take it as soon as you can unless it is within 6 hours of the next dose. If it is within 6 hours of the next dose, skip the missed dose.Take the next dose at the normal time. Do not take double or extra doses. What may interact with this medication? Acetazolamide Alcohol Antihistamines for allergy, cough, and cold Aspirin and aspirin-like medications Atropine Birth control pills Certain medications for anxiety or sleep Certain medications for bladder problems like oxybutynin, tolterodine Certain medications for depression like amitriptyline, fluoxetine, sertraline Certain medications for Parkinson's disease like benztropine, trihexyphenidyl Certain medications for seizures like carbamazepine, lamotrigine, phenobarbital, phenytoin, primidone, valproic acid, zonisamide Certain medications for stomach problems like dicyclomine, hyoscyamine Certain medications for travel sickness like scopolamine Certain medications that treat or prevent blood clots like warfarin, enoxaparin, dalteparin, apixaban, dabigatran, and rivaroxaban Digoxin Diltiazem General anesthetics like halothane, isoflurane, methoxyflurane, propofol Glyburide Hydrochlorothiazide Ipratropium Lithium Medications that relax muscles Metformin Narcotic medications for pain NSAIDs, medications for pain and inflammation, like ibuprofen or naproxen Phenothiazines like chlorpromazine, mesoridazine, prochlorperazine, thioridazine Pioglitazone This list may not describe all possible interactions. Give your health care provider a list of all the medicines, herbs, non-prescription  drugs, or dietary supplements you use. Also tell them if you smoke, drink alcohol, or use illegaldrugs. Some items may interact with your medicine. What should I watch for while using this medication? Visit your care team for regular checks on your progress. Tell your care teamif your symptoms do not start to get better or if they get worse. Do not suddenly stop taking this medication. You may develop a severe reaction. Your care team will tell you how much medication to take. If your care team wants you to stop the medication, the dose may be slowly lowered over time toavoid any side effects. Wear a medical ID bracelet or chain. Carry a card that describes yourcondition. List the medications and doses you take on the card. You may get drowsy or dizzy. Do not drive, use machinery, or do anything that needs mental alertness until you know how this medication affects you. Do not stand up or sit up quickly, especially if you are an older patient. This reduces the risk of dizzy or fainting spells. Alcohol may interfere with theeffects of this medication. Avoid alcoholic drinks. This medication may cause serious skin reactions. They can happen weeks to months after starting the medication. Contact your care team right away if you notice fevers or flu-like symptoms with a rash. The rash may be red or purple and then turn into blisters or peeling of the skin. Or, you might notice a red rash with swelling of the face, lips or lymph nodes in your neck or under yourarms. Watch for new or worsening thoughts of suicide or depression. This includes sudden changes in mood, behaviors, or thoughts. These changes can happen at any time but are more common in  the beginning of treatment or after a change in dose. Call your care team right away if you experience these thoughts orworsening depression. This medication may slow your child's growth if it is taken for a long time athigh doses. Your care team will monitor your  child's growth. Using this medication for a long time may weaken your bones. The risk of bonefractures may be increased. Talk to your care team about your bone health. Do not become pregnant while taking this medication. Hormone forms of birth control may not work as well with this medication. Talk to your care team about other forms of birth control. There is potential for serious harm to an unbornchild. Tell your care team right away if you think you might be pregnant. What side effects may I notice from receiving this medication? Side effects that you should report to your care team as soon as possible: Allergic reactions-skin rash, itching, hives, swelling of the face, lips, tongue, or throat High acid levels-trouble breathing, fast irregular heartbeat, headache, confusion, unusually weak or tired, nausea, vomiting High ammonia levels-unusual weakness or fatigue, confusion, loss of appetite, nausea, vomiting, seizures High fever, fever that does not go away, or decreased sweating Kidney stones-blood in the urine, pain or trouble passing urine, pain in the lower back or sides Redness, blistering, peeling or loosening of the skin, including inside the mouth Sudden eye pain or change in vision such as blurry vision, seeing halos around lights, vision loss Thoughts of suicide or self-harm, worsening mood, feelings of depression Side effects that usually do not require medical attention (report to your careteam if they continue or are bothersome): Anxiety, nervousness Change in taste Diarrhea Dizziness Drowsiness Fatigue Loss of appetite with weight loss Pain, tingling, or numbness in the hands or feet Trouble concentrating Trouble speaking This list may not describe all possible side effects. Call your doctor for medical advice about side effects. You may report side effects to FDA at1-800-FDA-1088. Where should I keep my medication? Keep out of the reach of children and pets. Store  between 15 and 30 degrees C (59 and 86 degrees F). Protect from moisture. Keep the container tightly closed. Get rid of any unused medication after theexpiration date. To get rid of medications that are no longer needed or have expired: Take the medication to a medication take-back program. Check with your pharmacy or law enforcement to find a location. If you cannot return the medication, check the label or package insert to see if the medication should be thrown out in the garbage or flushed down the toilet. If you are not sure, ask your care team. If it is safe to put it in the trash, empty the medication out of the container. Mix the medication with cat litter, dirt, coffee grounds, or other unwanted substance. Seal the mixture in a bag or container. Put it in the trash. NOTE: This sheet is a summary. It may not cover all possible information. If you have questions about this medicine, talk to your doctor, pharmacist, orhealth care provider.  2022 Elsevier/Gold Standard (2020-05-09 10:05:08)

## 2020-11-30 ENCOUNTER — Other Ambulatory Visit (HOSPITAL_COMMUNITY): Payer: Self-pay

## 2020-11-30 ENCOUNTER — Other Ambulatory Visit: Payer: Self-pay | Admitting: Family Medicine

## 2020-11-30 MED ORDER — LOSARTAN POTASSIUM-HCTZ 100-12.5 MG PO TABS
1.0000 | ORAL_TABLET | Freq: Every day | ORAL | 0 refills | Status: DC
  Filled 2020-11-30: qty 90, 90d supply, fill #0

## 2020-12-05 ENCOUNTER — Other Ambulatory Visit (HOSPITAL_COMMUNITY): Payer: Self-pay

## 2020-12-06 DIAGNOSIS — H04123 Dry eye syndrome of bilateral lacrimal glands: Secondary | ICD-10-CM | POA: Diagnosis not present

## 2021-01-10 ENCOUNTER — Other Ambulatory Visit (HOSPITAL_COMMUNITY): Payer: Self-pay

## 2021-01-26 ENCOUNTER — Ambulatory Visit (INDEPENDENT_AMBULATORY_CARE_PROVIDER_SITE_OTHER): Payer: 59 | Admitting: Family Medicine

## 2021-01-26 ENCOUNTER — Other Ambulatory Visit: Payer: Self-pay

## 2021-01-26 ENCOUNTER — Encounter: Payer: Self-pay | Admitting: Family Medicine

## 2021-01-26 VITALS — BP 122/80 | HR 70 | Temp 98.4°F | Resp 16 | Ht 63.5 in | Wt 259.4 lb

## 2021-01-26 DIAGNOSIS — E559 Vitamin D deficiency, unspecified: Secondary | ICD-10-CM | POA: Diagnosis not present

## 2021-01-26 DIAGNOSIS — Z0000 Encounter for general adult medical examination without abnormal findings: Secondary | ICD-10-CM

## 2021-01-26 DIAGNOSIS — E119 Type 2 diabetes mellitus without complications: Secondary | ICD-10-CM

## 2021-01-26 DIAGNOSIS — I714 Abdominal aortic aneurysm, without rupture, unspecified: Secondary | ICD-10-CM

## 2021-01-26 NOTE — Patient Instructions (Signed)
Follow up in 6 months to recheck BP and cholesterol We'll notify you of your lab results and make any changes if needed Call and schedule your mammogram Continue to work on healthy diet and regular exercise- you can do it! Call with any questions or concerns Stay Safe!  Stay Healthy! Happy Fall!!

## 2021-01-26 NOTE — Assessment & Plan Note (Signed)
Check labs and replete prn. 

## 2021-01-26 NOTE — Assessment & Plan Note (Signed)
Pt's PE unchanged from previous and WNL w/ exception of obesity.  UTD on colonoscopy, Tdap, flu.  UTD on eye exam.  Pt to schedule mammo.  Check labs.  Anticipatory guidance provided.

## 2021-01-26 NOTE — Assessment & Plan Note (Signed)
Ongoing issue for pt.  Last A1C 6.5%.  UTD on eye exam.  Foot exam done today.  On ARB for renal protection.  Check labs.  Start meds prn.

## 2021-01-26 NOTE — Progress Notes (Signed)
Subjective:    Patient ID: Ashlee Williams, female    DOB: 09-12-1955, 65 y.o.   MRN: [id number]  HPI CPE- UTD on colonoscopy, Tdap, flu.  UTD on eye exam.  Due for mammo.  Due for foot exam.  No concerns today.  Patient Care Team    Relationship Specialty Notifications Start End  Sheliah Hatch, MD PCP - General Family Medicine  02/05/11    Comment: Merged (Merged)  Loletha Carrow, MD Consulting Physician Ophthalmology  09/05/17   Early, Kristen Loader, MD Consulting Physician Vascular Surgery  09/05/17   Waymon Budge, MD Consulting Physician Pulmonary Disease  09/05/17   Iva Boop, MD Consulting Physician Gastroenterology  09/05/17     Health Maintenance  Topic Date Due   Pneumococcal Vaccine 21-46 Years old (2 - PCV) 02/04/2015   FOOT EXAM  01/03/2021   COVID-19 Vaccine (3 - Booster for Pfizer series) 04/08/2021 (Originally 09/04/2019)   Zoster Vaccines- Shingrix (1 of 2) 04/28/2021 (Originally 12/05/2006)   MAMMOGRAM  01/26/2022 (Originally 11/08/2020)   HEMOGLOBIN A1C  03/09/2021   OPHTHALMOLOGY EXAM  10/05/2021   COLONOSCOPY (Pts 45-60yrs Insurance coverage will need to be confirmed)  04/14/2024   TETANUS/TDAP  10/27/2030   INFLUENZA VACCINE  Completed   Hepatitis C Screening  Completed   HIV Screening  Completed   HPV VACCINES  Aged Out      Review of Systems Patient reports no vision/ hearing changes, adenopathy,fever, weight change,  persistant/recurrent hoarseness , swallowing issues, chest pain, palpitations, edema, persistant/recurrent cough, hemoptysis, dyspnea (rest/exertional/paroxysmal nocturnal), gastrointestinal bleeding (melena, rectal bleeding), abdominal pain, significant heartburn, bowel changes, GU symptoms (dysuria, hematuria, incontinence), Gyn symptoms (abnormal  bleeding, pain),  syncope, focal weakness, memory loss, numbness & tingling, skin/hair/nail changes, abnormal bruising or bleeding, anxiety, or depression.   This visit occurred  during the SARS-CoV-2 public health emergency.  Safety protocols were in place, including screening questions prior to the visit, additional usage of staff PPE, and extensive cleaning of exam room while observing appropriate contact time as indicated for disinfecting solutions.      Objective:   Physical Exam General Appearance:    Alert, cooperative, no distress, appears stated age, obese  Head:    Normocephalic, without obvious abnormality, atraumatic  Eyes:    PERRL, conjunctiva/corneas clear, EOM's intact, fundi    benign, both eyes  Ears:    Normal TM's and external ear canals, both ears  Nose:   Deferred due to COVID  Throat:   Neck:   Supple, symmetrical, trachea midline, no adenopathy;    Thyroid: no enlargement/tenderness/nodules  Back:     Symmetric, no curvature, ROM normal, no CVA tenderness  Lungs:     Clear to auscultation bilaterally, respirations unlabored  Chest Wall:    No tenderness or deformity   Heart:    Regular rate and rhythm, S1 and S2 normal, no murmur, rub   or gallop  Breast Exam:    Deferred to mammo  Abdomen:     Soft, non-tender, bowel sounds active all four quadrants,    no masses, no organomegaly  Genitalia:    Deferred  Rectal:    Extremities:   Extremities normal, atraumatic, no cyanosis or edema  Pulses:   2+ and symmetric all extremities  Skin:   Skin color, texture, turgor normal, no rashes or lesions  Lymph nodes:   Cervical, supraclavicular, and axillary nodes normal  Neurologic:   CNII-XII intact, normal strength, sensation and reflexes  throughout          Assessment & Plan:

## 2021-01-26 NOTE — Assessment & Plan Note (Addendum)
Ongoing issue.  Continues to follow w/ Dr Arbie Cookey

## 2021-01-27 LAB — CBC WITH DIFFERENTIAL/PLATELET
Basophils Absolute: 0 10*3/uL (ref 0.0–0.1)
Basophils Relative: 0.5 % (ref 0.0–3.0)
Eosinophils Absolute: 0.1 10*3/uL (ref 0.0–0.7)
Eosinophils Relative: 1.6 % (ref 0.0–5.0)
HCT: 40.4 % (ref 36.0–46.0)
Hemoglobin: 12.8 g/dL (ref 12.0–15.0)
Lymphocytes Relative: 44.1 % (ref 12.0–46.0)
Lymphs Abs: 2.6 10*3/uL (ref 0.7–4.0)
MCHC: 31.7 g/dL (ref 30.0–36.0)
MCV: 83.4 fl (ref 78.0–100.0)
Monocytes Absolute: 0.5 10*3/uL (ref 0.1–1.0)
Monocytes Relative: 8.8 % (ref 3.0–12.0)
Neutro Abs: 2.6 10*3/uL (ref 1.4–7.7)
Neutrophils Relative %: 45 % (ref 43.0–77.0)
Platelets: 222 10*3/uL (ref 150.0–400.0)
RBC: 4.85 Mil/uL (ref 3.87–5.11)
RDW: 15.1 % (ref 11.5–15.5)
WBC: 5.8 10*3/uL (ref 4.0–10.5)

## 2021-01-27 LAB — VITAMIN D 25 HYDROXY (VIT D DEFICIENCY, FRACTURES): VITD: 35.91 ng/mL (ref 30.00–100.00)

## 2021-01-27 LAB — HEPATIC FUNCTION PANEL
ALT: 11 U/L (ref 0–35)
AST: 13 U/L (ref 0–37)
Albumin: 4 g/dL (ref 3.5–5.2)
Alkaline Phosphatase: 70 U/L (ref 39–117)
Bilirubin, Direct: 0.1 mg/dL (ref 0.0–0.3)
Total Bilirubin: 0.6 mg/dL (ref 0.2–1.2)
Total Protein: 7 g/dL (ref 6.0–8.3)

## 2021-01-27 LAB — LIPID PANEL
Cholesterol: 130 mg/dL (ref 0–200)
HDL: 48.3 mg/dL (ref >39.00)
LDL Cholesterol: 64 mg/dL (ref 0–99)
NonHDL: 81.75
Total CHOL/HDL Ratio: 3
Triglycerides: 91 mg/dL (ref 0.0–149.0)
VLDL: 18.2 mg/dL (ref 0.0–40.0)

## 2021-01-27 LAB — BASIC METABOLIC PANEL
BUN: 15 mg/dL (ref 6–23)
CO2: 30 mEq/L (ref 19–32)
Calcium: 9.6 mg/dL (ref 8.4–10.5)
Chloride: 104 mEq/L (ref 96–112)
Creatinine, Ser: 0.87 mg/dL (ref 0.40–1.20)
GFR: 70.54 mL/min (ref >60.00)
Glucose, Bld: 99 mg/dL (ref 70–99)
Potassium: 3.9 mEq/L (ref 3.5–5.1)
Sodium: 141 mEq/L (ref 135–145)

## 2021-01-27 LAB — HEMOGLOBIN A1C: Hgb A1c MFr Bld: 6.4 % (ref 4.6–6.5)

## 2021-01-27 LAB — TSH: TSH: 0.58 u[IU]/mL (ref 0.35–5.50)

## 2021-02-23 ENCOUNTER — Other Ambulatory Visit (HOSPITAL_COMMUNITY): Payer: Self-pay

## 2021-02-23 ENCOUNTER — Other Ambulatory Visit: Payer: Self-pay | Admitting: Family Medicine

## 2021-02-23 MED ORDER — AMLODIPINE BESYLATE 5 MG PO TABS
5.0000 mg | ORAL_TABLET | Freq: Every day | ORAL | 3 refills | Status: DC
  Filled 2021-02-23: qty 30, 30d supply, fill #0
  Filled 2021-04-07: qty 30, 30d supply, fill #1
  Filled 2021-05-09: qty 30, 30d supply, fill #2
  Filled 2021-06-14: qty 30, 30d supply, fill #3

## 2021-02-27 ENCOUNTER — Telehealth: Payer: Self-pay | Admitting: Neurology

## 2021-02-27 NOTE — Progress Notes (Signed)
[facility]    Provider:  Dr Lucia Gaskins Requesting Provider: Sheliah Hatch, MD Primary Care Provider:  Sheliah Hatch, MD  CC:  Migraine  Interval history 03/06/2021: patient doing great, no problems, continue current regimen. Could not connect video, did via phone, so short No Charge less than 2 minutes  Virtual Visit via Telephone Note  I connected with Ashlee Williams on 03/06/21 at 10:30 AM EST by telephone and verified that I am speaking with the correct person using two identifiers.  Location: Patient: home Provider: office   I discussed the limitations, risks, security and privacy concerns of performing an evaluation and management service by telephone and the availability of in person appointments. I also discussed with the patient that there may be a patient responsible charge related to this service. The patient expressed understanding and agreed to proceed.    Follow Up Instructions:    I discussed the assessment and treatment plan with the patient. The patient was provided an opportunity to ask questions and all were answered. The patient agreed with the plan and demonstrated an understanding of the instructions.   The patient was advised to call back or seek an in-person evaluation if the symptoms worsen or if the condition fails to improve as anticipated.  I provided 2 minutes of non-face-to-face time during this encounter.   Anson Fret, MD      HPI:  Ashlee Williams is a 65 y.o. female here as requested by Sheliah Hatch, MD for Migraine. PMHx asthma, diabetes,HTN, obesity, tobacco abuse.    Headaches started worsening September 2021 so almost a year gao, but prior to that would have them occasionally. Start on the left side of the forehead then radiate to the ear, pulsating/pounding/throbbing.photo/phonophobia, nausea, no vomiting, light bother bother, a dark room would help, can last 24 hours. Started in  the setting of stress and family death. Some days they are worse, an alleve may help, they are severe in pain, moving makes it worse. Can wake with them. Can be positionally worse. Can also happen during the course of the day. She did have sleep apnea, she lost weight and doesn;t need cpap anymore, 30 pounds. Not tired during the day, feels sleep apnea is resolved. 8 migraine days a months, 15 headache days a month. No other focal neurologic deficits, associated symptoms, inciting events or modifiable factors.  Reviewed notes, labs and imaging from outside physicians, which showed:  Hemoglobin A1c 6.5, CBC normal, BMP normal, TSH normal June 2022  From a thorough review of records, medications tried include: Tylenol, amlodipine (this is a calcium channel blocker such as verapamil), fioricet,flexeril, robaxin, reglan, naproxen, medro dosepak, zofran, prednisone, valsartan, amitriptyline, propranolol contraindicated due to asthma  CT 08/2018 personally reviewed images and agree with the following: TECHNIQUE: Multidetector CT imaging of the head and cervical spine was performed following the standard protocol without intravenous contrast. Multiplanar CT image reconstructions of the cervical spine were also generated.   COMPARISON:  Head CT 12/22/2016.   FINDINGS: CT HEAD FINDINGS   Brain: No evidence of acute infarction, hemorrhage, hydrocephalus, extra-axial collection or mass lesion/mass effect.   Vascular: No hyperdense vessel or unexpected calcification.   Skull: Normal. Negative for fracture or focal lesion.   Sinuses/Orbits: The left sphenoid sinus is completely opacified. Otherwise negative.   Other: None.   CT CERVICAL SPINE FINDINGS   Alignment: Maintained with straightening of lordosis noted.   Skull base and vertebrae: No acute fracture. No primary  bone lesion or focal pathologic process.   Soft tissues and spinal canal: No prevertebral fluid or swelling. No visible  canal hematoma.   Disc levels:  Negative.   Upper chest: Lung apices clear.   Other: None.   IMPRESSION: No acute abnormality head or cervical spine. Complete opacification of the left sphenoid sinus noted.  Review of Systems: Patient complains of symptoms per HPI as well as the following symptoms headache. Pertinent negatives and positives per HPI. All others negative.   Social History   Socioeconomic History   Marital status: Single    Spouse name: Not on file   Number of children: Not on file   Years of education: Not on file   Highest education level: Not on file  Occupational History   Not on file  Tobacco Use   Smoking status: Former    Packs/day: 0.50    Years: 10.00    Pack years: 5.00    Types: Cigarettes    Quit date: 07/27/2020    Years since quitting: 0.5   Smokeless tobacco: Never  Vaping Use   Vaping Use: Never used  Substance and Sexual Activity   Alcohol use: Yes    Comment: occ   Drug use: No   Sexual activity: Yes  Other Topics Concern   Not on file  Social History Narrative   Coffee every now and then,  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  Education : Teaching laboratory technician.  Family single.    Social Determinants of Health   Financial Resource Strain: Not on file  Food Insecurity: Not on file  Transportation Needs: Not on file  Physical Activity: Not on file  Stress: Not on file  Social Connections: Not on file  Intimate Partner Violence: Not on file    Family History  Problem Relation Age of Onset   Hypertension Mother    Stroke Mother    Hypertension Father    Diabetes Sister    Hypertension Sister    Cancer Sister    Diabetes Sister    Hypertension Brother    Cancer Maternal Aunt    Hypertension Maternal Aunt    Hypertension Maternal Uncle    Hypertension Paternal Aunt    Hypertension Paternal Uncle    Stroke Maternal Grandmother    Hypertension Maternal Grandmother    Colon cancer Maternal Grandmother 32   Cancer Maternal  Grandfather    Hypertension Maternal Grandfather    Hypertension Paternal Grandmother    Hypertension Paternal Grandfather     Past Medical History:  Diagnosis Date   AAA (abdominal aortic aneurysm)    Allergy    Anemia    Asthma    Blood in stool    Bronchitis    Chicken pox    Chronic headaches    Diabetes mellitus    PT. IS TRYING TO CONTROL WITH DIET BUT AS NOTED WEIGHT IS UP   Hypertension    Obesity    Syncope and collapse 12/22/2016   Tobacco abuse     Patient Active Problem List   Diagnosis Date Noted   Chronic migraine w/o aura w/o status migrainosus, not intractable 11/29/2020   Pulmonary nodule 01/01/2017   Obstructive sleep apnea 06/11/2016   Mixed rhinitis 06/11/2016   Hyperlipidemia associated with type 2 diabetes mellitus (HCC) 01/19/2015   Gallstones 11/18/2014   AAA (abdominal aortic aneurysm)    Vitamin D deficiency 06/07/2014   Physical exam 02/14/2011   Hand pain 02/05/2011   Headache 05/09/2007   Diet-controlled  diabetes mellitus (HCC) 04/04/2007   MORBID OBESITY 04/04/2007   Tobacco user 04/04/2007   Essential hypertension 04/04/2007    Past Surgical History:  Procedure Laterality Date   ABDOMINAL HYSTERECTOMY  1998   CHOLECYSTECTOMY N/A 03/16/2015   Procedure: LAPAROSCOPIC CHOLECYSTECTOMY;  Surgeon: Jimmye Norman, MD;  Location: [facility] OR;  Service: General;  Laterality: N/A;    Current Outpatient Medications  Medication Sig Dispense Refill   amLODipine (NORVASC) 5 MG tablet Take 1 tablet (5 mg total) by mouth daily. 30 tablet 3   APAP-Isometheptene-Dichloral (MIDRIN) 325-65-100 MG CAPS Take 1 capsule by mouth every 6 (six) hours as needed for headache.     atorvastatin (LIPITOR) 10 MG tablet Take 1 tablet (10 mg total) by mouth daily. 90 tablet 1   Azelastine-Fluticasone (DYMISTA) 137-50 MCG/ACT SUSP Place 2 sprays into both nostrils at bedtime. 1 Bottle 0   calcium-vitamin D (OSCAL WITH D) 500-200 MG-UNIT tablet Take 1 tablet by mouth.      cetirizine (ZYRTEC) 10 MG tablet Take 10 mg by mouth daily.     cyclobenzaprine (FLEXERIL) 5 MG tablet Take 1 tablet (5 mg total) by mouth at bedtime. 10 tablet 1   Hyprom-Naphaz-Polysorb-Zn Sulf (CLEAR EYES COMPLETE) SOLN Place 1 drop into both eyes daily as needed (dry eyes).     ipratropium (ATROVENT) 0.06 % nasal spray Place 2 sprays into both nostrils 4 (four) times daily. 15 mL 0   Lifitegrast (XIIDRA) 5 % SOLN Place 1 drop into both eyes 2 times a day 60 each PRN   losartan-hydrochlorothiazide (HYZAAR) 100-12.5 MG tablet Take 1 tablet by mouth daily. 90 tablet 0   methocarbamol (ROBAXIN) 500 MG tablet Take 1 tablet (500 mg total) by mouth every 8 (eight) hours as needed. 30 tablet 0   Multiple Vitamins-Minerals (MULTIVITAMIN WITH MINERALS) tablet Take 1 tablet by mouth daily.     nicotine polacrilex (COMMIT) 2 MG lozenge Take 2 mg by mouth as needed for smoking cessation.     ondansetron (ZOFRAN ODT) 4 MG disintegrating tablet Take 1 tablet (4 mg total) by mouth every 8 (eight) hours as needed for nausea or vomiting. 20 tablet 0   ondansetron (ZOFRAN-ODT) 4 MG disintegrating tablet Dissolve 1-2 tablets (4-8 mg total) by mouth every 8 (eight) hours as needed. 30 tablet 3   rizatriptan (MAXALT-MLT) 10 MG disintegrating tablet Dissolve 1 tablet (10 mg total) by mouth as needed for migraine. May repeat in 2 hours if needed 9 tablet 11   topiramate (TOPAMAX) 25 MG tablet Start with one tablet by mouth at bedtime. In 2 weeks may increase to 2 tablets at bedtime (50mg ) 60 tablet 3   VENTOLIN HFA 108 (90 Base) MCG/ACT inhaler INHALE 2 PUFFS INTO THE LUNGS EVERY 4 HOURS AS NEEDED FOR WHEEZING OR SHORTNESS OF BREATH. (Patient taking differently: Inhale 2 puffs into the lungs every 4 (four) hours as needed for wheezing or shortness of breath.) 18 g 6   zolpidem (AMBIEN) 5 MG tablet Take 1 tablet (5 mg total) by mouth at bedtime as needed for sleep. 30 tablet 1   No current facility-administered  medications for this visit.    Allergies as of 03/06/2021   (No Known Allergies)    Vitals: LMP 11/10/2010  Last Weight:  Wt Readings from Last 1 Encounters:  01/26/21 259 lb 6.4 oz (117.7 kg)   Last Height:   Ht Readings from Last 1 Encounters:  01/26/21 5' 3.5" (1.613 m)     Physical exam: Exam: Gen:  NAD, conversant, well nourised, obese, well groomed                        Assessment/Plan: This is a patient with possibly chronic migraines, difficult to exactly pinpoint her many migraine and headache days, asked her to keep a journal, we discussed migraines,, medications for migraine management, lifestyle, provided her with much documentation to read about migraine management.  We also talked about lots of the new medications available but need to try some of the more traditional first.  Interval history 03/06/2021: patient doing great, no problems, continue current regimen. No charge, <2 minute phone call  Start Topiramate at bedtime for prevention Acute/Emergency: Take Rizatriptan. Please take one tablet at the onset of your headache. If it does not improve the symptoms please take one additional tablet. Do not take more then 2 tablets in 24hrs. Do not take use more then 2 to 3 times in a week. Can take with Ondansetron.  Lots of new medications: Dimas Millin, Ajovy, Emgality. Qulipta.  If the above does not work, I will change her to 1 of these new medications.  CT head normal, low threshold for MRI  No orders of the defined types were placed in this encounter.   Cc: Sheliah Hatch, MD,  Sheliah Hatch, MD  Naomie Dean, MD  [facility] [address] [place], [place] [address]  Phone [phone] Fax [phone]

## 2021-02-27 NOTE — Telephone Encounter (Signed)
Dr. Lucia Gaskins wanted to see if this patient wanted to do a VV visit instead of coming into the office, at the same time. I called and LVM for patient to call us back it she would like it to be switched to VV, if not it's ok to keep in person. Also sent mychart message with the same information.

## 2021-03-06 ENCOUNTER — Telehealth (INDEPENDENT_AMBULATORY_CARE_PROVIDER_SITE_OTHER): Payer: Self-pay | Admitting: Neurology

## 2021-03-06 ENCOUNTER — Telehealth: Payer: Self-pay | Admitting: Neurology

## 2021-03-06 DIAGNOSIS — G43709 Chronic migraine without aura, not intractable, without status migrainosus: Secondary | ICD-10-CM

## 2021-03-06 NOTE — Telephone Encounter (Signed)
Can one of you call and schedule her with NP in 6-9 months, doing great, can be video or phone

## 2021-03-15 ENCOUNTER — Other Ambulatory Visit: Payer: Self-pay | Admitting: Family Medicine

## 2021-03-15 DIAGNOSIS — Z1231 Encounter for screening mammogram for malignant neoplasm of breast: Secondary | ICD-10-CM

## 2021-03-16 ENCOUNTER — Other Ambulatory Visit (HOSPITAL_COMMUNITY): Payer: Self-pay

## 2021-03-16 ENCOUNTER — Other Ambulatory Visit: Payer: Self-pay | Admitting: Family Medicine

## 2021-03-16 MED ORDER — LOSARTAN POTASSIUM-HCTZ 100-12.5 MG PO TABS
1.0000 | ORAL_TABLET | Freq: Every day | ORAL | 0 refills | Status: DC
  Filled 2021-03-16 – 2021-04-07 (×2): qty 90, 90d supply, fill #0

## 2021-03-24 ENCOUNTER — Other Ambulatory Visit (HOSPITAL_COMMUNITY): Payer: Self-pay

## 2021-04-07 ENCOUNTER — Other Ambulatory Visit (HOSPITAL_COMMUNITY): Payer: Self-pay

## 2021-04-14 ENCOUNTER — Ambulatory Visit
Admission: RE | Admit: 2021-04-14 | Discharge: 2021-04-14 | Disposition: A | Discharge status: Home / Self Care | Payer: 59 | Source: Ambulatory Visit | Attending: Family Medicine | Admitting: Family Medicine

## 2021-04-14 DIAGNOSIS — Z1231 Encounter for screening mammogram for malignant neoplasm of breast: Secondary | ICD-10-CM | POA: Diagnosis not present

## 2021-05-10 ENCOUNTER — Other Ambulatory Visit (HOSPITAL_COMMUNITY): Payer: Self-pay

## 2021-05-17 ENCOUNTER — Other Ambulatory Visit (HOSPITAL_BASED_OUTPATIENT_CLINIC_OR_DEPARTMENT_OTHER): Payer: Self-pay

## 2021-05-17 ENCOUNTER — Ambulatory Visit: Payer: 59 | Attending: Internal Medicine

## 2021-05-17 DIAGNOSIS — Z23 Encounter for immunization: Secondary | ICD-10-CM

## 2021-05-17 MED ORDER — PFIZER COVID-19 VAC BIVALENT 30 MCG/0.3ML IM SUSP
INTRAMUSCULAR | 0 refills | Status: DC
  Filled 2021-05-17: qty 0.3, 1d supply, fill #0

## 2021-05-17 NOTE — Progress Notes (Signed)
° °  Covid-19 Vaccination [facility]  Name:  Ashlee Williams    MRN: [id number] DOB: October 05, 1955  05/17/2021  [name] was observed post Covid-19 immunization for 15 minutes without incident. She was provided with Vaccine Information Sheet and instruction to access the V-Safe system.   [name] was instructed to call 911 with any severe reactions post vaccine: Difficulty breathing  Swelling of face and throat  A fast heartbeat  A bad rash all over body  Dizziness and weakness   Immunizations Administered     Name Date Dose VIS Date Route   Pfizer Covid-19 Vaccine Bivalent Booster 05/17/2021  9:16 AM 0.3 mL 12/07/2020 Intramuscular   Manufacturer: [employer], [employer]   Lot: [id number]   NDC: [phone]

## 2021-06-15 ENCOUNTER — Other Ambulatory Visit (HOSPITAL_COMMUNITY): Payer: Self-pay

## 2021-06-26 ENCOUNTER — Other Ambulatory Visit (HOSPITAL_COMMUNITY): Payer: Self-pay

## 2021-07-18 ENCOUNTER — Ambulatory Visit: Payer: 59 | Admitting: Family Medicine

## 2021-07-19 ENCOUNTER — Other Ambulatory Visit: Payer: Self-pay | Admitting: Family Medicine

## 2021-07-19 ENCOUNTER — Other Ambulatory Visit (HOSPITAL_COMMUNITY): Payer: Self-pay

## 2021-07-19 MED ORDER — LOSARTAN POTASSIUM-HCTZ 100-12.5 MG PO TABS
1.0000 | ORAL_TABLET | Freq: Every day | ORAL | 0 refills | Status: DC
  Filled 2021-07-19: qty 90, 90d supply, fill #0

## 2021-07-25 ENCOUNTER — Ambulatory Visit: Payer: 59 | Admitting: Family Medicine

## 2021-07-27 ENCOUNTER — Other Ambulatory Visit: Payer: Self-pay | Admitting: Family Medicine

## 2021-07-28 ENCOUNTER — Other Ambulatory Visit (HOSPITAL_COMMUNITY): Payer: Self-pay

## 2021-07-28 MED ORDER — AMLODIPINE BESYLATE 5 MG PO TABS
5.0000 mg | ORAL_TABLET | Freq: Every day | ORAL | 3 refills | Status: DC
  Filled 2021-07-28: qty 30, 30d supply, fill #0
  Filled 2021-08-30: qty 30, 30d supply, fill #1
  Filled 2021-10-15: qty 30, 30d supply, fill #2
  Filled 2021-11-21: qty 30, 30d supply, fill #3

## 2021-08-03 ENCOUNTER — Encounter: Payer: Self-pay | Admitting: Family Medicine

## 2021-08-03 ENCOUNTER — Ambulatory Visit: Payer: 59 | Admitting: Family Medicine

## 2021-08-03 VITALS — BP 124/72 | HR 74 | Temp 97.7°F | Resp 18 | Ht 63.5 in | Wt 258.2 lb

## 2021-08-03 DIAGNOSIS — E785 Hyperlipidemia, unspecified: Secondary | ICD-10-CM

## 2021-08-03 DIAGNOSIS — E1169 Type 2 diabetes mellitus with other specified complication: Secondary | ICD-10-CM | POA: Diagnosis not present

## 2021-08-03 DIAGNOSIS — E119 Type 2 diabetes mellitus without complications: Secondary | ICD-10-CM | POA: Diagnosis not present

## 2021-08-03 DIAGNOSIS — I10 Essential (primary) hypertension: Secondary | ICD-10-CM | POA: Diagnosis not present

## 2021-08-03 NOTE — Progress Notes (Signed)
? ?  Subjective:  ? ? Patient ID: Ashlee Williams, female    DOB: [birth year], 66 y.o.   MRN: [id number] ? ?HPI ?HTN- chronic problem, on Amlodipine 5mg  daily, Losartan HCTZ 100/12.5mg  daily w/ good control.  No CP, SOB, HAs, visual changes, edema. ? ?Hyperlipidemia- chronic problem, on Lipitor 10mg  daily.  No abd pain, N/V. ? ?DM- chronic problem, has been able to control w/ diet.  UTD on eye exam, foot exam.  Due for microalbutmin.  No numbness/tingling of hands/feet.  + fatigue ? ?Obesity- pt's BMI is 45.03 ? ? ?Review of Systems ?For ROS see HPI  ?   ?Objective:  ? Physical Exam ?Vitals reviewed.  ?Constitutional:   ?   General: She is not in acute distress. ?   Appearance: Normal appearance. She is well-developed. She is obese. She is not ill-appearing.  ?HENT:  ?   Head: Normocephalic and atraumatic.  ?Eyes:  ?   Conjunctiva/sclera: Conjunctivae normal.  ?   Pupils: Pupils are equal, round, and reactive to light.  ?Neck:  ?   Thyroid: No thyromegaly.  ?Cardiovascular:  ?   Rate and Rhythm: Normal rate and regular rhythm.  ?   Pulses: Normal pulses.  ?   Heart sounds: Normal heart sounds. No murmur heard. ?Pulmonary:  ?   Effort: Pulmonary effort is normal. No respiratory distress.  ?   Breath sounds: Normal breath sounds.  ?Abdominal:  ?   General: There is no distension.  ?   Palpations: Abdomen is soft.  ?   Tenderness: There is no abdominal tenderness.  ?Musculoskeletal:  ?   Cervical back: Normal range of motion and neck supple.  ?   Right lower leg: No edema.  ?   Left lower leg: No edema.  ?Lymphadenopathy:  ?   Cervical: No cervical adenopathy.  ?Skin: ?   General: Skin is warm and dry.  ?Neurological:  ?   Mental Status: She is alert and oriented to person, place, and time.  ?Psychiatric:     ?   Behavior: Behavior normal.  ? ? ? ? ? ?   ?Assessment & Plan:  ? ? ?

## 2021-08-03 NOTE — Patient Instructions (Signed)
Schedule your complete physical in 6 months ?We'll notify you of your lab results and make any changes if needed ?Continue to work on healthy diet and regular exercise- you can do it!!! ?Restart the CPAP nightly and see if this improves fatigue ?Call with any questions or concerns ?Stay Safe! Stay Healthy! ?Happy Spring!!! ?

## 2021-08-04 LAB — CBC WITH DIFFERENTIAL/PLATELET
Basophils Absolute: 0 10*3/uL (ref 0.0–0.1)
Basophils Relative: 0.7 % (ref 0.0–3.0)
Eosinophils Absolute: 0.1 10*3/uL (ref 0.0–0.7)
Eosinophils Relative: 1.9 % (ref 0.0–5.0)
HCT: 39.1 % (ref 36.0–46.0)
Hemoglobin: 12.6 g/dL (ref 12.0–15.0)
Lymphocytes Relative: 39.5 % (ref 12.0–46.0)
Lymphs Abs: 2.2 10*3/uL (ref 0.7–4.0)
MCHC: 32.3 g/dL (ref 30.0–36.0)
MCV: 83.3 fl (ref 78.0–100.0)
Monocytes Absolute: 0.5 10*3/uL (ref 0.1–1.0)
Monocytes Relative: 8.3 % (ref 3.0–12.0)
Neutro Abs: 2.7 10*3/uL (ref 1.4–7.7)
Neutrophils Relative %: 49.6 % (ref 43.0–77.0)
Platelets: 189 10*3/uL (ref 150.0–400.0)
RBC: 4.7 Mil/uL (ref 3.87–5.11)
RDW: 15.3 % (ref 11.5–15.5)
WBC: 5.5 10*3/uL (ref 4.0–10.5)

## 2021-08-04 LAB — HEPATIC FUNCTION PANEL
ALT: 12 U/L (ref 0–35)
AST: 13 U/L (ref 0–37)
Albumin: 3.9 g/dL (ref 3.5–5.2)
Alkaline Phosphatase: 61 U/L (ref 39–117)
Bilirubin, Direct: 0.1 mg/dL (ref 0.0–0.3)
Total Bilirubin: 0.5 mg/dL (ref 0.2–1.2)
Total Protein: 6.8 g/dL (ref 6.0–8.3)

## 2021-08-04 LAB — BASIC METABOLIC PANEL
BUN: 21 mg/dL (ref 6–23)
CO2: 31 mEq/L (ref 19–32)
Calcium: 9.4 mg/dL (ref 8.4–10.5)
Chloride: 106 mEq/L (ref 96–112)
Creatinine, Ser: 0.86 mg/dL (ref 0.40–1.20)
GFR: 71.27 mL/min (ref >60.00)
Glucose, Bld: 104 mg/dL — ABNORMAL HIGH (ref 70–99)
Potassium: 4.1 mEq/L (ref 3.5–5.1)
Sodium: 143 mEq/L (ref 135–145)

## 2021-08-04 LAB — LIPID PANEL
Cholesterol: 117 mg/dL (ref 0–200)
HDL: 48.7 mg/dL (ref >39.00)
LDL Cholesterol: 53 mg/dL (ref 0–99)
NonHDL: 68.68
Total CHOL/HDL Ratio: 2
Triglycerides: 78 mg/dL (ref 0.0–149.0)
VLDL: 15.6 mg/dL (ref 0.0–40.0)

## 2021-08-04 LAB — MICROALBUMIN / CREATININE URINE RATIO
Creatinine,U: 200.2 mg/dL
Microalb Creat Ratio: 0.3 mg/g (ref 0.0–30.0)
Microalb, Ur: <0.7 mg/dL (ref 0.0–1.9)

## 2021-08-04 LAB — TSH: TSH: 0.96 u[IU]/mL (ref 0.35–5.50)

## 2021-08-04 LAB — HEMOGLOBIN A1C: Hgb A1c MFr Bld: 6.3 % (ref 4.6–6.5)

## 2021-08-06 NOTE — Assessment & Plan Note (Signed)
Chronic problem.  Has been diet controlled.  UTD on eye exam, foot exam.  Microalbumin ordered.  Currently asymptomatic despite fatigue.  Check labs and determine if meds needed ?

## 2021-08-06 NOTE — Assessment & Plan Note (Signed)
Chronic problem.  Tolerating Lipitor w/o difficulty.  Check labs.  Adjust meds prn  

## 2021-08-06 NOTE — Assessment & Plan Note (Signed)
Chronic problem.  Currently well controlled on Amlodipine 5mg  daily, Losartan HCTZ 100/12.5mg  daily.  Asymptomatic.  Check labs due to ARB and HCTZ.  No anticipated med changes.  Will follow. ?

## 2021-08-06 NOTE — Assessment & Plan Note (Signed)
Ongoing issue.  BMI now 45.03  Discussed need for healthy diet and regular exercise.  Will continue to follow. ?

## 2021-08-07 ENCOUNTER — Telehealth: Payer: Self-pay

## 2021-08-07 NOTE — Telephone Encounter (Signed)
-----   Message from Midge Minium, MD sent at 08/06/2021  8:26 PM EDT ----- ?Labs look great!  No changes at this time ?

## 2021-08-07 NOTE — Telephone Encounter (Signed)
Left vm for patient letting her know about labs and that they were accessible on my chart. Asked that she call back with any questions.  ?

## 2021-08-24 DIAGNOSIS — H2513 Age-related nuclear cataract, bilateral: Secondary | ICD-10-CM | POA: Diagnosis not present

## 2021-08-24 DIAGNOSIS — H04123 Dry eye syndrome of bilateral lacrimal glands: Secondary | ICD-10-CM | POA: Diagnosis not present

## 2021-08-24 LAB — HM DIABETES EYE EXAM

## 2021-08-30 ENCOUNTER — Other Ambulatory Visit (HOSPITAL_COMMUNITY): Payer: Self-pay

## 2021-09-05 ENCOUNTER — Telehealth: Payer: 59 | Admitting: Family Medicine

## 2021-09-05 NOTE — Progress Notes (Deleted)
PATIENT: Ashlee Williams DOB: 1955/07/30  REASON FOR VISIT: follow up HISTORY FROM: patient  Virtual Visit via Telephone Note  I connected with Ashlee Williams on 09/05/21 at  9:15 AM EDT by telephone and verified that I am speaking with the correct person using two identifiers.   I discussed the limitations, risks, security and privacy concerns of performing an evaluation and management service by telephone and the availability of in person appointments. I also discussed with the patient that there may be a patient responsible charge related to this service. The patient expressed understanding and agreed to proceed.   History of Present Illness:  09/05/21 ALL: Ashlee Williams is a 66 y.o. female here today for follow up for migraines. She continues topiramate 50mg  daily and rizatriptan and ondansetron as needed.    History (copied from Dr previous note)  HPI:  Ashlee Williams is a 66 y.o. female here as requested by 78, MD for Migraine. PMHx asthma, diabetes,HTN, obesity, tobacco abuse.    Headaches started worsening September 2021 so almost a year gao, but prior to that would have them occasionally. Start on the left side of the forehead then radiate to the ear, pulsating/pounding/throbbing.photo/phonophobia, nausea, no vomiting, light bother bother, a dark room would help, can last 24 hours. Started in the setting of stress and family death. Some days they are worse, an alleve may help, they are severe in pain, moving makes it worse. Can wake with them. Can be positionally worse. Can also happen during the course of the day. She did have sleep apnea, she lost weight and doesn;t need cpap anymore, 30 pounds. Not tired during the day, feels sleep apnea is resolved. 8 migraine days a months, 15 headache days a month. No other focal neurologic deficits, associated symptoms, inciting events or modifiable factors.   Reviewed notes, labs  and imaging from outside physicians, which showed:   Hemoglobin A1c 6.5, CBC normal, BMP normal, TSH normal June 2022   From a thorough review of records, medications tried include: Tylenol, amlodipine (this is a calcium channel blocker such as verapamil), fioricet,flexeril, robaxin, reglan, naproxen, medro dosepak, zofran, prednisone, valsartan, amitriptyline, propranolol contraindicated due to asthma   CT 08/2018 personally reviewed images and agree with the following: TECHNIQUE: Multidetector CT imaging of the head and cervical spine was performed following the standard protocol without intravenous contrast. Multiplanar CT image reconstructions of the cervical spine were also generated.   COMPARISON:  Head CT 12/22/2016.   FINDINGS: CT HEAD FINDINGS   Brain: No evidence of acute infarction, hemorrhage, hydrocephalus, extra-axial collection or mass lesion/mass effect.   Vascular: No hyperdense vessel or unexpected calcification.   Skull: Normal. Negative for fracture or focal lesion.   Sinuses/Orbits: The left sphenoid sinus is completely opacified. Otherwise negative.   Other: None.   CT CERVICAL SPINE FINDINGS   Alignment: Maintained with straightening of lordosis noted.   Skull base and vertebrae: No acute fracture. No primary bone lesion or focal pathologic process.   Soft tissues and spinal canal: No prevertebral fluid or swelling. No visible canal hematoma.   Disc levels:  Negative.   Upper chest: Lung apices clear.   Other: None.   IMPRESSION: No acute abnormality head or cervical spine. Complete opacification of the left sphenoid sinus noted.   Observations/Objective:  Generalized: Well developed, in no acute distress  Mentation: Alert oriented to time, place, history taking. Follows all commands speech and language fluent   Assessment and  Plan:  66 y.o. year old female  has a past medical history of AAA (abdominal aortic aneurysm) (HCC), Allergy,  Anemia, Asthma, Blood in stool, Bronchitis, Chicken pox, Chronic headaches, Diabetes mellitus, Hypertension, Obesity, Syncope and collapse (12/22/2016), and Tobacco abuse. here with  No diagnosis found.  No orders of the defined types were placed in this encounter.   No orders of the defined types were placed in this encounter.    Follow Up Instructions:  I discussed the assessment and treatment plan with the patient. The patient was provided an opportunity to ask questions and all were answered. The patient agreed with the plan and demonstrated an understanding of the instructions.   The patient was advised to call back or seek an in-person evaluation if the symptoms worsen or if the condition fails to improve as anticipated.  I provided *** minutes of non-face-to-face time during this encounter. Patient located at their place of residence during Mychart visit. Provider is in the office.    Shawnie Dapper, NP

## 2021-09-05 NOTE — Patient Instructions (Incomplete)

## 2021-09-28 ENCOUNTER — Encounter: Payer: Self-pay | Admitting: Family Medicine

## 2021-09-28 ENCOUNTER — Ambulatory Visit: Payer: 59 | Admitting: Family Medicine

## 2021-09-28 ENCOUNTER — Other Ambulatory Visit (HOSPITAL_COMMUNITY): Payer: Self-pay

## 2021-09-28 VITALS — BP 132/84 | HR 69 | Temp 98.0°F | Resp 16 | Ht 63.5 in | Wt 269.1 lb

## 2021-09-28 DIAGNOSIS — M79605 Pain in left leg: Secondary | ICD-10-CM | POA: Diagnosis not present

## 2021-09-28 DIAGNOSIS — J209 Acute bronchitis, unspecified: Secondary | ICD-10-CM | POA: Diagnosis not present

## 2021-09-28 MED ORDER — AZITHROMYCIN 250 MG PO TABS
ORAL_TABLET | ORAL | 0 refills | Status: DC
  Filled 2021-09-28: qty 6, 5d supply, fill #0

## 2021-09-28 MED ORDER — PREDNISONE 10 MG PO TABS
ORAL_TABLET | ORAL | 0 refills | Status: DC
  Filled 2021-09-28: qty 18, 9d supply, fill #0

## 2021-09-28 NOTE — Patient Instructions (Signed)
Follow up as needed or as scheduled START the Zpack as directed TAKE the Prednisone as directed- take w/ food AVOID ibuprofen, Motrin, Advil, Aleve etc while on the Prednisone You CAN add Tylenol as needed for pain Call with any questions or concerns Hang in there!!!

## 2021-09-28 NOTE — Progress Notes (Incomplete)
   Subjective:    Patient ID: Ashlee Williams, female    DOB: 07-17-1955, 66 y.o.   MRN: [id number]  HPI L leg pain- sxs started 4 days ago.  Pain will start in lateral hip and radiate all the way down to ankle.  Difficult to sit/stand/lie.  No known injury.  No change in activity level.  No relief w/ Advil.  Pain is worse w/ prolonged sitting or lying down.  Reports no TTP but at times hip will feel 'a little warm'.  No swelling.  Nasal congestion- sxs started last week.  Today is ~day 10.  Pt reports she now has both nasal and chest congestion.  No fevers.  Denies sinus pain/pressure.  Cough is productive of yellow sputum.  Denies SOB.  No longer a smoker but does have hx of recurrent bronchitis.     Review of Systems For ROS see HPI     Objective:   Physical Exam        Assessment & Plan:

## 2021-10-16 ENCOUNTER — Other Ambulatory Visit (HOSPITAL_COMMUNITY): Payer: Self-pay

## 2021-11-05 ENCOUNTER — Other Ambulatory Visit: Payer: Self-pay | Admitting: Family Medicine

## 2021-11-06 ENCOUNTER — Other Ambulatory Visit (HOSPITAL_COMMUNITY): Payer: Self-pay

## 2021-11-06 MED ORDER — LOSARTAN POTASSIUM-HCTZ 100-12.5 MG PO TABS
1.0000 | ORAL_TABLET | Freq: Every day | ORAL | 0 refills | Status: DC
  Filled 2021-11-06: qty 90, 90d supply, fill #0

## 2021-11-21 ENCOUNTER — Other Ambulatory Visit (HOSPITAL_COMMUNITY): Payer: Self-pay

## 2021-11-23 ENCOUNTER — Ambulatory Visit (INDEPENDENT_AMBULATORY_CARE_PROVIDER_SITE_OTHER): Payer: 59 | Admitting: Orthopedic Surgery

## 2021-11-23 ENCOUNTER — Encounter: Payer: Self-pay | Admitting: Orthopedic Surgery

## 2021-11-23 ENCOUNTER — Encounter: Payer: Self-pay | Admitting: Family Medicine

## 2021-11-23 ENCOUNTER — Ambulatory Visit: Payer: 59 | Admitting: Family Medicine

## 2021-11-23 ENCOUNTER — Ambulatory Visit: Payer: Self-pay

## 2021-11-23 VITALS — BP 126/80 | HR 65 | Temp 97.1°F | Resp 16 | Ht 63.5 in | Wt 260.2 lb

## 2021-11-23 DIAGNOSIS — S63639A Sprain of interphalangeal joint of unspecified finger, initial encounter: Secondary | ICD-10-CM

## 2021-11-23 DIAGNOSIS — S63633A Sprain of interphalangeal joint of left middle finger, initial encounter: Secondary | ICD-10-CM

## 2021-11-23 NOTE — Progress Notes (Signed)
Office Visit Note   Patient: Ashlee Williams           Date of Birth: [birth year]           MRN: [id number] Visit Date: 11/23/2021              Requested by: Sheliah Hatch, MD [address] [place],  [place] [address] PCP: Sheliah Hatch, MD   Assessment & Plan: Visit Diagnoses:  1. Traumatic rupture of tendon of distal interphalangeal (DIP) joint of finger, initial encounter     Plan: Patient is one week out from a closed left middle finger FDP avulsion injury.  There is no associated bony fragment on x-ray.  We reviewed the nature of this injury including both surgical repair and non surgical treatment with hand therapy.  We reviewed the recent literature which showed similar outcomes in terms of hand function with both treatment options.  We reviewed the risks of surgery including bleeding, infection, damage to neurovascular structures, stiffness, failure of the repair, incomplete pain relief, and the need for additional surgery.  Patient would like to proceed with suture anchor repair. A surgical date and time will be confirmed with the patient.   Follow-Up Instructions: No follow-ups on file.   Orders:  Orders Placed This Encounter  Procedures   XR Finger Middle Left   No orders of the defined types were placed in this encounter.     Procedures: No procedures performed   Clinical Data: No additional findings.   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  Patient was putting her tissues 1 week ago when she pulled on the back of the shoe and felt a pop and pulling sensation in her middle finger.  She has since been able to flex the finger.  Her pain can be as bad as 8/10 in her forearm with attempted motion of the finger.  She denies any previous injury to his finger.   She denies pain or loss of function elsewhere in the hand.  Injury    Review of Systems   Objective: Vital Signs: LMP 11/10/2010   Physical Exam Constitutional:      Appearance: Normal appearance.  Cardiovascular:     Rate and Rhythm: Normal rate.     Pulses: Normal pulses.  Pulmonary:     Effort: Pulmonary effort is normal.  Skin:    General: Skin is warm and dry.     Capillary Refill: Capillary refill takes less than 2 seconds.  Neurological:     Mental Status: She is alert.     Left Hand Exam   Tenderness  Left hand tenderness location: TTP along flexor sheath of the middle finger.   Other  Erythema: absent Sensation: normal Pulse: present  Comments:  Intact isolated FDS function to middle finger.  No active FDP function.       Specialty Comments:  No specialty comments available.  Imaging: No results found.   PMFS History: Patient Active Problem List   Diagnosis Date Noted   Chronic migraine w/o aura w/o status migrainosus, not intractable 11/29/2020   Pulmonary nodule 01/01/2017   Obstructive sleep apnea 06/11/2016   Mixed rhinitis 06/11/2016   Hyperlipidemia associated with type 2 diabetes mellitus (HCC) 01/19/2015   Gallstones 11/18/2014   AAA (  abdominal aortic aneurysm) (HCC)    Vitamin D deficiency 06/07/2014   Physical exam 02/14/2011   Hand pain 02/05/2011   Headache 05/09/2007   Diet-controlled diabetes mellitus (HCC) 04/04/2007   MORBID OBESITY 04/04/2007   Tobacco user 04/04/2007   Essential hypertension 04/04/2007   Past Medical History:  Diagnosis Date   AAA (abdominal aortic aneurysm) (HCC)    Allergy    Anemia    Asthma    Blood in stool    Bronchitis    Chicken pox    Chronic headaches    Diabetes mellitus    PT. IS TRYING TO CONTROL WITH DIET BUT AS NOTED WEIGHT IS UP   Hypertension    Obesity    Syncope and collapse 12/22/2016   Tobacco abuse     Family History  Problem Relation Age of Onset   Hypertension  Mother    Stroke Mother    Hypertension Father    Diabetes Sister    Hypertension Sister    Cancer Sister    Diabetes Sister    Hypertension Brother    Cancer Maternal Aunt    Hypertension Maternal Aunt    Hypertension Maternal Uncle    Hypertension Paternal Aunt    Hypertension Paternal Uncle    Stroke Maternal Grandmother    Hypertension Maternal Grandmother    Colon cancer Maternal Grandmother 30   Cancer Maternal Grandfather    Hypertension Maternal Grandfather    Hypertension Paternal Grandmother    Hypertension Paternal Grandfather     Past Surgical History:  Procedure Laterality Date   ABDOMINAL HYSTERECTOMY  04/09/1996   BREAST BIOPSY Right 12/07/2019   axilla/ neg   BREAST BIOPSY Right 12/07/2019   neg   CHOLECYSTECTOMY N/A 03/16/2015   Procedure: LAPAROSCOPIC CHOLECYSTECTOMY;  Surgeon: Jimmye Norman, MD;  Location: [facility] OR;  Service: General;  Laterality: N/A;   Social History   Occupational History   Not on file  Tobacco Use   Smoking status: Former    Packs/day: 0.50    Years: 10.00    Total pack years: 5.00    Types: Cigarettes    Quit date: 07/27/2020    Years since quitting: 1.3   Smokeless tobacco: Never  Vaping Use   Vaping Use: Never used  Substance and Sexual Activity   Alcohol use: Yes    Comment: occ   Drug use: No   Sexual activity: Yes

## 2021-11-23 NOTE — Progress Notes (Signed)
   Subjective:    Patient ID: Ashlee Williams, female    DOB: 24-Oct-1955, 66 y.o.   MRN: [id number]  HPI L hand pain- pt reports she was pulling on a sneaker by the tab in the back and she felt something pull in her hand.  This was 1 week ago.  Now not able to bend middle finger, pain will shoot up into arm.     Review of Systems For ROS see HPI     Objective:   Physical Exam Vitals reviewed.  Constitutional:      General: She is not in acute distress.    Appearance: Normal appearance. She is not ill-appearing.  Cardiovascular:     Pulses: Normal pulses.  Musculoskeletal:     Comments: Inability to flex PIP and DIP joints of L middle finger  Skin:    General: Skin is warm and dry.     Findings: No bruising or erythema.  Neurological:     Mental Status: She is alert.           Assessment & Plan:  [place] finger- new.  Reviewed dx w/ pt and need to see a hand specialist.  She was placed in a finger splint for comfort.  Referral was placed as urgent since injury was already 1 week ago.  Pt expressed understanding and is in agreement w/ plan.

## 2021-11-23 NOTE — H&P (View-Only) (Signed)
Office Visit Note   Patient: Ashlee Williams           Date of Birth: [birth year]           MRN: [id number] Visit Date: 11/23/2021              Requested by: Sheliah Hatch, MD [address] [place],  [place] [address] PCP: Sheliah Hatch, MD   Assessment & Plan: Visit Diagnoses:  1. Traumatic rupture of tendon of distal interphalangeal (DIP) joint of finger, initial encounter     Plan: Patient is one week out from a closed left middle finger FDP avulsion injury.  There is no associated bony fragment on x-ray.  We reviewed the nature of this injury including both surgical repair and non surgical treatment with hand therapy.  We reviewed the recent literature which showed similar outcomes in terms of hand function with both treatment options.  We reviewed the risks of surgery including bleeding, infection, damage to neurovascular structures, stiffness, failure of the repair, incomplete pain relief, and the need for additional surgery.  Patient would like to proceed with suture anchor repair. A surgical date and time will be confirmed with the patient.   Follow-Up Instructions: No follow-ups on file.   Orders:  Orders Placed This Encounter  Procedures   XR Finger Middle Left   No orders of the defined types were placed in this encounter.     Procedures: No procedures performed   Clinical Data: No additional findings.   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  Patient was putting her tissues 1 week ago when she pulled on the back of the shoe and felt a pop and pulling sensation in her middle finger.  She has since been able to flex the finger.  Her pain can be as bad as 8/10 in her forearm with attempted motion of the finger.  She denies any previous injury to his finger.   She denies pain or loss of function elsewhere in the hand.  Injury    Review of Systems   Objective: Vital Signs: LMP 11/10/2010   Physical Exam Constitutional:      Appearance: Normal appearance.  Cardiovascular:     Rate and Rhythm: Normal rate.     Pulses: Normal pulses.  Pulmonary:     Effort: Pulmonary effort is normal.  Skin:    General: Skin is warm and dry.     Capillary Refill: Capillary refill takes less than 2 seconds.  Neurological:     Mental Status: She is alert.     Left Hand Exam   Tenderness  Left hand tenderness location: TTP along flexor sheath of the middle finger.   Other  Erythema: absent Sensation: normal Pulse: present  Comments:  Intact isolated FDS function to middle finger.  No active FDP function.       Specialty Comments:  No specialty comments available.  Imaging: No results found.   PMFS History: Patient Active Problem List   Diagnosis Date Noted   Chronic migraine w/o aura w/o status migrainosus, not intractable 11/29/2020   Pulmonary nodule 01/01/2017   Obstructive sleep apnea 06/11/2016   Mixed rhinitis 06/11/2016   Hyperlipidemia associated with type 2 diabetes mellitus (HCC) 01/19/2015   Gallstones 11/18/2014   AAA (  abdominal aortic aneurysm) (HCC)    Vitamin D deficiency 06/07/2014   Physical exam 02/14/2011   Hand pain 02/05/2011   Headache 05/09/2007   Diet-controlled diabetes mellitus (HCC) 04/04/2007   MORBID OBESITY 04/04/2007   Tobacco user 04/04/2007   Essential hypertension 04/04/2007   Past Medical History:  Diagnosis Date   AAA (abdominal aortic aneurysm) (HCC)    Allergy    Anemia    Asthma    Blood in stool    Bronchitis    Chicken pox    Chronic headaches    Diabetes mellitus    PT. IS TRYING TO CONTROL WITH DIET BUT AS NOTED WEIGHT IS UP   Hypertension    Obesity    Syncope and collapse 12/22/2016   Tobacco abuse     Family History  Problem Relation Age of Onset   Hypertension  Mother    Stroke Mother    Hypertension Father    Diabetes Sister    Hypertension Sister    Cancer Sister    Diabetes Sister    Hypertension Brother    Cancer Maternal Aunt    Hypertension Maternal Aunt    Hypertension Maternal Uncle    Hypertension Paternal Aunt    Hypertension Paternal Uncle    Stroke Maternal Grandmother    Hypertension Maternal Grandmother    Colon cancer Maternal Grandmother 85   Cancer Maternal Grandfather    Hypertension Maternal Grandfather    Hypertension Paternal Grandmother    Hypertension Paternal Grandfather     Past Surgical History:  Procedure Laterality Date   ABDOMINAL HYSTERECTOMY  04/09/1996   BREAST BIOPSY Right 12/07/2019   axilla/ neg   BREAST BIOPSY Right 12/07/2019   neg   CHOLECYSTECTOMY N/A 03/16/2015   Procedure: LAPAROSCOPIC CHOLECYSTECTOMY;  Surgeon: James Wyatt, MD;  Location: [facility] OR;  Service: General;  Laterality: N/A;   Social History   Occupational History   Not on file  Tobacco Use   Smoking status: Former    Packs/day: 0.50    Years: 10.00    Total pack years: 5.00    Types: Cigarettes    Quit date: 07/27/2020    Years since quitting: 1.3   Smokeless tobacco: Never  Vaping Use   Vaping Use: Never used  Substance and Sexual Activity   Alcohol use: Yes    Comment: occ   Drug use: No   Sexual activity: Yes        

## 2021-11-23 NOTE — Patient Instructions (Signed)
Follow up as needed or a scheduled We'll call you to schedule your hand appt Keep finger splinted for comfort Call with any questions or concerns Hang in there!!  Flexor Digitorum Profundus Tear  The flexor digitorum profundus (FDP) is a muscle attached to a bone (ulna) in the forearm. The FDP muscle helps to bend the four fingers of the hand. The FDP works together with bands of fibrous tissue (tendons) that attach to the finger joints. A tear (rupture) in the FDP means that a tendon has been separated from a finger joint. If this happens, you will not be able to bend the injured finger. An FDP tear may occur in one or more fingers. It is most common in the ring finger. There are three types of FDP tears: Type I causes the tip of the tendon to draw back (retract) into the palm. Type II causes the tip of the tendon to retract to the finger joint. Type III happens when the rupture tears away a piece of the bone that the tendon is attached to (avulsion fracture). What are the causes? An FDP tear is usually caused by a forceful straightening (extension) of the finger while the FDP muscle is tightened (contracted). This may result from: Grabbing someone's clothing while playing a sport and having it pulled away ([place] finger). Getting a strong pull on a rope or leash that you are holding tightly. Starting a Surveyor, mining or other engine with a pull cord. What increases the risk? The following factors may make you more likely to develop this condition: Playing contact sports. Having poor hand strength and flexibility. Having injured a finger tendon in the past. Having rheumatoid arthritis. What are the signs or symptoms? Symptoms of this condition may include: A feeling like a pop or a rip in the finger at the time of injury. Pain. Inability to bend the finger on its own. Bruising. A lump in the palm of the hand. Numbness in the fingers. This may be a sign that a nerve near the tendon is  damaged. How is this diagnosed? This condition may be diagnosed based on your symptoms and your history of a recent injury. You will also have a physical exam. You may have tests to check for an avulsion fracture, such as: X-rays. A CT scan. An MRI. How is this treated? This condition is treated with surgery. It is best to have surgery soon after the injury. Before surgery, treatment may include: A hand splint. Medicines to help reduce pain and swelling. Keeping your hand raised above the level of your heart (elevated) to reduce swelling. The goal of surgery depends on the type of tear you have. After surgery, treatment includes: A hand splint. Physical therapy to improve finger strength and range of motion. Follow these instructions at home: Medicines Take over-the-counter and prescription medicines only as told by your health care provider. Ask your health care provider if the medicine prescribed to you: Requires you to avoid driving or using heavy machinery. Can cause constipation. You may need to take these actions to prevent or treat constipation: Drink enough fluid to keep your urine pale yellow. Take over-the-counter or prescription medicines. Eat foods that are high in fiber, such as beans, whole grains, and fresh fruits and vegetables. Limit foods that are high in fat and processed sugars, such as fried or sweet foods. If you have a splint: Wear it as told by your health care provider. Remove it only as told by your health care provider.  Loosen the splint if your fingers tingle, become numb, or turn cold and blue. Keep the splint clean. If the splint is not waterproof: Do not let it get wet. Cover it with a watertight covering when you take a bath or shower. Ask your health care provider when it is safe to drive. Managing pain, stiffness, and swelling  If directed, put ice on the injured area. If you have a removable splint, remove it as told by your health care  provider. Put ice in a plastic bag. Place a towel between your skin and the bag or between your splint and the bag. Leave the ice on for 20 minutes, 2-3 times a day. Move your fingers often to reduce stiffness and swelling. Elevate your injured hand while you are sitting or lying down. Activity Do not participate in sports activities until your health care provider approves. Do exercises as told by your health care provider. Return to your normal activities as told by your health care provider. Ask your health care provider what activities are safe for you. General instructions Do not use any products that contain nicotine or tobacco, such as cigarettes, e-cigarettes, and chewing tobacco. These can delay bone healing. If you need help quitting, ask your health care provider. Keep all follow-up visits as told by your health care provider. This is important. Contact a health care provider if: You have pain that gets worse or does not get better with medicine. You have more swelling or numbness in your hand or fingers. Your splint becomes loose or damaged. Get help right away if: You have severe pain. Your hand or fingers turn cold, pale, or blue. Summary A tear (rupture) in the flexor digitorum profundus (FDP) means that a tendon has been separated from a finger joint. If this happens, you will not be able to bend the injured finger. Keep your hand raised above the level of your heart (elevated) to reduce swelling. If you have a splint, wear it as told by your health care provider. This condition is treated with surgery. This information is not intended to replace advice given to you by your health care provider. Make sure you discuss any questions you have with your health care provider. Document Revised: 05/01/2018 Document Reviewed: 05/01/2018 Elsevier Patient Education  2023 [employer].

## 2021-11-27 ENCOUNTER — Encounter (HOSPITAL_BASED_OUTPATIENT_CLINIC_OR_DEPARTMENT_OTHER): Payer: Self-pay | Admitting: Orthopedic Surgery

## 2021-11-27 ENCOUNTER — Other Ambulatory Visit: Payer: Self-pay

## 2021-11-29 ENCOUNTER — Encounter: Payer: Self-pay | Admitting: Family Medicine

## 2021-12-01 ENCOUNTER — Encounter (HOSPITAL_BASED_OUTPATIENT_CLINIC_OR_DEPARTMENT_OTHER)
Admission: RE | Admit: 2021-12-01 | Discharge: 2021-12-01 | Disposition: A | Discharge status: Home / Self Care | Payer: 59 | Source: Ambulatory Visit | Attending: Orthopedic Surgery | Admitting: Orthopedic Surgery

## 2021-12-01 DIAGNOSIS — Z01818 Encounter for other preprocedural examination: Secondary | ICD-10-CM | POA: Insufficient documentation

## 2021-12-01 DIAGNOSIS — I10 Essential (primary) hypertension: Secondary | ICD-10-CM | POA: Insufficient documentation

## 2021-12-01 DIAGNOSIS — E119 Type 2 diabetes mellitus without complications: Secondary | ICD-10-CM | POA: Diagnosis not present

## 2021-12-01 LAB — BASIC METABOLIC PANEL
Anion gap: 5 (ref 5–15)
BUN: 18 mg/dL (ref 8–23)
CO2: 26 mmol/L (ref 22–32)
Calcium: 9 mg/dL (ref 8.9–10.3)
Chloride: 110 mmol/L (ref 98–111)
Creatinine, Ser: 1.04 mg/dL — ABNORMAL HIGH (ref 0.44–1.00)
GFR, Estimated: >60 mL/min (ref >60)
Glucose, Bld: 97 mg/dL (ref 70–99)
Potassium: 3.9 mmol/L (ref 3.5–5.1)
Sodium: 141 mmol/L (ref 135–145)

## 2021-12-04 ENCOUNTER — Ambulatory Visit (HOSPITAL_BASED_OUTPATIENT_CLINIC_OR_DEPARTMENT_OTHER): Payer: 59 | Admitting: Anesthesiology

## 2021-12-04 ENCOUNTER — Encounter (HOSPITAL_BASED_OUTPATIENT_CLINIC_OR_DEPARTMENT_OTHER): Payer: Self-pay | Admitting: Orthopedic Surgery

## 2021-12-04 ENCOUNTER — Other Ambulatory Visit: Payer: Self-pay

## 2021-12-04 ENCOUNTER — Other Ambulatory Visit (HOSPITAL_COMMUNITY): Payer: Self-pay

## 2021-12-04 ENCOUNTER — Ambulatory Visit (HOSPITAL_BASED_OUTPATIENT_CLINIC_OR_DEPARTMENT_OTHER): Payer: 59

## 2021-12-04 ENCOUNTER — Encounter (HOSPITAL_BASED_OUTPATIENT_CLINIC_OR_DEPARTMENT_OTHER)
Admission: RE | Disposition: A | Discharge status: Home / Self Care | Payer: Self-pay | Source: Home / Self Care | Attending: Orthopedic Surgery

## 2021-12-04 ENCOUNTER — Ambulatory Visit (HOSPITAL_BASED_OUTPATIENT_CLINIC_OR_DEPARTMENT_OTHER)
Admission: RE | Admit: 2021-12-04 | Discharge: 2021-12-04 | Disposition: A | Discharge status: Home / Self Care | Payer: 59 | Attending: Orthopedic Surgery | Admitting: Orthopedic Surgery

## 2021-12-04 DIAGNOSIS — Z87891 Personal history of nicotine dependence: Secondary | ICD-10-CM

## 2021-12-04 DIAGNOSIS — J45909 Unspecified asthma, uncomplicated: Secondary | ICD-10-CM | POA: Insufficient documentation

## 2021-12-04 DIAGNOSIS — E119 Type 2 diabetes mellitus without complications: Secondary | ICD-10-CM

## 2021-12-04 DIAGNOSIS — Z01818 Encounter for other preprocedural examination: Secondary | ICD-10-CM

## 2021-12-04 DIAGNOSIS — S66113A Strain of flexor muscle, fascia and tendon of left middle finger at wrist and hand level, initial encounter: Secondary | ICD-10-CM | POA: Diagnosis not present

## 2021-12-04 DIAGNOSIS — G4730 Sleep apnea, unspecified: Secondary | ICD-10-CM | POA: Insufficient documentation

## 2021-12-04 DIAGNOSIS — S66812A Strain of other specified muscles, fascia and tendons at wrist and hand level, left hand, initial encounter: Secondary | ICD-10-CM

## 2021-12-04 DIAGNOSIS — S66123A Laceration of flexor muscle, fascia and tendon of left middle finger at wrist and hand level, initial encounter: Secondary | ICD-10-CM | POA: Diagnosis not present

## 2021-12-04 DIAGNOSIS — I10 Essential (primary) hypertension: Secondary | ICD-10-CM | POA: Insufficient documentation

## 2021-12-04 DIAGNOSIS — S66193A Other injury of flexor muscle, fascia and tendon of left middle finger at wrist and hand level, initial encounter: Secondary | ICD-10-CM | POA: Diagnosis not present

## 2021-12-04 DIAGNOSIS — G8918 Other acute postprocedural pain: Secondary | ICD-10-CM | POA: Diagnosis not present

## 2021-12-04 DIAGNOSIS — X500XXA Overexertion from strenuous movement or load, initial encounter: Secondary | ICD-10-CM | POA: Diagnosis not present

## 2021-12-04 DIAGNOSIS — Z79899 Other long term (current) drug therapy: Secondary | ICD-10-CM | POA: Diagnosis not present

## 2021-12-04 HISTORY — PX: FLEXOR TENDON REPAIR: SHX6501

## 2021-12-04 LAB — GLUCOSE, CAPILLARY
Glucose-Capillary: 79 mg/dL (ref 70–99)
Glucose-Capillary: 90 mg/dL (ref 70–99)

## 2021-12-04 SURGERY — REPAIR, TENDON, FLEXOR
Anesthesia: Regional | Site: Hand | Laterality: Left

## 2021-12-04 MED ORDER — ACETAMINOPHEN 10 MG/ML IV SOLN: 1000.0000 mg | Freq: Once | INTRAVENOUS | Status: DC | PRN

## 2021-12-04 MED ORDER — PROPOFOL 10 MG/ML IV BOLUS
INTRAVENOUS | Status: DC | PRN
  Administered 2021-12-04: 200 mg via INTRAVENOUS

## 2021-12-04 MED ORDER — EPHEDRINE SULFATE (PRESSORS) 50 MG/ML IJ SOLN
INTRAMUSCULAR | Status: DC | PRN
  Administered 2021-12-04: 10 mg via INTRAVENOUS
  Administered 2021-12-04 (×3): 5 mg via INTRAVENOUS

## 2021-12-04 MED ORDER — FENTANYL CITRATE (PF) 100 MCG/2ML IJ SOLN: 25.0000 ug | INTRAMUSCULAR | Status: DC | PRN

## 2021-12-04 MED ORDER — MIDAZOLAM HCL 2 MG/2ML IJ SOLN
INTRAMUSCULAR | Status: AC
  Filled 2021-12-04: qty 2

## 2021-12-04 MED ORDER — OXYCODONE HCL 5 MG PO TABS
5.0000 mg | ORAL_TABLET | Freq: Four times a day (QID) | ORAL | 0 refills | Status: AC | PRN
  Filled 2021-12-04: qty 28, 7d supply, fill #0

## 2021-12-04 MED ORDER — FENTANYL CITRATE (PF) 100 MCG/2ML IJ SOLN
100.0000 ug | Freq: Once | INTRAMUSCULAR | Status: AC
  Administered 2021-12-04: 50 ug via INTRAVENOUS

## 2021-12-04 MED ORDER — 0.9 % SODIUM CHLORIDE (POUR BTL) OPTIME
TOPICAL | Status: DC | PRN
  Administered 2021-12-04: 100 mL

## 2021-12-04 MED ORDER — LIDOCAINE HCL (PF) 1 % IJ SOLN
INTRAMUSCULAR | Status: AC
  Filled 2021-12-04: qty 30

## 2021-12-04 MED ORDER — ROPIVACAINE HCL 5 MG/ML IJ SOLN
INTRAMUSCULAR | Status: DC | PRN
  Administered 2021-12-04: 30 mL via PERINEURAL

## 2021-12-04 MED ORDER — FENTANYL CITRATE (PF) 100 MCG/2ML IJ SOLN
INTRAMUSCULAR | Status: AC
  Filled 2021-12-04: qty 2

## 2021-12-04 MED ORDER — ONDANSETRON HCL 4 MG/2ML IJ SOLN
INTRAMUSCULAR | Status: DC | PRN
  Administered 2021-12-04: 4 mg via INTRAVENOUS

## 2021-12-04 MED ORDER — CEFAZOLIN SODIUM-DEXTROSE 2-4 GM/100ML-% IV SOLN
2.0000 g | INTRAVENOUS | Status: AC
  Administered 2021-12-04: 2 g via INTRAVENOUS

## 2021-12-04 MED ORDER — LIDOCAINE HCL (CARDIAC) PF 100 MG/5ML IV SOSY
PREFILLED_SYRINGE | INTRAVENOUS | Status: DC | PRN
  Administered 2021-12-04: 60 mg via INTRATRACHEAL

## 2021-12-04 MED ORDER — DEXAMETHASONE SODIUM PHOSPHATE 10 MG/ML IJ SOLN
INTRAMUSCULAR | Status: DC | PRN
  Administered 2021-12-04: 10 mg

## 2021-12-04 MED ORDER — MIDAZOLAM HCL 2 MG/2ML IJ SOLN
2.0000 mg | Freq: Once | INTRAMUSCULAR | Status: AC
  Administered 2021-12-04: 1 mg via INTRAVENOUS

## 2021-12-04 MED ORDER — DEXAMETHASONE SODIUM PHOSPHATE 10 MG/ML IJ SOLN
INTRAMUSCULAR | Status: DC | PRN
  Administered 2021-12-04 (×2): 5 mg via INTRAVENOUS

## 2021-12-04 MED ORDER — LACTATED RINGERS IV SOLN: INTRAVENOUS | Status: DC

## 2021-12-04 MED ORDER — CEFAZOLIN SODIUM-DEXTROSE 2-4 GM/100ML-% IV SOLN
INTRAVENOUS | Status: AC
  Filled 2021-12-04: qty 100

## 2021-12-04 SURGICAL SUPPLY — 65 items
APL PRP STRL LF DISP 70% ISPRP (MISCELLANEOUS) ×1
BLADE MINI RND TIP GREEN BEAV (BLADE) IMPLANT
BLADE SURG 15 STRL LF DISP TIS (BLADE) ×4 IMPLANT
BLADE SURG 15 STRL SS (BLADE) ×2
BNDG CMPR 9X4 STRL LF SNTH (GAUZE/BANDAGES/DRESSINGS) ×1
BNDG ELASTIC 2X5.8 VLCR STR LF (GAUZE/BANDAGES/DRESSINGS) IMPLANT
BNDG ELASTIC 3X5.8 VLCR STR LF (GAUZE/BANDAGES/DRESSINGS) ×2 IMPLANT
BNDG ESMARK 4X9 LF (GAUZE/BANDAGES/DRESSINGS) ×2 IMPLANT
BNDG GAUZE DERMACEA FLUFF 4 (GAUZE/BANDAGES/DRESSINGS) ×2 IMPLANT
BNDG GZE DERMACEA 4 6PLY (GAUZE/BANDAGES/DRESSINGS) ×1
CATH ROBINSON RED A/P 10FR (CATHETERS) IMPLANT
CHLORAPREP W/TINT 26 (MISCELLANEOUS) ×2 IMPLANT
CORD BIPOLAR FORCEPS 12FT (ELECTRODE) ×2 IMPLANT
COVER BACK TABLE 60X90IN (DRAPES) ×2 IMPLANT
COVER MAYO STAND STRL (DRAPES) ×2 IMPLANT
CUFF TOURN SGL QUICK 18X4 (TOURNIQUET CUFF) ×2 IMPLANT
CUFF TOURN SGL QUICK 24 (TOURNIQUET CUFF) ×1
CUFF TRNQT CYL 24X4X16.5-23 (TOURNIQUET CUFF) IMPLANT
DRAPE EXTREMITY T 121X128X90 (DISPOSABLE) ×2 IMPLANT
DRAPE OEC MINIVIEW 54X84 (DRAPES) IMPLANT
DRAPE SURG 17X23 STRL (DRAPES) ×2 IMPLANT
GAUZE 4X4 16PLY ~~LOC~~+RFID DBL (SPONGE) IMPLANT
GAUZE PAD ABD 8X10 STRL (GAUZE/BANDAGES/DRESSINGS) IMPLANT
GAUZE XEROFORM 1X8 LF (GAUZE/BANDAGES/DRESSINGS) ×2 IMPLANT
GLOVE BIO SURGEON STRL SZ7 (GLOVE) ×2 IMPLANT
GLOVE BIOGEL PI IND STRL 7.0 (GLOVE) ×2 IMPLANT
GLOVE BIOGEL PI INDICATOR 7.0 (GLOVE) ×5
GLOVE ECLIPSE 6.5 STRL STRAW (GLOVE) IMPLANT
GOWN STRL REUS W/ TWL LRG LVL3 (GOWN DISPOSABLE) ×4 IMPLANT
GOWN STRL REUS W/TWL LRG LVL3 (GOWN DISPOSABLE) ×4
LOOP VESSEL MAXI BLUE (MISCELLANEOUS) IMPLANT
NDL HYPO 25X1 1.5 SAFETY (NEEDLE) IMPLANT
NDL KEITH (NEEDLE) IMPLANT
NEEDLE HYPO 25X1 1.5 SAFETY (NEEDLE) IMPLANT
NEEDLE KEITH (NEEDLE) IMPLANT
NS IRRIG 1000ML POUR BTL (IV SOLUTION) ×2 IMPLANT
PACK BASIN DAY SURGERY FS (CUSTOM PROCEDURE TRAY) ×2 IMPLANT
PAD CAST 3X4 CTTN HI CHSV (CAST SUPPLIES) ×2 IMPLANT
PAD CAST 4YDX4 CTTN HI CHSV (CAST SUPPLIES) IMPLANT
PADDING CAST ABS COTTON 3X4 (CAST SUPPLIES) IMPLANT
PADDING CAST ABS COTTON 4X4 ST (CAST SUPPLIES) ×2 IMPLANT
PADDING CAST COTTON 3X4 STRL (CAST SUPPLIES) ×1
PADDING CAST COTTON 4X4 STRL (CAST SUPPLIES)
SLEEVE SCD COMPRESS KNEE MED (STOCKING) IMPLANT
SLING ARM FOAM STRAP LRG (SOFTGOODS) IMPLANT
SPIKE FLUID TRANSFER (MISCELLANEOUS) IMPLANT
SPLINT FIBERGLASS 4X30 (CAST SUPPLIES) ×2 IMPLANT
SPLINT PLASTER CAST XFAST 3X15 (CAST SUPPLIES) IMPLANT
SPLINT PLASTER XTRA FASTSET 3X (CAST SUPPLIES)
SUT ETHIBOND 3-0 V-5 (SUTURE) IMPLANT
SUT ETHILON 3 0 PS 1 (SUTURE) IMPLANT
SUT ETHILON 4 0 PS 2 18 (SUTURE) ×2 IMPLANT
SUT FIBERWIRE 3-0 18 TAPR NDL (SUTURE)
SUT FIBERWIRE 4-0 18 DIAM BLUE (SUTURE)
SUT MNCRL AB 4-0 PS2 18 (SUTURE) IMPLANT
SUT MON AB 5-0 PS2 18 (SUTURE) IMPLANT
SUT PROLENE 6 0 P 1 18 (SUTURE) IMPLANT
SUT SILK 2 0 PERMA HAND 18 BK (SUTURE) IMPLANT
SUT SUPRAMID 4-0 (SUTURE) IMPLANT
SUTURE FIBERWR 3-0 18 TAPR NDL (SUTURE) IMPLANT
SUTURE FIBERWR 4-0 18 DIA BLUE (SUTURE) IMPLANT
SYR BULB EAR ULCER 3OZ GRN STR (SYRINGE) ×2 IMPLANT
SYR CONTROL 10ML LL (SYRINGE) IMPLANT
TOWEL GREEN STERILE FF (TOWEL DISPOSABLE) ×4 IMPLANT
UNDERPAD 30X36 HEAVY ABSORB (UNDERPADS AND DIAPERS) ×2 IMPLANT

## 2021-12-04 NOTE — Progress Notes (Signed)
Assisted Dr. Greg Stoltzfus with left, supraclavicular, ultrasound guided block. Side rails up, monitors on throughout procedure. See vital signs in flow sheet. Tolerated Procedure well. 

## 2021-12-04 NOTE — Anesthesia Procedure Notes (Signed)
Anesthesia Regional Block: Supraclavicular block   Pre-Anesthetic Checklist: , timeout performed,  Correct Patient, Correct Site, Correct Laterality,  Correct Procedure, Correct Position, site marked,  Risks and benefits discussed,  Surgical consent,  Pre-op evaluation,  At surgeon's request and post-op pain management  Laterality: Left  Prep: Dura Prep       Needles:  Injection technique: Single-shot  Needle Type: Echogenic Stimulator Needle     Needle Length: 5cm  Needle Gauge: 20     Additional Needles:   Procedures:,,,, ultrasound used (permanent image in chart),,    Narrative:  Start time: 12/04/2021 11:49 AM End time: 12/04/2021 11:52 AM Injection made incrementally with aspirations every 5 mL.  Performed by: Personally  Anesthesiologist: Atilano Median, DO  Additional Notes: Patient identified. Risks/Benefits/Options discussed with patient including but not limited to bleeding, infection, nerve damage, failed block, incomplete pain control. Patient expressed understanding and wished to proceed. All questions were answered. Sterile technique was used throughout the entire procedure. Please see nursing notes for vital signs. Aspirated in 5cc intervals with injection for negative confirmation. Patient was given instructions on fall risk and not to get out of bed. All questions and concerns addressed with instructions to call with any issues or inadequate analgesia.

## 2021-12-04 NOTE — Transfer of Care (Signed)
Immediate Anesthesia Transfer of Care Note  Patient: Ashlee Williams  Procedure(s) Performed: LEFT MIDDLE FINGER FLEXOR DIGITORUM PROFUNDUS TENDON REPAIR (Left: Hand)  Patient Location: PACU  Anesthesia Type:General  Level of Consciousness: drowsy, patient cooperative and responds to stimulation  Airway & Oxygen Therapy: Patient Spontanous Breathing and Patient connected to face mask oxygen  Post-op Assessment: Report given to RN and Post -op Vital signs reviewed and stable  Post vital signs: Reviewed and stable  Last Vitals:  Vitals Value Taken Time  BP    Temp    Pulse 72 12/04/21 1340  Resp    SpO2 94 % 12/04/21 1340  Vitals shown include unvalidated device data.  Last Pain:  Vitals:   12/04/21 0959  TempSrc: Oral  PainSc: 0-No pain      Patients Stated Pain Goal: 5 (12/04/21 0959)  Complications: No notable events documented.

## 2021-12-04 NOTE — Op Note (Signed)
Date of Surgery: 12/04/2021  INDICATIONS: Patient is a 66 y.o.-year-old female with a left middle finger flexor tendon injury.  Patient was pulling on her shoe in the last several weeks and felt a pop in her left middle finger.  She was subsequently unable to flex at the DIP joint but had intact flexion at the PIP joint.  She was seen in my office where x-rays were obtained without any bony injury.  I was concerned that this was an FDP avulsion injury given the mechanism and patient's description..  Risks, benefits, and alternatives to surgery were again discussed with the patient in the preoperative area. The patient wishes to proceed with surgery.  Informed consent was signed after our discussion.   PREOPERATIVE DIAGNOSIS:   Left middle finger FDP tendon avulsion in zone 1  POSTOPERATIVE DIAGNOSIS:  Left middle finger FDP avulsion, zone 3/4  PROCEDURE:  Exploration left middle finger   SURGEON: Audria Nine, M.D.  ASSIST:   ANESTHESIA:  Regional, MAC  IV FLUIDS AND URINE: See anesthesia.  ESTIMATED BLOOD LOSS: 5 mL.  IMPLANTS: * No implants in log *   DRAINS: None  COMPLICATIONS: None  DESCRIPTION OF PROCEDURE: The patient was met in the preoperative holding area where the surgical site was marked and the consent form was verified.  The patient was then taken to the operating room and transferred to the operating table.  All bony prominences were well padded.  A tourniquet was applied to the left upper arm.  General endotracheal anesthesia was induced.  The operative extremity was prepped and draped in the usual and sterile fashion.  A formal time-out was performed to confirm that this was the correct patient, surgery, side, and site.   Following formal timeout, the limb was gently exsanguinated with an Esmarch bandage and the tourniquet inflated to 50 mmHg.  I began by making a Alyse Low style incision over the DIP joint.  The skin was incised.  The radial neurovascular bundle  was identified.  Full-thickness skin flap was elevated.  The FDP tendon was identified and was found to be intact at its distal insertion.  I then extended the incision proximally to the level of the A2 pulley [place] style incision.  The radial and ulnar neurovascular bundles were identified and protected..  Once again, the tendon was found to be intact.  Incision was made over the A1 pulley.  The A1 pulley was divided.  Traction was pulled on the profundus tendon within the A1 pulley and was found to be avulsed proximally in zone 3 or zone 4.  Given that a repair at this level is often difficult secondary to tendon attrition, I was concerned that this would be a large surgery that would require intercalary graft or side-to-side repair.  I therefore decided to scrub out and discuss with the patient's daughter.  After our discussion, her daughter and I agreed that that would be more surgery than we previously discussed and was not part of our informed consent discussion.  We decided to forego further exploration and tendon repair or reconstruction so that we can discuss with the patient.  After I scrubbed back in, the wounds were thoroughly irrigated with copious sterile saline.  They were closed using a 4-0 nylon suture in a horizontal and simple fashion.  The tourniquet was let down hemostasis was achieved with direct pressure over the wound.  The finger was warm, pink, and well-perfused at the end of the procedure.  The wound was then  dressed with Xeroform, folded Kerlix, cast padding, and an Ace wrap.  Patient was then reversed from anesthesia and extubated uneventfully.  She was taken to the postoperative care unit in stable condition.  All counts were correct at the end of the procedure.   POSTOPERATIVE PLAN: Patient be discharged home with appropriate pain medication and discharge instructions.  I will see her back in my office which time we can discuss the next steps in treatment and potential surgical  reconstruction versus conservative management with therapy and early motion.  Audria Nine, MD 1:57 PM

## 2021-12-04 NOTE — Anesthesia Procedure Notes (Signed)
Procedure Name: LMA Insertion Date/Time: 12/04/2021 12:21 PM  Performed by: Thornell Mule, CRNAPre-anesthesia Checklist: Patient identified, Emergency Drugs available, Suction available and Patient being monitored Patient Re-evaluated:Patient Re-evaluated prior to induction Oxygen Delivery Method: Circle system utilized Preoxygenation: Pre-oxygenation with 100% oxygen Induction Type: IV induction LMA: LMA inserted LMA Size: 4.0 Number of attempts: 1 Placement Confirmation: positive ETCO2 Tube secured with: Tape Dental Injury: Teeth and Oropharynx as per pre-operative assessment

## 2021-12-04 NOTE — Anesthesia Postprocedure Evaluation (Signed)
Anesthesia Post Note  Patient: Ashlee Williams  Procedure(s) Performed: LEFT MIDDLE FINGER FLEXOR DIGITORUM PROFUNDUS TENDON REPAIR (Left: Hand)     Patient location during evaluation: PACU Anesthesia Type: Regional and General Level of consciousness: awake and alert Pain management: pain level controlled Vital Signs Assessment: post-procedure vital signs reviewed and stable Respiratory status: spontaneous breathing, nonlabored ventilation, respiratory function stable and patient connected to nasal cannula oxygen Cardiovascular status: blood pressure returned to baseline and stable Postop Assessment: no apparent nausea or vomiting Anesthetic complications: no   No notable events documented.  Last Vitals:  Vitals:   12/04/21 1400 12/04/21 1415  BP: (!) 140/93 (!) 135/92  Pulse: 69 69  Resp: 19 18  Temp:    SpO2: 97% 97%    Last Pain:  Vitals:   12/04/21 1340  TempSrc:   PainSc: Asleep                 Earl Lites P Amore Grater

## 2021-12-04 NOTE — Anesthesia Preprocedure Evaluation (Addendum)
Anesthesia Evaluation  Patient identified by MRN, date of birth, ID band Patient awake    Reviewed: Allergy & Precautions, NPO status , Patient's Chart, lab work & pertinent test results  Airway Mallampati: III  TM Distance: >3 FB Neck ROM: Full    Dental no notable dental hx.    Pulmonary asthma , sleep apnea , former smoker,    Pulmonary exam normal        Cardiovascular hypertension, Pt. on medications  Rhythm:Regular Rate:Normal     Neuro/Psych  Headaches, negative psych ROS   GI/Hepatic negative GI ROS, Neg liver ROS,   Endo/Other  diabetes  Renal/GU negative Renal ROS  negative genitourinary   Musculoskeletal Finger tendon rupture   Abdominal Normal abdominal exam  (+)   Peds  Hematology  (+) Blood dyscrasia, anemia ,   Anesthesia Other Findings   Reproductive/Obstetrics                             Anesthesia Physical Anesthesia Plan  ASA: 3  Anesthesia Plan: Regional and General   Post-op Pain Management: Regional block*   Induction: Intravenous  PONV Risk Score and Plan: 2 and Ondansetron, Dexamethasone, Treatment may vary due to age or medical condition and Midazolam  Airway Management Planned: Mask and LMA  Additional Equipment: None  Intra-op Plan:   Post-operative Plan: Extubation in OR  Informed Consent: I have reviewed the patients History and Physical, chart, labs and discussed the procedure including the risks, benefits and alternatives for the proposed anesthesia with the patient or authorized representative who has indicated his/her understanding and acceptance.     Dental advisory given  Plan Discussed with:   Anesthesia Plan Comments:        Anesthesia Quick Evaluation

## 2021-12-04 NOTE — Discharge Instructions (Addendum)
  Ashlee Williams, M.D. Hand Surgery  POST-OPERATIVE DISCHARGE INSTRUCTIONS   PRESCRIPTIONS: You may have been given a prescription to be taken as directed for post-operative pain control.  You may also take over the counter ibuprofen/aleve and tylenol for pain. Take this as directed on the packaging. Do not exceed 3000 mg tylenol/acetaminophen in 24 hours.  Ibuprofen 600-800 mg (3-4) tablets by mouth every 6 hours as needed for pain.  OR Aleve 2 tablets by mouth every 12 hours (twice daily) as needed for pain.  AND/OR Tylenol 1000 mg (2 tablets) every 8 hours as needed for pain.  Please use your pain medication carefully, as refills are limited and you may not be provided with one.  As stated above, please use over the counter pain medicine - it will also be helpful with decreasing your swelling.    ANESTHESIA: After your surgery, post-surgical discomfort or pain is likely. This discomfort can last several days to a few weeks. At certain times of the day your discomfort may be more intense.   Did you receive a nerve block?  A nerve block can provide pain relief for one hour to two days after your surgery. As long as the nerve block is working, you will experience little or no sensation in the area the surgeon operated on.  As the nerve block wears off, you will begin to experience pain or discomfort. It is very important that you begin taking your prescribed pain medication before the nerve block fully wears off. Treating your pain at the first sign of the block wearing off will ensure your pain is better controlled and more tolerable when full-sensation returns. Do not wait until the pain is intolerable, as the medicine will be less effective. It is better to treat pain in advance than to try and catch up.   General Anesthesia:  If you did not receive a nerve block during your surgery, you will need to start taking your pain medication shortly after your surgery and should continue  to do so as prescribed by your surgeon.     ICE AND ELEVATION: You may use ice for the first 48-72 hours, but it is not critical.   Motion of your fingers is very important to decrease the swelling.  Elevation, as much as possible for the next 48 hours, is critical for decreasing swelling as well as for pain relief. Elevation means when you are seated or lying down, you hand should be at or above your heart. When walking, the hand needs to be at or above the level of your elbow.  If the bandage gets too tight, it may need to be loosened. Please contact our office and we will instruct you in how to do this.    SURGICAL BANDAGES:  Keep your dressing and/or splint clean and dry at all times.  Do not remove until you are seen again in the office.  If careful, you may place a plastic bag over your bandage and tape the end to shower, but be careful, do not get your bandages wet.     HAND THERAPY:  You may not need any. If you do, we will begin this at your follow up visit in the clinic.    ACTIVITY AND WORK: You are encouraged to move any fingers which are not in the bandage.  Light use of the fingers is allowed to assist the other hand with daily hygiene and eating, but strong gripping or lifting is often uncomfortable and   should be avoided.  You might miss a variable period of time from work and hopefully this issue has been discussed prior to surgery. You may not do any heavy work with your affected hand for about 2 weeks.    [facility] [address] ,  [place]  [address] [phone]    Post Anesthesia Home Care Instructions  Activity: Get plenty of rest for the remainder of the day. A responsible individual must stay with you for 24 hours following the procedure.  For the next 24 hours, DO NOT: -Drive a car -Operate machinery -Drink alcoholic beverages -Take any medication unless instructed by your physician -Make any legal decisions or sign  important papers.  Meals: Start with liquid foods such as gelatin or soup. Progress to regular foods as tolerated. Avoid greasy, spicy, heavy foods. If nausea and/or vomiting occur, drink only clear liquids until the nausea and/or vomiting subsides. Call your physician if vomiting continues.  Special Instructions/Symptoms: Your throat may feel dry or sore from the anesthesia or the breathing tube placed in your throat during surgery. If this causes discomfort, gargle with warm salt water. The discomfort should disappear within 24 hours.  If you had a scopolamine patch placed behind your ear for the management of post- operative nausea and/or vomiting:  1. The medication in the patch is effective for 72 hours, after which it should be removed.  Wrap patch in a tissue and discard in the trash. Wash hands thoroughly with soap and water. 2. You may remove the patch earlier than 72 hours if you experience unpleasant side effects which may include dry mouth, dizziness or visual disturbances. 3. Avoid touching the patch. Wash your hands with soap and water after contact with the patch.    Regional Anesthesia Blocks  1. Numbness or the inability to move the "blocked" extremity may last from 3-48 hours after placement. The length of time depends on the medication injected and your individual response to the medication. If the numbness is not going away after 48 hours, call your surgeon.  2. The extremity that is blocked will need to be protected until the numbness is gone and the  Strength has returned. Because you cannot feel it, you will need to take extra care to avoid injury. Because it may be weak, you may have difficulty moving it or using it. You may not know what position it is in without looking at it while the block is in effect.  3. For blocks in the legs and feet, returning to weight bearing and walking needs to be done carefully. You will need to wait until the numbness is entirely gone and  the strength has returned. You should be able to move your leg and foot normally before you try and bear weight or walk. You will need someone to be with you when you first try to ensure you do not fall and possibly risk injury.  4. Bruising and tenderness at the needle site are common side effects and will resolve in a few days.  5. Persistent numbness or new problems with movement should be communicated to the surgeon or the [facility] ([phone])/ [facility] ([phone]).  

## 2021-12-04 NOTE — Interval H&P Note (Signed)
History and Physical Interval Note:  12/04/2021 12:02 PM  Ashlee Williams  has presented today for surgery, with the diagnosis of left middle finger tendon rupture of distal interphalangeal joint.  The various methods of treatment have been discussed with the patient and family. After consideration of risks, benefits and other options for treatment, the patient has consented to  Procedure(s): LEFT MIDDLE FINGER FLEXOR DIGITORUM PROFUNDUS TENDON REPAIR (Left) as a surgical intervention.  The patient's history has been reviewed, patient examined, no change in status, stable for surgery.  I have reviewed the patient's chart and labs.  Questions were answered to the patient's satisfaction.     Donte Lenzo Meliss Fleek

## 2021-12-05 ENCOUNTER — Encounter (HOSPITAL_BASED_OUTPATIENT_CLINIC_OR_DEPARTMENT_OTHER): Payer: Self-pay | Admitting: Orthopedic Surgery

## 2021-12-07 ENCOUNTER — Ambulatory Visit (INDEPENDENT_AMBULATORY_CARE_PROVIDER_SITE_OTHER): Payer: 59 | Admitting: Orthopedic Surgery

## 2021-12-07 ENCOUNTER — Ambulatory Visit: Payer: Self-pay

## 2021-12-07 DIAGNOSIS — M6630 Spontaneous rupture of flexor tendons, unspecified site: Secondary | ICD-10-CM

## 2021-12-07 DIAGNOSIS — S63639A Sprain of interphalangeal joint of unspecified finger, initial encounter: Secondary | ICD-10-CM

## 2021-12-07 NOTE — Progress Notes (Signed)
Post-Op Visit Note   Patient: Ashlee Williams           Date of Birth: 1955/06/10           MRN: [id number] Visit Date: 12/07/2021 PCP: Sheliah Hatch, MD   Assessment & Plan:  Chief Complaint:  Chief Complaint  Patient presents with   Left Middle Finger - Routine Post Op   Visit Diagnoses:  1. Traumatic rupture of tendon of distal interphalangeal (DIP) joint of finger, initial encounter     Plan: Patient presents for follow up of a left middle finger exploration.  She has what appears to be attritional ruputure of the middle finger profundus tendon after pulling on her shoe two weeks ago.  I initially thought this was an FDP avulsion given her presentation but it seems the tendon has ruptured in zone 3.  We discussed living with no profundus finger to the middle finger given her intact sublimis versus likely tendon reconstruction with palmaris autograft.  We discussed the risks of surgery including bleeding, infection, damage to neurovascular structures, prolonged swelling, stiffness, inability of surgery to fully restore finger function, and need for additional surgery.  Patient would like to consider these options.  In the meantime, I will refer her to hand therapy to start working on edema control and maximizing the function of her intact sublimis tendon.  The incision is clean, dry, and well approximated with nylon sutures.  She has intact sensation in radial and ulnar aspects of finger tip.  I can see her back next week for suture removal.   Follow-Up Instructions: No follow-ups on file.   Orders:  Orders Placed This Encounter  Procedures   XR Hand Complete Left   No orders of the defined types were placed in this encounter.   Imaging: No results found.  PMFS History: Patient Active Problem List   Diagnosis Date Noted   Rupture of flexor tendon of left hand    [place] finger 11/23/2021   Chronic migraine w/o aura w/o status migrainosus, not intractable  11/29/2020   Pulmonary nodule 01/01/2017   Obstructive sleep apnea 06/11/2016   Mixed rhinitis 06/11/2016   Hyperlipidemia associated with type 2 diabetes mellitus (HCC) 01/19/2015   Gallstones 11/18/2014   AAA (abdominal aortic aneurysm) (HCC)    Vitamin D deficiency 06/07/2014   Physical exam 02/14/2011   Hand pain 02/05/2011   Headache 05/09/2007   Diet-controlled diabetes mellitus (HCC) 04/04/2007   MORBID OBESITY 04/04/2007   Tobacco user 04/04/2007   Essential hypertension 04/04/2007   Past Medical History:  Diagnosis Date   AAA (abdominal aortic aneurysm) (HCC)    Allergy    Anemia    Asthma    Blood in stool    Bronchitis    Chicken pox    Chronic headaches    Diabetes mellitus    PT. IS TRYING TO CONTROL WITH DIET BUT AS NOTED WEIGHT IS UP   Hypertension    Obesity    Syncope and collapse 12/22/2016   Tobacco abuse     Family History  Problem Relation Age of Onset   Hypertension Mother    Stroke Mother    Hypertension Father    Diabetes Sister    Hypertension Sister    Cancer Sister    Diabetes Sister    Hypertension Brother    Cancer Maternal Aunt    Hypertension Maternal Aunt    Hypertension Maternal Uncle    Hypertension Paternal Aunt    Hypertension  Paternal Uncle    Stroke Maternal Grandmother    Hypertension Maternal Grandmother    Colon cancer Maternal Grandmother 48   Cancer Maternal Grandfather    Hypertension Maternal Grandfather    Hypertension Paternal Grandmother    Hypertension Paternal Grandfather     Past Surgical History:  Procedure Laterality Date   ABDOMINAL HYSTERECTOMY  04/09/1996   BREAST BIOPSY Right 12/07/2019   axilla/ neg   BREAST BIOPSY Right 12/07/2019   neg   CHOLECYSTECTOMY N/A 03/16/2015   Procedure: LAPAROSCOPIC CHOLECYSTECTOMY;  Surgeon: Jimmye Norman, MD;  Location: [facility] OR;  Service: General;  Laterality: N/A;   FLEXOR TENDON REPAIR Left 12/04/2021   Procedure: LEFT MIDDLE FINGER FLEXOR DIGITORUM PROFUNDUS TENDON  REPAIR;  Surgeon: Marlyne Beards, MD;  Location: [facility];  Service: Orthopedics;  Laterality: Left;   Social History   Occupational History   Not on file  Tobacco Use   Smoking status: Former    Packs/day: 0.50    Years: 10.00    Total pack years: 5.00    Types: Cigarettes    Quit date: 07/27/2020    Years since quitting: 1.3   Smokeless tobacco: Never  Vaping Use   Vaping Use: Never used  Substance and Sexual Activity   Alcohol use: Yes    Comment: occ   Drug use: No   Sexual activity: Yes

## 2021-12-08 ENCOUNTER — Telehealth: Payer: Self-pay | Admitting: Orthopedic Surgery

## 2021-12-08 NOTE — Telephone Encounter (Signed)
[employer] forms received. To [employer]. 

## 2021-12-15 ENCOUNTER — Telehealth: Payer: Self-pay | Admitting: Orthopedic Surgery

## 2021-12-15 NOTE — Telephone Encounter (Signed)
[employer] forms received. To [employer]. ?

## 2021-12-18 DIAGNOSIS — M66342 Spontaneous rupture of flexor tendons, left hand: Secondary | ICD-10-CM | POA: Diagnosis not present

## 2021-12-20 ENCOUNTER — Other Ambulatory Visit: Payer: Self-pay | Admitting: Family Medicine

## 2021-12-21 ENCOUNTER — Other Ambulatory Visit: Payer: Self-pay

## 2021-12-21 ENCOUNTER — Encounter: Payer: Self-pay | Admitting: Rehabilitative and Restorative Service Providers"

## 2021-12-21 ENCOUNTER — Ambulatory Visit (INDEPENDENT_AMBULATORY_CARE_PROVIDER_SITE_OTHER): Payer: 59 | Admitting: Rehabilitative and Restorative Service Providers"

## 2021-12-21 ENCOUNTER — Other Ambulatory Visit (HOSPITAL_COMMUNITY): Payer: Self-pay

## 2021-12-21 DIAGNOSIS — R600 Localized edema: Secondary | ICD-10-CM | POA: Diagnosis not present

## 2021-12-21 DIAGNOSIS — R278 Other lack of coordination: Secondary | ICD-10-CM | POA: Diagnosis not present

## 2021-12-21 DIAGNOSIS — M6281 Muscle weakness (generalized): Secondary | ICD-10-CM

## 2021-12-21 DIAGNOSIS — M25642 Stiffness of left hand, not elsewhere classified: Secondary | ICD-10-CM | POA: Diagnosis not present

## 2021-12-21 DIAGNOSIS — M79642 Pain in left hand: Secondary | ICD-10-CM

## 2021-12-21 MED ORDER — AMLODIPINE BESYLATE 5 MG PO TABS
5.0000 mg | ORAL_TABLET | Freq: Every day | ORAL | 3 refills | Status: DC
  Filled 2021-12-21: qty 30, 30d supply, fill #0
  Filled 2022-01-22: qty 30, 30d supply, fill #1
  Filled 2022-03-06: qty 30, 30d supply, fill #2
  Filled 2022-05-16: qty 30, 30d supply, fill #3

## 2021-12-21 NOTE — Therapy (Signed)
OUTPATIENT OCCUPATIONAL THERAPY ORTHO EVALUATION  Patient Name: Ashlee Williams MRN: [id number] DOB:1955-04-13, 66 y.o., female Today's Date: 12/21/2021  PCP: Neena Rhymes, MD REFERRING PROVIDER: Marlyne Beards, MD   OT End of Session - 12/21/21 1309     Visit Number 1    Number of Visits 6    Date for OT Re-Evaluation 01/19/22    OT Start Time 1303    OT Stop Time 1353    OT Time Calculation (min) 50 min    Activity Tolerance Patient tolerated treatment well;No increased pain;Patient limited by pain;Patient limited by fatigue    Behavior During Therapy [facility] for tasks assessed/performed             Past Medical History:  Diagnosis Date   AAA (abdominal aortic aneurysm) (HCC)    Allergy    Anemia    Asthma    Blood in stool    Bronchitis    Chicken pox    Chronic headaches    Diabetes mellitus    PT. IS TRYING TO CONTROL WITH DIET BUT AS NOTED WEIGHT IS UP   Hypertension    Obesity    Syncope and collapse 12/22/2016   Tobacco abuse    Past Surgical History:  Procedure Laterality Date   ABDOMINAL HYSTERECTOMY  04/09/1996   BREAST BIOPSY Right 12/07/2019   axilla/ neg   BREAST BIOPSY Right 12/07/2019   neg   CHOLECYSTECTOMY N/A 03/16/2015   Procedure: LAPAROSCOPIC CHOLECYSTECTOMY;  Surgeon: Jimmye Norman, MD;  Location: [facility] OR;  Service: General;  Laterality: N/A;   FLEXOR TENDON REPAIR Left 12/04/2021   Procedure: LEFT MIDDLE FINGER FLEXOR DIGITORUM PROFUNDUS TENDON REPAIR;  Surgeon: Marlyne Beards, MD;  Location: [facility];  Service: Orthopedics;  Laterality: Left;   Patient Active Problem List   Diagnosis Date Noted   Rupture of flexor tendon of left hand    [place] finger 11/23/2021   Chronic migraine w/o aura w/o status migrainosus, not intractable 11/29/2020   Pulmonary nodule 01/01/2017   Obstructive sleep apnea 06/11/2016   Mixed rhinitis 06/11/2016   Hyperlipidemia associated with type 2 diabetes mellitus (HCC)  01/19/2015   Gallstones 11/18/2014   AAA (abdominal aortic aneurysm) (HCC)    Vitamin D deficiency 06/07/2014   Physical exam 02/14/2011   Hand pain 02/05/2011   Headache 05/09/2007   Diet-controlled diabetes mellitus (HCC) 04/04/2007   MORBID OBESITY 04/04/2007   Tobacco user 04/04/2007   Essential hypertension 04/04/2007    ONSET DATE: DOS 12/04/21  REFERRING DIAG: M66.30 (ICD-10-CM) - Tendon rupture, nontraumatic, flexor  THERAPY DIAG:  Stiffness of left hand, not elsewhere classified  Pain in left hand  Muscle weakness (generalized)  Localized edema  Other lack of coordination  Rationale for Evaluation and Treatment Rehabilitation  SUBJECTIVE:   SUBJECTIVE STATEMENT: 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 She was putting on her shoes, felt a "pop" and ruptured her Lt MF FDP.  Sx repair was attempted but during sx it was determined to be an unexpected attritional zone 3 rupture, and repair was not done. Once awake she was informed and chose to not undergo more invasive repair and sinply come to therapy to work on mobilization and strength.  She states having some moderate pain in past week and some difficulty with home tasks.    PERTINENT HISTORY: Per MD: "Left middle finger attritional rupture of FDP  tendon in palm.  Needs to work on edema control and maximizing FDS function."  PRECAUTIONS: 2+ weeks since initial sx now, but no repair so WBAT with caution for slightly open wound on fingertip still.   WEIGHT BEARING RESTRICTIONS No  PAIN:  Are you having pain? Not now at rest Rating: 0/10 at rest now, up to 5-6/10 at worst in past week using hand and stretching hand  FALLS: Has patient fallen in last 6 months? No  LIVING ENVIRONMENT: She has her mother staying with her to help her 80 y/o mother .  PLOF: Independent  PATIENT GOALS Get L hand moving for ADLs as best as  possible  OBJECTIVE:   HAND DOMINANCE: Right   ADLs: Overall ADLs: States decreased ability to grab, hold household objects, pain and inability to open containers, perform all FMS tasks, etc.    FUNCTIONAL OUTCOME MEASURES: Eval: Quck DASH 32% impairment today   UPPER EXTREMITY ROM    Eval: Lack of full fist in IF and MF, can oppose and no loss of thumb motion.   IF: 2.8cm Gap MF: 7.8cm Gap RF: no gap, equal to right hand SF: no gap = to right hand  Active ROM Left eval  Long MCP (0-90) 0-55   Long PIP (0-100) 0-35   Long DIP (0-70) 0   (Blank rows = not tested)   UPPER EXTREMITY MMT:    Eval: WNL proximal to wrist in Lt arm, we will work into resistance in next week when safe, and wounds completely closed.   HAND FUNCTION: Eval: NT due to slightly open wound a tip of MF still and somewhat painful with pressure.  Grip strength Right: TBD lbs, Left: TBD lbs   COORDINATION: Eval: decreased due to inability to move MF well now;  9 Hole Peg Test Left: TBD sec   SENSATION: Eval:  Light touch intact today, not overly sensitive to sx area    EDEMA:   Eval:  Mildly swollen in Lt MF today  OBSERVATIONS:   Eval: sx wound slightly open at tip of MF today, but otherwise looking very good. Sx site over volar MCP J completely healed.  She has no MF DIP motion (typical for loss of FDP).  IF is somewhat stiff as well, but not SF or RF today.    TODAY'S TREATMENT:  Eval: OT edu on keeping wound clean, covered until completely healed, applied bacitracin and bandaide.  OT also edu for WBAT with caution for healing wound; she was encouraged to use hand for as much functional activity as tolerated now.   She was given comprehensive HEP to begin 3-6 x day as tolerated, as below.  She repeats all exercises back to OT well, with no significant increase in pain.   She is also edu on scar mobilizations (for closed portions) and dorsal taping over MF to increase PROM.  She tolerates this for  ~5 mins in clinic and her finger PROM greatly improves.  No questions or concerns at the end.   Exercises - Flex, then pull back wrist and fingers, hand on table hand on paper  - 3-4 x daily - 1-2 sets - 10-15 reps - Wrist Prayer Stretch  - 3-4 x daily - 3-5 reps - 15 sec hold - Tendon Glides  - 3-4 x daily - 1 sets - 5- 10 reps - 2-3 seconds hold - HOOK Stretch  - 3-4 x daily - 2-3 reps - 15-20 sec hold - Seated Finger Composite Flexion  Stretch  - 3-4 x daily - 3-5 reps - 15 hold - Finger Extension or EDC Glides: Pen Roll From Fist to [employer]  - 4-6 x daily - 1 sets - 10-15 reps   PATIENT EDUCATION: Education details: See tx section above for details  Person educated: Patient Education method: Engineer, structural, Teach back, Handouts  Education comprehension: States and demonstrates understanding, Additional Education required    HOME EXERCISE PROGRAM: Access Code: [email or web address] URL: [email or web address] Date: 12/21/2021 Prepared by: Fannie Knee   GOALS: Goals reviewed with patient? Yes   SHORT TERM GOALS: (STG required if POC>30 days)  Pt will obtain protective, custom orthotic. Target date: TBD, as needed Goal status: INTIAL  2.  Pt will demo/state understanding of initial HEP to improve pain levels and prerequisite motion. Target date: 12/28/21 Goal status: INITIAL   LONG TERM GOALS:  Pt will improve functional ability by decreased impairment per Quick DASH assessment from 32% to 10% or better, for better quality of life. Target date: 01/19/22 Goal status: INITIAL  2.  Pt will improve grip strength in left hand to at least 45lbs for functional use at home and in IADLs. Target date: 01/19/22 Goal status: INITIAL  3.  Pt will improve A/ROM in Left MF TAM from 90* to at least 170*, to have functional motion for tasks like grasp and hold.  Target date: 01/19/22 Goal status: INITIAL  4.  Pt will decrease pain at worst from 5-6/10 to 1/10 or better to  have better sleep and occupational participation in daily roles. Target date: 01/19/22 Goal status: INITIAL   ASSESSMENT:  CLINICAL IMPRESSION: Patient is a 66 y.o. female who was seen today for occupational therapy evaluation for left hand FDP rupture and decreased ability now. She will benefit from OP OT.   PERFORMANCE DEFICITS in functional skills including ADLs, IADLs, coordination, dexterity, edema, ROM, strength, pain, flexibility, FMC, GMC, endurance, wound, and UE functional use, cognitive skills including problem solving, and psychosocial skills including coping strategies and environmental adaptation.   IMPAIRMENTS are limiting patient from ADLs, IADLs, work, and social participation.   COMORBIDITIES may have co-morbidities  that affects occupational performance. Patient will benefit from skilled OT to address above impairments and improve overall function.  MODIFICATION OR ASSISTANCE TO COMPLETE EVALUATION: No modification of tasks or assist necessary to complete an evaluation.  OT OCCUPATIONAL PROFILE AND HISTORY: Problem focused assessment: Including review of records relating to presenting problem.  CLINICAL DECISION MAKING: Moderate - several treatment options, min-mod task modification necessary  REHAB POTENTIAL: Excellent  EVALUATION COMPLEXITY: Low      PLAN: OT FREQUENCY: 1-2x/week  OT DURATION: 4 weeks (through 01/19/22)   PLANNED INTERVENTIONS: self care/ADL training, therapeutic exercise, therapeutic activity, neuromuscular re-education, manual therapy, scar mobilization, passive range of motion, splinting, ultrasound, fluidotherapy, compression bandaging, moist heat, cryotherapy, contrast bath, patient/family education, coping strategies training, and DME and/or AE instructions  RECOMMENDED OTHER SERVICES: none now   CONSULTED AND AGREED WITH PLAN OF CARE: Patient  PLAN FOR NEXT SESSION: Check wound, HEP and any fnl complaints.  Add grip and pinch  training as tolerated. Do manual therapy as indicated    Fannie Knee, OTR/L, CHT 12/21/2021, 2:23 PM

## 2021-12-28 ENCOUNTER — Encounter: Payer: Self-pay | Admitting: Rehabilitative and Restorative Service Providers"

## 2021-12-28 ENCOUNTER — Ambulatory Visit (INDEPENDENT_AMBULATORY_CARE_PROVIDER_SITE_OTHER): Payer: 59 | Admitting: Rehabilitative and Restorative Service Providers"

## 2021-12-28 DIAGNOSIS — M6281 Muscle weakness (generalized): Secondary | ICD-10-CM | POA: Diagnosis not present

## 2021-12-28 DIAGNOSIS — M25642 Stiffness of left hand, not elsewhere classified: Secondary | ICD-10-CM | POA: Diagnosis not present

## 2021-12-28 DIAGNOSIS — R600 Localized edema: Secondary | ICD-10-CM

## 2021-12-28 DIAGNOSIS — R278 Other lack of coordination: Secondary | ICD-10-CM

## 2021-12-28 DIAGNOSIS — M79642 Pain in left hand: Secondary | ICD-10-CM

## 2021-12-28 NOTE — Therapy (Signed)
OUTPATIENT OCCUPATIONAL THERAPY TREATMENT NOTE  Patient Name: Ashlee Williams MRN: [id number] DOB:Sep 20, 1955, 66 y.o., female Today's Date: 12/28/2021  PCP: Annye Asa, MD REFERRING PROVIDER: Sherilyn Cooter, MD   OT End of Session - 12/28/21 1605     Visit Number 2    Number of Visits 6    Date for OT Re-Evaluation 01/19/22    OT Start Time 1605    OT Stop Time 1653    OT Time Calculation (min) 48 min    Activity Tolerance Patient tolerated treatment well;No increased pain;Patient limited by pain;Patient limited by fatigue    Behavior During Therapy [facility] for tasks assessed/performed              Past Medical History:  Diagnosis Date   AAA (abdominal aortic aneurysm) (HCC)    Allergy    Anemia    Asthma    Blood in stool    Bronchitis    Chicken pox    Chronic headaches    Diabetes mellitus    PT. IS TRYING TO CONTROL WITH DIET BUT AS NOTED WEIGHT IS UP   Hypertension    Obesity    Syncope and collapse 12/22/2016   Tobacco abuse    Past Surgical History:  Procedure Laterality Date   ABDOMINAL HYSTERECTOMY  04/09/1996   BREAST BIOPSY Right 12/07/2019   axilla/ neg   BREAST BIOPSY Right 12/07/2019   neg   CHOLECYSTECTOMY N/A 03/16/2015   Procedure: LAPAROSCOPIC CHOLECYSTECTOMY;  Surgeon: Judeth Horn, MD;  Location: [place];  Service: General;  Laterality: N/A;   FLEXOR TENDON REPAIR Left 12/04/2021   Procedure: LEFT MIDDLE FINGER FLEXOR DIGITORUM PROFUNDUS TENDON REPAIR;  Surgeon: Sherilyn Cooter, MD;  Location: [place];  Service: Orthopedics;  Laterality: Left;   Patient Active Problem List   Diagnosis Date Noted   Rupture of flexor tendon of left hand    [place] finger 11/23/2021   Chronic migraine w/o aura w/o status migrainosus, not intractable 11/29/2020   Pulmonary nodule 01/01/2017   Obstructive sleep apnea 06/11/2016   Mixed rhinitis 06/11/2016   Hyperlipidemia associated with type 2 diabetes mellitus ([place])  01/19/2015   Gallstones 11/18/2014   AAA (abdominal aortic aneurysm) ([place])    Vitamin D deficiency 06/07/2014   Physical exam 02/14/2011   Hand pain 02/05/2011   Headache 05/09/2007   Diet-controlled diabetes mellitus ([place]) 04/04/2007   MORBID OBESITY 04/04/2007   Tobacco user 04/04/2007   Essential hypertension 04/04/2007    ONSET DATE: DOS 12/04/21  REFERRING DIAG: M66.30 (ICD-10-CM) - Tendon rupture, nontraumatic, flexor  THERAPY DIAG:  Stiffness of left hand, not elsewhere classified  Pain in left hand  Muscle weakness (generalized)  Localized edema  Other lack of coordination  Rationale for Evaluation and Treatment Rehabilitation  PERTINENT HISTORY: Per MD: "Left middle finger attritional rupture of FDP tendon in palm.  Needs to work on edema control and maximizing FDS function."  PRECAUTIONS: 2+ weeks since initial sx now, but no repair so WBAT with caution for slightly open wound on fingertip still.   WEIGHT BEARING RESTRICTIONS No   SUBJECTIVE:   SUBJECTIVE STATEMENT: She states started back work, typing, etc., and now she feels a bit more stiff and swollen.   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 She was putting on her shoes, felt a "pop" and ruptured her Lt MF FDP.  Sx repair was  attempted but during sx it was determined to be an unexpected attritional zone 3 rupture, and repair was not done. Once awake she was informed and chose to not undergo more invasive repair and simply come to therapy to work on mobilization and strength.  She states having some moderate pain in past week and some difficulty with home tasks.     PAIN:  Are you having pain?  Not now at rest Rating: 0/10 at rest now, up to 0/10 at worst in past week using hand and stretching hand   PATIENT GOALS Get L hand moving for ADLs as best as possible  OBJECTIVE: (All objective assessments below are from  initial evaluation on: 12/21/21 unless otherwise specified.)    HAND DOMINANCE: Right   ADLs: Overall ADLs: Eval: States decreased ability to grab, hold household objects, pain and inability to open containers, perform all FMS tasks, etc.    FUNCTIONAL OUTCOME MEASURES: Eval: Quck DASH 32% impairment today   UPPER EXTREMITY ROM    12/28/21: Motion slightly better today:   IF: 1.8cm Gap MF: 7.5cm Gap RF: no gap, equal to right hand SF: no gap = to right hand    Eval: Lack of full fist in IF and MF, can oppose and no loss of thumb motion.   IF: 2.8cm Gap MF: 7.8cm Gap RF: no gap, equal to right hand SF: no gap = to right hand  Active ROM Left eval Left 12/28/21  Long MCP (0-90) 0-55  0-60  Long PIP (0-100) 0-35  0-55  Long DIP (0-70) 0  0  (Blank rows = not tested)   UPPER EXTREMITY MMT:    Eval: WNL proximal to wrist in Lt arm, we will work into resistance in next week when safe, and wounds completely closed.   HAND FUNCTION: 12/28/21: Grip strength Right: 61 lbs, Left: 39 lbs   Eval: NT due to slightly open wound a tip of MF still and somewhat painful with pressure.   COORDINATION: 12/28/21: 9 Hole Peg Test Left: 88mn 8sec today; Box & Blocks Test LEFT: 37 blocks   Eval: decreased due to inability to move MF well now  SENSATION: Eval:  Light touch intact today, not overly sensitive to sx area    EDEMA:   12/28/21: 8.2cm circumferential base of Lt MF today  Eval:  Mildly swollen in Lt MF today  OBSERVATIONS:   12/28/21: wound still slightly open at tip of MF, though much improved and well healing. She doesn't always include MF in composite hand motion today at MCP or PIP J despite being able to move there (shows either awareness or neuro/proprioceptive issues or guarding from pain)   Eval: sx wound slightly open at tip of MF today, but otherwise looking very good. Sx site over volar MCP J completely healed.  She has no MF DIP motion (typical for loss of FDP).  IF  is somewhat stiff as well, but not SF or RF today.    TODAY'S TREATMENT:  12/28/21: She starts with AROM of hand for new measures and does seem to be improving despite wound not completely closed and finger still swollen. Due to those factors and stress at work, OT will withhold grip/pinch training for a week at least, and instead focus on neuro re-ed for proprioception and coordination with in-hand manipulations (translate, shift, rotate) with pen in clinic and added to HEP. She attempts fnl activities (box and blocks and 9HPT) but is keen to leave MF out of activities and  OT provides cues and also buddy strap to help with motion and to involve MF in activities. She struggles to learn new movement patterns with lack of FDP, but improves with practice.  OT also supplies her with edema control finger "sock" and coban wrap. OT does manual retrograde massage and scar mobs with edu for her to do at home as well ~2 x day or as needed. OT also uses vibration and edu on hand above heart to help with swelling as well. OT also reviews her HEP for tendon glides, finger stretches, wrist stretches, and she states understanding.      Eval: OT edu on keeping wound clean, covered until completely healed, applied bacitracin and bandaide.  OT also edu for WBAT with caution for healing wound; she was encouraged to use hand for as much functional activity as tolerated now.   She was given comprehensive HEP to begin 3-6 x day as tolerated, as below.  She repeats all exercises back to OT well, with no significant increase in pain.   She is also edu on scar mobilizations (for closed portions) and dorsal taping over MF to increase PROM.  She tolerates this for ~5 mins in clinic and her finger PROM greatly improves.  No questions or concerns at the end.   Exercises - Flex, then pull back wrist and fingers, hand on table hand on paper  - 3-4 x daily - 1-2 sets - 10-15 reps - Wrist Prayer Stretch  - 3-4 x daily - 3-5 reps - 15  sec hold - Tendon Glides  - 3-4 x daily - 1 sets - 5- 10 reps - 2-3 seconds hold - HOOK Stretch  - 3-4 x daily - 2-3 reps - 15-20 sec hold - Seated Finger Composite Flexion Stretch  - 3-4 x daily - 3-5 reps - 15 hold - Finger Extension or EDC Glides: Pen Roll From Fist to [employer]  - 4-6 x daily - 1 sets - 10-15 reps   PATIENT EDUCATION: Education details: See tx section above for details  Person educated: Patient Education method: Veterinary surgeon, Teach back, Handouts  Education comprehension: States and demonstrates understanding, Additional Education required    HOME EXERCISE PROGRAM: Access Code: [email or web address] URL: [email or web address]   GOALS: Goals reviewed with patient? Yes   SHORT TERM GOALS: (STG required if POC>30 days)  Pt will obtain protective, custom orthotic. Target date: TBD, as needed Goal status: INTIAL  2.  Pt will demo/state understanding of initial HEP to improve pain levels and prerequisite motion. Target date: 12/28/21 Goal status: 12/28/21: MET   LONG TERM GOALS:  Pt will improve functional ability by decreased impairment per Quick DASH assessment from 32% to 10% or better, for better quality of life. Target date: 01/19/22 Goal status: INITIAL  2.  Pt will improve grip strength in left hand to at least 45lbs for functional use at home and in IADLs. Target date: 01/19/22 Goal status: INITIAL  3.  Pt will improve A/ROM in Left MF TAM from 90* to at least 170*, to have functional motion for tasks like grasp and hold.  Target date: 01/19/22 Goal status: INITIAL  4.  Pt will decrease pain at worst from 5-6/10 to 1/10 or better to have better sleep and occupational participation in daily roles. Target date: 01/19/22 Goal status: INITIAL   ASSESSMENT:  CLINICAL IMPRESSION: 12/28/21: She shows some disuse of MF at times, some swelling and discomfort from increased use while at work, but overall is moving better  now.  We will continue  to work on motion and coordination, edema control, etc.   Eval: Patient is a 66 y.o. female who was seen today for occupational therapy evaluation for left hand FDP rupture and decreased ability now. She will benefit from OP OT.     PLAN: OT FREQUENCY: 1-2x/week  OT DURATION: 4 weeks (through 01/19/22)   PLANNED INTERVENTIONS: self care/ADL training, therapeutic exercise, therapeutic activity, neuromuscular re-education, manual therapy, scar mobilization, passive range of motion, splinting, ultrasound, fluidotherapy, compression bandaging, moist heat, cryotherapy, contrast bath, patient/family education, coping strategies training, and DME and/or AE instructions  RECOMMENDED OTHER SERVICES: none now   CONSULTED AND AGREED WITH PLAN OF CARE: Patient  PLAN FOR NEXT SESSION:  Continue to work on motion and coordination, edema control, etc. If finger healed, trial light grip, pinch as tolerated with putty    Benito Mccreedy, OTR/L, CHT 12/28/2021, 5:16 PM

## 2022-01-04 ENCOUNTER — Ambulatory Visit (INDEPENDENT_AMBULATORY_CARE_PROVIDER_SITE_OTHER): Payer: 59 | Admitting: Rehabilitative and Restorative Service Providers"

## 2022-01-04 ENCOUNTER — Telehealth: Payer: Self-pay | Admitting: Orthopedic Surgery

## 2022-01-04 ENCOUNTER — Encounter: Payer: Self-pay | Admitting: Rehabilitative and Restorative Service Providers"

## 2022-01-04 DIAGNOSIS — R278 Other lack of coordination: Secondary | ICD-10-CM

## 2022-01-04 DIAGNOSIS — M79642 Pain in left hand: Secondary | ICD-10-CM | POA: Diagnosis not present

## 2022-01-04 DIAGNOSIS — M25642 Stiffness of left hand, not elsewhere classified: Secondary | ICD-10-CM | POA: Diagnosis not present

## 2022-01-04 DIAGNOSIS — R600 Localized edema: Secondary | ICD-10-CM

## 2022-01-04 DIAGNOSIS — M6281 Muscle weakness (generalized): Secondary | ICD-10-CM

## 2022-01-04 NOTE — Telephone Encounter (Signed)
IC, spoke with patient (former [facility] patient).  I advised that the forms she dropped off for [employer] on 9/21 can't be completed here as Dr. Tempie Donning is no longer at this practice. I explained that I spoke with our clinic manager and was advised the forms need to be completed by staff at Dr. Madelynn Done new office where he can sign the forms as he cannot sign them here. She was somewhat upset as she has been waiting for the forms. She is coming in today for an O.T. appt at 4:00. I told her to ask for me, at that time I will give her back the $50 cash she paid to [employer].

## 2022-01-04 NOTE — Therapy (Signed)
OUTPATIENT OCCUPATIONAL THERAPY TREATMENT NOTE  Patient Name: Ashlee Williams MRN: [id number] DOB:04-21-1955, 66 y.o., female Today's Date: 01/04/2022  PCP: Annye Asa, MD REFERRING PROVIDER: Sherilyn Cooter, MD   OT End of Session - 01/04/22 1545     Visit Number 3    Number of Visits 6    Date for OT Re-Evaluation 01/19/22    OT Start Time 1545    OT Stop Time 1644    OT Time Calculation (min) 59 min    Activity Tolerance Patient tolerated treatment well;No increased pain;Patient limited by pain;Patient limited by fatigue    Behavior During Therapy [facility] for tasks assessed/performed               Past Medical History:  Diagnosis Date   AAA (abdominal aortic aneurysm) (HCC)    Allergy    Anemia    Asthma    Blood in stool    Bronchitis    Chicken pox    Chronic headaches    Diabetes mellitus    PT. IS TRYING TO CONTROL WITH DIET BUT AS NOTED WEIGHT IS UP   Hypertension    Obesity    Syncope and collapse 12/22/2016   Tobacco abuse    Past Surgical History:  Procedure Laterality Date   ABDOMINAL HYSTERECTOMY  04/09/1996   BREAST BIOPSY Right 12/07/2019   axilla/ neg   BREAST BIOPSY Right 12/07/2019   neg   CHOLECYSTECTOMY N/A 03/16/2015   Procedure: LAPAROSCOPIC CHOLECYSTECTOMY;  Surgeon: Judeth Horn, MD;  Location: [place];  Service: General;  Laterality: N/A;   FLEXOR TENDON REPAIR Left 12/04/2021   Procedure: LEFT MIDDLE FINGER FLEXOR DIGITORUM PROFUNDUS TENDON REPAIR;  Surgeon: Sherilyn Cooter, MD;  Location: [place];  Service: Orthopedics;  Laterality: Left;   Patient Active Problem List   Diagnosis Date Noted   Rupture of flexor tendon of left hand    [place] finger 11/23/2021   Chronic migraine w/o aura w/o status migrainosus, not intractable 11/29/2020   Pulmonary nodule 01/01/2017   Obstructive sleep apnea 06/11/2016   Mixed rhinitis 06/11/2016   Hyperlipidemia associated with type 2 diabetes mellitus ([place])  01/19/2015   Gallstones 11/18/2014   AAA (abdominal aortic aneurysm) ([place])    Vitamin D deficiency 06/07/2014   Physical exam 02/14/2011   Hand pain 02/05/2011   Headache 05/09/2007   Diet-controlled diabetes mellitus ([place]) 04/04/2007   MORBID OBESITY 04/04/2007   Tobacco user 04/04/2007   Essential hypertension 04/04/2007    ONSET DATE: DOS 12/04/21  REFERRING DIAG: M66.30 (ICD-10-CM) - Tendon rupture, nontraumatic, flexor  THERAPY DIAG:  Stiffness of left hand, not elsewhere classified  Pain in left hand  Localized edema  Muscle weakness (generalized)  Other lack of coordination  Rationale for Evaluation and Treatment Rehabilitation  PERTINENT HISTORY: Per MD: "Left middle finger attritional rupture of FDP tendon in palm.  Needs to work on edema control and maximizing FDS function."  PRECAUTIONS: 4+ weeks since initial sx now, but no repair so WBAT with caution for slightly open wound on fingertip still.   WEIGHT BEARING RESTRICTIONS No   SUBJECTIVE:   SUBJECTIVE STATEMENT: She states doing well, no pain, has been doing HEP. Typing has been somewhat difficult due to lack of DIP J flexion.     PAIN:  Are you having pain?   Not now at rest Rating: 0/10 at rest now, up to 0/10 at worst in past week using hand and stretching hand   PATIENT GOALS Get L hand  moving for ADLs as best as possible  OBJECTIVE: (All objective assessments below are from initial evaluation on: 12/21/21 unless otherwise specified.)    HAND DOMINANCE: Right   ADLs: Overall ADLs: Eval: States decreased ability to grab, hold household objects, pain and inability to open containers, perform all FMS tasks, etc.    FUNCTIONAL OUTCOME MEASURES: Eval: Quck DASH 32% impairment today   UPPER EXTREMITY ROM     12/28/21: Motion slightly better today:   IF: 1.8cm Gap MF: 7.5cm Gap RF: no gap, equal to right hand SF: no gap = to right hand   Eval: Lack of full fist in IF and MF, can oppose  and no loss of thumb motion.   IF: 2.8cm Gap MF: 7.8cm Gap RF: no gap, equal to right hand SF: no gap = to right hand  Active ROM Left eval Left 12/28/21 Lt 01/04/22  Long MCP (0-90) 0-55  0-60 0-67  Long PIP (0-100) 0-35  0-55 0-50 (PROM 82*)   Long DIP (0-70) 0  0 0   (Blank rows = not tested)   UPPER EXTREMITY MMT:    Eval: WNL proximal to wrist in Lt arm, we will work into resistance in next week when safe, and wounds completely closed.   HAND FUNCTION: 01/04/22: 37lbs   12/28/21: Grip strength Right: 61 lbs, Left: 39 lbs   Eval: NT due to slightly open wound a tip of MF still and somewhat painful with pressure.   COORDINATION: 12/28/21: 9 Hole Peg Test Left: 35mn 8sec today; Box & Blocks Test LEFT: 37 blocks   Eval: decreased due to inability to move MF well now  SENSATION: Eval:  Light touch intact today, not overly sensitive to sx area    EDEMA:   12/28/21: 8.2cm circumferential base of Lt MF today  Eval:  Mildly swollen in Lt MF today  OBSERVATIONS:   01/04/22: finger looks less swollen, scars more mobile in finger, wounds all now adequately closed.    TODAY'S TREATMENT:  01/04/22: She performs AROM for new measures, and records better at MCP J but slightly tighter at PIP J today.  DIP J of course has no active motion except relative extension.  OT performs manual therapy with review for scar mobs, today with IASTM tools. OT reviews stretches and tendon gliding as well before offering new HEP hand strength exercises with red putty (as below bolded) to work on FDP at other fingers, FDS to MF and overall functional pinch, grip, etc. She tolerates well with a nice stretch to MF volar scars but no pain.  Additionally she c/o problems typing with extended MF DIP J, so OT fabricates custom DIP J static finger-based orthotic that leaves PIP J free but passively bends DIP J to ~15* flexion.  She wears it during a typing activity and states it makes it easier to type somewhat,  and is not causing pressure or bothering her.   Exercises - Flex, then pull back wrist and fingers, hand on table hand on paper  - 3-4 x daily - 1-2 sets - 10-15 reps - Wrist Prayer Stretch  - 3-4 x daily - 3-5 reps - 15 sec hold - Tendon Glides  - 3-4 x daily - 1 sets - 5- 10 reps - 2-3 seconds hold - HOOK Stretch  - 3-4 x daily - 2-3 reps - 15-20 sec hold - Seated Finger Composite Flexion Stretch  - 3-4 x daily - 3-5 reps - 15 hold - Finger Extension or  EDC Glides: Pen Roll From Fist to [employer]  - 4-6 x daily - 1 sets - 10-15 reps - Full Fist  - 2-3 x daily - 5 reps - "Duck Mouth" Strength  - 2-3 x daily - 5 reps - Seated Claw Fist with Putty  - 2-3 x daily - 5 reps - Finger Extension "Pizza!"   - 2-3 x daily - 5 reps - Thumb Opposition with Putty  - 2-3 x daily - 5 reps   PATIENT EDUCATION: Education details: See tx section above for details  Person educated: Patient Education method: Verbal Instruction, Teach back, Handouts  Education comprehension: States and demonstrates understanding, Additional Education required    HOME EXERCISE PROGRAM: Access Code: [email or web address] URL: [email or web address]   GOALS: Goals reviewed with patient? Yes   SHORT TERM GOALS: (STG required if POC>30 days)  Pt will obtain protective, custom orthotic. Target date: 01/04/22 Goal status: MET- to help with typing skills   2.  Pt will demo/state understanding of initial HEP to improve pain levels and prerequisite motion. Target date: 12/28/21 Goal status: 12/28/21: MET   LONG TERM GOALS:  Pt will improve functional ability by decreased impairment per Quick DASH assessment from 32% to 10% or better, for better quality of life. Target date: 01/19/22 Goal status: INITIAL  2.  Pt will improve grip strength in left hand to at least 45lbs for functional use at home and in IADLs. Target date: 01/19/22 Goal status: INITIAL  3.  Pt will improve A/ROM in Left MF TAM from 90* to at least  170*, to have functional motion for tasks like grasp and hold.  Target date: 01/19/22 Goal status: INITIAL  4.  Pt will decrease pain at worst from 5-6/10 to 1/10 or better to have better sleep and occupational participation in daily roles. Target date: 01/19/22 Goal status: INITIAL   ASSESSMENT:  CLINICAL IMPRESSION: 01/04/22: She states understanding HEP, and OT has now given out all needed HEP.  She elects to f/u in 2 weeks after self-managing, to check motion, strength, ability, for possible D/C if all is well. This is a good plan, and OT has very little worries about her scars, etc., as they look better now and she should keep on HEP for stretches and scar mobs, etc.    PLAN: OT FREQUENCY: 1-2x/week  OT DURATION: 4 weeks (through 01/19/22)   PLANNED INTERVENTIONS: self care/ADL training, therapeutic exercise, therapeutic activity, neuromuscular re-education, manual therapy, scar mobilization, passive range of motion, splinting, ultrasound, fluidotherapy, compression bandaging, moist heat, cryotherapy, contrast bath, patient/family education, coping strategies training, and DME and/or AE instructions  RECOMMENDED OTHER SERVICES: none now   CONSULTED AND AGREED WITH PLAN OF CARE: Patient  PLAN FOR NEXT SESSION:  Reassess in 2 weeks to determine status and need for additional services.    Benito Mccreedy, OTR/L, CHT 01/04/2022, 4:59 PM

## 2022-01-12 ENCOUNTER — Telehealth: Payer: Self-pay | Admitting: Orthopedic Surgery

## 2022-01-12 NOTE — Telephone Encounter (Signed)
on 01/04/22 patient came in and was given her money back that was paid to [employer] for forms. She voiced complaints (see previous msg) and wanted to speak to the Engineer, building services. After Randall Hiss spoke with her, it was settled/agreed and satisfied the patient that the completed forms would be emailed to Dr. Tempie Donning and he will sign and send back to our office where we will fax back. Today, 10/6 I received the signed [place] and [employer] forms via email from Environmental education officer. I faxed [employer] forms to [employer] at [phone], faxed [place] form to [place] at [phone]. Forms are scanned in chart.

## 2022-01-18 ENCOUNTER — Ambulatory Visit: Payer: 59 | Admitting: Rehabilitative and Restorative Service Providers"

## 2022-01-18 ENCOUNTER — Encounter: Payer: Self-pay | Admitting: Rehabilitative and Restorative Service Providers"

## 2022-01-18 DIAGNOSIS — M25642 Stiffness of left hand, not elsewhere classified: Secondary | ICD-10-CM

## 2022-01-18 DIAGNOSIS — M6281 Muscle weakness (generalized): Secondary | ICD-10-CM | POA: Diagnosis not present

## 2022-01-18 DIAGNOSIS — R600 Localized edema: Secondary | ICD-10-CM | POA: Diagnosis not present

## 2022-01-18 DIAGNOSIS — R278 Other lack of coordination: Secondary | ICD-10-CM

## 2022-01-18 DIAGNOSIS — M79642 Pain in left hand: Secondary | ICD-10-CM | POA: Diagnosis not present

## 2022-01-18 NOTE — Therapy (Signed)
OUTPATIENT OCCUPATIONAL THERAPY TREATMENT & DISCHARGE NOTE  Patient Name: Ashlee Williams MRN: [id number] DOB:[birth year], 66 y.o., female Today's Date: 01/18/2022  PCP: Annye Asa, MD REFERRING PROVIDER: Sherilyn Cooter, MD   OT End of Session - 01/18/22 1527     Visit Number 4    Number of Visits 6    Date for OT Re-Evaluation 01/19/22    OT Start Time 4782    OT Stop Time 1614    OT Time Calculation (min) 47 min    Equipment Utilized During Treatment orthotic materials    Activity Tolerance Patient tolerated treatment well;No increased pain;Patient limited by pain;Patient limited by fatigue    Behavior During Therapy [facility] for tasks assessed/performed                Past Medical History:  Diagnosis Date   AAA (abdominal aortic aneurysm) (HCC)    Allergy    Anemia    Asthma    Blood in stool    Bronchitis    Chicken pox    Chronic headaches    Diabetes mellitus    PT. IS TRYING TO CONTROL WITH DIET BUT AS NOTED WEIGHT IS UP   Hypertension    Obesity    Syncope and collapse 12/22/2016   Tobacco abuse    Past Surgical History:  Procedure Laterality Date   ABDOMINAL HYSTERECTOMY  04/09/1996   BREAST BIOPSY Right 12/07/2019   axilla/ neg   BREAST BIOPSY Right 12/07/2019   neg   CHOLECYSTECTOMY N/A 03/16/2015   Procedure: LAPAROSCOPIC CHOLECYSTECTOMY;  Surgeon: Judeth Horn, MD;  Location: [place];  Service: General;  Laterality: N/A;   FLEXOR TENDON REPAIR Left 12/04/2021   Procedure: LEFT MIDDLE FINGER FLEXOR DIGITORUM PROFUNDUS TENDON REPAIR;  Surgeon: Sherilyn Cooter, MD;  Location: [place];  Service: Orthopedics;  Laterality: Left;   Patient Active Problem List   Diagnosis Date Noted   Rupture of flexor tendon of left hand    [place] finger 11/23/2021   Chronic migraine w/o aura w/o status migrainosus, not intractable 11/29/2020   Pulmonary nodule 01/01/2017   Obstructive sleep apnea 06/11/2016   Mixed rhinitis  06/11/2016   Hyperlipidemia associated with type 2 diabetes mellitus ([place]) 01/19/2015   Gallstones 11/18/2014   AAA (abdominal aortic aneurysm) ([place])    Vitamin D deficiency 06/07/2014   Physical exam 02/14/2011   Hand pain 02/05/2011   Headache 05/09/2007   Diet-controlled diabetes mellitus ([place]) 04/04/2007   MORBID OBESITY 04/04/2007   Tobacco user 04/04/2007   Essential hypertension 04/04/2007    ONSET DATE: DOS 12/04/21  REFERRING DIAG: M66.30 (ICD-10-CM) - Tendon rupture, nontraumatic, flexor  THERAPY DIAG:  Stiffness of left hand, not elsewhere classified  Pain in left hand  Muscle weakness (generalized)  Localized edema  Other lack of coordination  Rationale for Evaluation and Treatment Rehabilitation  PERTINENT HISTORY: Per MD: "Left middle finger attritional rupture of FDP tendon in palm.  Needs to work on edema control and maximizing FDS function."  PRECAUTIONS: 6+ weeks since initial sx now, but no repair so WBAT with caution for slightly open wound on fingertip still.   WEIGHT BEARING RESTRICTIONS No   SUBJECTIVE:   SUBJECTIVE STATEMENT: She come in for f/u after 2 weeks of self-management, states she is managing as best she can. Tip is still somewhat tight feeling and sensitive if it "bangs into" something.    PAIN:  Are you having pain?   Not now at rest Rating: 0/10 at rest now,  up to 5/10 at worst in past week using hand and stretching hand   PATIENT GOALS Get L hand moving for ADLs as best as possible   OBJECTIVE: (All objective assessments below are from initial evaluation on: 12/21/21 unless otherwise specified.)    HAND DOMINANCE: Right   ADLs: Overall ADLs: 01/18/22: Still having mild problems with jars and carrying bags in hook fist, but others much better  FUNCTIONAL OUTCOME MEASURES: 01/18/22: 16% Quick DASH today   Eval: Quck DASH 32% impairment today   UPPER EXTREMITY ROM    01/18/22:  IF: 1cm Gap MF: 5cm Gap RF: no gap,  equal to right hand SF: no gap = to right hand   Eval: Lack of full fist in IF and MF, can oppose and no loss of thumb motion.   IF: 2.8cm Gap MF: 7.8cm Gap RF: no gap, equal to right hand SF: no gap = to right hand  Active ROM Left eval Left 12/28/21 Lt 01/04/22 Lt  01/18/22  Long MCP (0-90) 0-55  0-60 0-67 0-81*  Long PIP (0-100) 0-35  0-55 0-50 (PROM 82*)  0-66* (PROM 80*)   Long DIP (0-70) 0  0 0  0  (Blank rows = not tested)   UPPER EXTREMITY MMT:    Eval: WFL   HAND FUNCTION: 01/18/22: Lt 44 #   01/04/22: 37lbs   COORDINATION: 01/18/22: 35 sec today Lt 9HPT (31sec with finger tip orthotic on)  12/28/21: 9 Hole Peg Test Left: 44mn 8sec today; Box & Blocks Test LEFT: 37 blocks   SENSATION: 01/18/22: still mildly numb/sensitive at tip of MF only now   EDEMA:  01/18/22: 7.5cm circumferential base of Lt MF today (opposite MF: 7cm)   12/28/21: 8.2cm circumferential base of Lt MF today  OBSERVATIONS:   01/18/22: less swelling and scars more mobile. Still slightly pink, healing and mildly hypersensitive at tip of MF, but greatly improved.    TODAY'S TREATMENT:  01/18/22: She does gripping, AROM, and fnl activities for therapy and new measures today.  She does much better on all, seems very functional now. OT reviews goals and fnl abilities with her, making final recommendations. OT does manual vibration with edu for continued desensitization- she states this is helpful. She asks for a backup typing orthotic, and OT is able to quickly make her another.  OT also gives final recommendations, tailors HEP to the below. She states understanding, happy with where she is and ability to continue to self-manage her remaining issues.   Exercises - Wrist Prayer Stretch  - 3-4 x weekly - 3-5 reps - 15 sec hold - HOOK Stretch  - 3-4 x weekly - 2-3 reps - 15-20 sec hold - Seated Finger Composite Flexion Stretch  - 3-4 x weekly - 3-5 reps - 15 hold - Full Fist  - 3-4 x weekly - 5  reps   PATIENT EDUCATION: Education details: See tx section above for details  Person educated: Patient Education method: Verbal Instruction, Teach back, Handouts  Education comprehension: States and demonstrates understanding   HOME EXERCISE PROGRAM: Access Code: [email or web address] URL: [email or web address]   GOALS: Goals reviewed with patient? Yes   SHORT TERM GOALS: (STG required if POC>30 days)  Pt will obtain protective, custom orthotic. Target date: 01/04/22 Goal status: MET- to help with typing skills   2.  Pt will demo/state understanding of initial HEP to improve pain levels and prerequisite motion. Target date: 12/28/21 Goal status: 12/28/21: MET   LONG TERM GOALS:  Pt  will improve functional ability by decreased impairment per Quick DASH assessment from 32% to 10% or better, for better quality of life. Target date: 01/19/22 Goal status: 01/18/22: Partially Met 16%   2.  Pt will improve grip strength in left hand to at least 45lbs for functional use at home and in IADLs. Target date: 01/19/22 Goal status: 01/18/22: Considered met- 44# now  3.  Pt will improve A/ROM in Left MF TAM from 90* to at least 170*, to have functional motion for tasks like grasp and hold.  Target date: 01/19/22 Goal status: 01/18/22: Partially met- 147*   4.  Pt will decrease pain at worst from 5-6/10 to 1/10 or better to have better sleep and occupational participation in daily roles. Target date: 01/19/22 Goal status: 01/18/22: Progressing- still states 5/10 at worst, but this is very infrequent now and expected to resolve as sensation improves.    ASSESSMENT:  CLINICAL IMPRESSION: 01/18/22: Meets D/C requirements today, she is pleased with progress, no significant issues remain.  OT does recommend for her to consider fnl fusion with DIP J in slight flexion if she is unhappy with fnl ability with DIP J in full ext. She may also try to wear DIP J flexion/typing orthotic  day/night to create fnl contracture in slight flexion as well.    PLAN: OT FREQUENCY: D/C  OT DURATION: D/C  PLANNED INTERVENTIONS: self care/ADL training, therapeutic exercise, therapeutic activity, neuromuscular re-education, manual therapy, scar mobilization, passive range of motion, splinting, ultrasound, fluidotherapy, compression bandaging, moist heat, cryotherapy, contrast bath, patient/family education, coping strategies training, and DME and/or AE instructions  RECOMMENDED OTHER SERVICES: none now   CONSULTED AND AGREED WITH PLAN OF CARE: Patient  PLAN FOR NEXT SESSION:  D/C successfully today    Benito Mccreedy, OTR/L, CHT 01/18/2022, 5:38 PM    OCCUPATIONAL THERAPY DISCHARGE SUMMARY  Visits from Start of Care: 4  Current functional level related to goals / functional outcomes: Pt has met most goals to satisfactory levels and is pleased with outcomes.   Remaining deficits: Pt has no more significant functional deficits or pain.   Education / Equipment: Pt has all needed materials and education. Pt understands how to continue on with self-management. See tx notes for more details.   Patient agrees to discharge due to max benefits received from outpatient occupational therapy / hand therapy at this time.   Benito Mccreedy, OTR/L, CHT 01/18/22

## 2022-01-22 ENCOUNTER — Other Ambulatory Visit (HOSPITAL_COMMUNITY): Payer: Self-pay

## 2022-02-07 ENCOUNTER — Encounter: Payer: Self-pay | Admitting: Family Medicine

## 2022-02-07 ENCOUNTER — Ambulatory Visit (INDEPENDENT_AMBULATORY_CARE_PROVIDER_SITE_OTHER): Payer: 59 | Admitting: Family Medicine

## 2022-02-07 VITALS — BP 118/80 | HR 63 | Temp 97.9°F | Resp 17 | Ht 63.0 in | Wt 263.2 lb

## 2022-02-07 DIAGNOSIS — E119 Type 2 diabetes mellitus without complications: Secondary | ICD-10-CM | POA: Diagnosis not present

## 2022-02-07 DIAGNOSIS — I714 Abdominal aortic aneurysm, without rupture, unspecified: Secondary | ICD-10-CM | POA: Diagnosis not present

## 2022-02-07 DIAGNOSIS — E559 Vitamin D deficiency, unspecified: Secondary | ICD-10-CM

## 2022-02-07 DIAGNOSIS — Z23 Encounter for immunization: Secondary | ICD-10-CM | POA: Diagnosis not present

## 2022-02-07 DIAGNOSIS — Z0000 Encounter for general adult medical examination without abnormal findings: Secondary | ICD-10-CM | POA: Diagnosis not present

## 2022-02-07 LAB — CBC WITH DIFFERENTIAL/PLATELET
Basophils Absolute: 0 10*3/uL (ref 0.0–0.1)
Basophils Relative: 0.8 % (ref 0.0–3.0)
Eosinophils Absolute: 0.1 10*3/uL (ref 0.0–0.7)
Eosinophils Relative: 2.2 % (ref 0.0–5.0)
HCT: 38.8 % (ref 36.0–46.0)
Hemoglobin: 12.3 g/dL (ref 12.0–15.0)
Lymphocytes Relative: 44.7 % (ref 12.0–46.0)
Lymphs Abs: 2.6 10*3/uL (ref 0.7–4.0)
MCHC: 31.7 g/dL (ref 30.0–36.0)
MCV: 83.4 fl (ref 78.0–100.0)
Monocytes Absolute: 0.5 10*3/uL (ref 0.1–1.0)
Monocytes Relative: 7.8 % (ref 3.0–12.0)
Neutro Abs: 2.6 10*3/uL (ref 1.4–7.7)
Neutrophils Relative %: 44.5 % (ref 43.0–77.0)
Platelets: 211 10*3/uL (ref 150.0–400.0)
RBC: 4.65 Mil/uL (ref 3.87–5.11)
RDW: 15.4 % (ref 11.5–15.5)
WBC: 5.9 10*3/uL (ref 4.0–10.5)

## 2022-02-07 LAB — HEPATIC FUNCTION PANEL
ALT: 11 U/L (ref 0–35)
AST: 14 U/L (ref 0–37)
Albumin: 3.9 g/dL (ref 3.5–5.2)
Alkaline Phosphatase: 59 U/L (ref 39–117)
Bilirubin, Direct: 0.1 mg/dL (ref 0.0–0.3)
Total Bilirubin: 0.4 mg/dL (ref 0.2–1.2)
Total Protein: 7.1 g/dL (ref 6.0–8.3)

## 2022-02-07 LAB — VITAMIN D 25 HYDROXY (VIT D DEFICIENCY, FRACTURES): VITD: 40.57 ng/mL (ref 30.00–100.00)

## 2022-02-07 LAB — BASIC METABOLIC PANEL
BUN: 20 mg/dL (ref 6–23)
CO2: 32 mEq/L (ref 19–32)
Calcium: 9.4 mg/dL (ref 8.4–10.5)
Chloride: 106 mEq/L (ref 96–112)
Creatinine, Ser: 0.86 mg/dL (ref 0.40–1.20)
GFR: 71.01 mL/min (ref >60.00)
Glucose, Bld: 84 mg/dL (ref 70–99)
Potassium: 4.3 mEq/L (ref 3.5–5.1)
Sodium: 142 mEq/L (ref 135–145)

## 2022-02-07 LAB — LIPID PANEL
Cholesterol: 112 mg/dL (ref 0–200)
HDL: 41.8 mg/dL (ref >39.00)
LDL Cholesterol: 52 mg/dL (ref 0–99)
NonHDL: 70.34
Total CHOL/HDL Ratio: 3
Triglycerides: 94 mg/dL (ref 0.0–149.0)
VLDL: 18.8 mg/dL (ref 0.0–40.0)

## 2022-02-07 LAB — TSH: TSH: 0.94 u[IU]/mL (ref 0.35–5.50)

## 2022-02-07 LAB — HEMOGLOBIN A1C: Hgb A1c MFr Bld: 6.6 % — ABNORMAL HIGH (ref 4.6–6.5)

## 2022-02-07 NOTE — Assessment & Plan Note (Signed)
Pt's PE WNL w/ exception of obesity.  UTD on mammo, colonoscopy, Tdap, eye exam, microalbumin.  Prevar 20 and Shingrix given today.  Check labs.  Anticipatory guidance provided.

## 2022-02-07 NOTE — Assessment & Plan Note (Signed)
Continues to follow w/ vascular

## 2022-02-07 NOTE — Assessment & Plan Note (Signed)
Ongoing issue for pt.  Hx of excellent A1C control.  UTD on eye exam, microalbumin.  Foot exam done today.  Encouraged low carb diet and regular physical activity.  Check labs and determine if medication is needed

## 2022-02-07 NOTE — Patient Instructions (Addendum)
Follow up in 6 months to recheck sugar and get 2nd shingles shot We'll notify you of your lab results and make any changes if needed Continue to work on healthy diet and regular exercise- you can do it! Call with any questions or concerns Stay Safe!  Stay Healthy! Happy Fall!!!

## 2022-02-07 NOTE — Assessment & Plan Note (Signed)
Check labs and replete prn. 

## 2022-02-07 NOTE — Progress Notes (Signed)
Subjective:    Patient ID: Ashlee Williams, female    DOB: 1955-12-07, 66 y.o.   MRN: [id number]  HPI CPE- UTD on mammo, colonoscopy, Tdap, eye exam, microalbumin.  Due for foot exam, A1C, Prevnar 20  Patient Care Team    Relationship Specialty Notifications Start End  Midge Minium, MD PCP - General Family Medicine  02/05/11    Comment: Merged (Merged)  Ralene Bathe, MD Consulting Physician Ophthalmology  09/05/17   Early, Arvilla Meres, MD Consulting Physician Vascular Surgery  09/05/17   Deneise Lever, MD Consulting Physician Pulmonary Disease  09/05/17   Gatha Mayer, MD Consulting Physician Gastroenterology  09/05/17      Health Maintenance  Topic Date Due  . Pneumonia Vaccine 4+ Years old (2 - PCV) 12/04/2021  . FOOT EXAM  01/26/2022  . HEMOGLOBIN A1C  02/02/2022  . Zoster Vaccines- Shingrix (1 of 2) 05/10/2022 (Originally 12/05/2006)  . MAMMOGRAM  04/14/2022  . Diabetic kidney evaluation - Urine ACR  08/04/2022  . OPHTHALMOLOGY EXAM  10/06/2022  . Diabetic kidney evaluation - GFR measurement  12/02/2022  . COLONOSCOPY (Pts 45-71yrs Insurance coverage will need to be confirmed)  04/14/2024  . TETANUS/TDAP  10/27/2030  . INFLUENZA VACCINE  Completed  . DEXA SCAN  Completed  . Hepatitis C Screening  Completed  . HIV Screening  Completed  . HPV VACCINES  Aged Out  . COVID-19 Vaccine  Discontinued      Review of Systems Patient reports no vision/ hearing changes, adenopathy,fever, weight change, swallowing issues, chest pain, palpitations, edema, persistant/recurrent cough, hemoptysis, dyspnea (rest/exertional/paroxysmal nocturnal), gastrointestinal bleeding (melena, rectal bleeding), abdominal pain, significant heartburn, bowel changes, GU symptoms (dysuria, hematuria, incontinence), Gyn symptoms (abnormal  bleeding, pain),  syncope, focal weakness, memory loss, numbness & tingling, skin/hair/nail changes, abnormal bruising or bleeding, anxiety, or depression.    + hoarseness- due to weather change and frequent singing    Objective:   Physical Exam General Appearance:    Alert, cooperative, no distress, appears stated age, obese  Head:    Normocephalic, without obvious abnormality, atraumatic  Eyes:    PERRL, conjunctiva/corneas clear, EOM's intact both eyes  Ears:    Normal TM's and external ear canals, both ears  Nose:   Nares normal, septum midline, mucosa normal, no drainage    or sinus tenderness  Throat:   Lips, mucosa, and tongue normal; teeth and gums normal  Neck:   Supple, symmetrical, trachea midline, no adenopathy;    Thyroid: no enlargement/tenderness/nodules  Back:     Symmetric, no curvature, ROM normal, no CVA tenderness  Lungs:     Clear to auscultation bilaterally, respirations unlabored  Chest Wall:    No tenderness or deformity   Heart:    Regular rate and rhythm, S1 and S2 normal, no murmur, rub   or gallop  Breast Exam:    Deferred to mammo  Abdomen:     Soft, non-tender, bowel sounds active all four quadrants,    no masses, no organomegaly  Genitalia:    Deferred  Rectal:    Extremities:   Extremities normal, atraumatic, no cyanosis or edema  Pulses:   2+ and symmetric all extremities  Skin:   Skin color, texture, turgor normal, no rashes or lesions  Lymph nodes:   Cervical, supraclavicular, and axillary nodes normal  Neurologic:   CNII-XII intact, normal strength, sensation and reflexes    throughout          Assessment & Plan:

## 2022-02-09 ENCOUNTER — Other Ambulatory Visit: Payer: Self-pay | Admitting: Family Medicine

## 2022-02-09 ENCOUNTER — Other Ambulatory Visit: Payer: Self-pay | Admitting: Lab

## 2022-02-09 ENCOUNTER — Other Ambulatory Visit (HOSPITAL_COMMUNITY): Payer: Self-pay

## 2022-02-09 DIAGNOSIS — I10 Essential (primary) hypertension: Secondary | ICD-10-CM

## 2022-02-09 MED ORDER — LOSARTAN POTASSIUM-HCTZ 100-12.5 MG PO TABS
1.0000 | ORAL_TABLET | Freq: Every day | ORAL | 0 refills | Status: DC
  Filled 2022-02-09: qty 90, 90d supply, fill #0

## 2022-02-09 NOTE — Progress Notes (Signed)
Informed pt of lab results  

## 2022-02-12 DIAGNOSIS — H524 Presbyopia: Secondary | ICD-10-CM | POA: Diagnosis not present

## 2022-03-07 ENCOUNTER — Other Ambulatory Visit (HOSPITAL_COMMUNITY): Payer: Self-pay

## 2022-05-15 ENCOUNTER — Other Ambulatory Visit: Payer: Self-pay | Admitting: Family Medicine

## 2022-05-15 DIAGNOSIS — Z1231 Encounter for screening mammogram for malignant neoplasm of breast: Secondary | ICD-10-CM

## 2022-05-16 ENCOUNTER — Other Ambulatory Visit: Payer: Self-pay | Admitting: Family Medicine

## 2022-05-16 DIAGNOSIS — I10 Essential (primary) hypertension: Secondary | ICD-10-CM

## 2022-05-17 ENCOUNTER — Other Ambulatory Visit (HOSPITAL_COMMUNITY): Payer: Self-pay

## 2022-05-17 MED ORDER — LOSARTAN POTASSIUM-HCTZ 100-12.5 MG PO TABS
1.0000 | ORAL_TABLET | Freq: Every day | ORAL | 0 refills | Status: DC
  Filled 2022-05-17: qty 90, 90d supply, fill #0

## 2022-06-27 ENCOUNTER — Ambulatory Visit
Admission: RE | Admit: 2022-06-27 | Discharge: 2022-06-27 | Disposition: A | Discharge status: Home / Self Care | Payer: 59 | Source: Ambulatory Visit | Attending: Family Medicine | Admitting: Family Medicine

## 2022-06-27 DIAGNOSIS — Z1231 Encounter for screening mammogram for malignant neoplasm of breast: Secondary | ICD-10-CM | POA: Diagnosis not present

## 2022-08-08 ENCOUNTER — Ambulatory Visit: Payer: 59 | Admitting: Family Medicine

## 2022-08-16 ENCOUNTER — Telehealth: Payer: Self-pay

## 2022-08-16 ENCOUNTER — Encounter: Payer: Self-pay | Admitting: Family Medicine

## 2022-08-16 ENCOUNTER — Other Ambulatory Visit (HOSPITAL_COMMUNITY): Payer: Self-pay

## 2022-08-16 ENCOUNTER — Ambulatory Visit (INDEPENDENT_AMBULATORY_CARE_PROVIDER_SITE_OTHER): Payer: 59 | Admitting: Family Medicine

## 2022-08-16 VITALS — BP 128/86 | HR 81 | Temp 97.9°F | Resp 18 | Ht 63.0 in | Wt 267.0 lb

## 2022-08-16 DIAGNOSIS — J209 Acute bronchitis, unspecified: Secondary | ICD-10-CM

## 2022-08-16 DIAGNOSIS — E785 Hyperlipidemia, unspecified: Secondary | ICD-10-CM

## 2022-08-16 DIAGNOSIS — E1169 Type 2 diabetes mellitus with other specified complication: Secondary | ICD-10-CM

## 2022-08-16 DIAGNOSIS — E119 Type 2 diabetes mellitus without complications: Secondary | ICD-10-CM | POA: Diagnosis not present

## 2022-08-16 DIAGNOSIS — I10 Essential (primary) hypertension: Secondary | ICD-10-CM | POA: Diagnosis not present

## 2022-08-16 DIAGNOSIS — Z23 Encounter for immunization: Secondary | ICD-10-CM

## 2022-08-16 LAB — MICROALBUMIN / CREATININE URINE RATIO
Creatinine,U: 166.7 mg/dL
Microalb Creat Ratio: 0.4 mg/g (ref 0.0–30.0)
Microalb, Ur: <0.7 mg/dL (ref 0.0–1.9)

## 2022-08-16 LAB — HEPATIC FUNCTION PANEL
ALT: 15 U/L (ref 0–35)
AST: 15 U/L (ref 0–37)
Albumin: 3.7 g/dL (ref 3.5–5.2)
Alkaline Phosphatase: 56 U/L (ref 39–117)
Bilirubin, Direct: 0.1 mg/dL (ref 0.0–0.3)
Total Bilirubin: 0.4 mg/dL (ref 0.2–1.2)
Total Protein: 7 g/dL (ref 6.0–8.3)

## 2022-08-16 LAB — CBC WITH DIFFERENTIAL/PLATELET
Basophils Absolute: 0.1 10*3/uL (ref 0.0–0.1)
Basophils Relative: 1.1 % (ref 0.0–3.0)
Eosinophils Absolute: 0.1 10*3/uL (ref 0.0–0.7)
Eosinophils Relative: 2 % (ref 0.0–5.0)
HCT: 40.3 % (ref 36.0–46.0)
Hemoglobin: 12.9 g/dL (ref 12.0–15.0)
Lymphocytes Relative: 41.1 % (ref 12.0–46.0)
Lymphs Abs: 2 10*3/uL (ref 0.7–4.0)
MCHC: 32 g/dL (ref 30.0–36.0)
MCV: 83.3 fl (ref 78.0–100.0)
Monocytes Absolute: 0.5 10*3/uL (ref 0.1–1.0)
Monocytes Relative: 9.2 % (ref 3.0–12.0)
Neutro Abs: 2.3 10*3/uL (ref 1.4–7.7)
Neutrophils Relative %: 46.6 % (ref 43.0–77.0)
Platelets: 200 10*3/uL (ref 150.0–400.0)
RBC: 4.85 Mil/uL (ref 3.87–5.11)
RDW: 15.3 % (ref 11.5–15.5)
WBC: 5 10*3/uL (ref 4.0–10.5)

## 2022-08-16 LAB — BASIC METABOLIC PANEL
BUN: 17 mg/dL (ref 6–23)
CO2: 30 mEq/L (ref 19–32)
Calcium: 9.5 mg/dL (ref 8.4–10.5)
Chloride: 104 mEq/L (ref 96–112)
Creatinine, Ser: 0.96 mg/dL (ref 0.40–1.20)
GFR: 62.01 mL/min (ref >60.00)
Glucose, Bld: 81 mg/dL (ref 70–99)
Potassium: 4.1 mEq/L (ref 3.5–5.1)
Sodium: 142 mEq/L (ref 135–145)

## 2022-08-16 LAB — LIPID PANEL
Cholesterol: 118 mg/dL (ref 0–200)
HDL: 40.8 mg/dL (ref >39.00)
LDL Cholesterol: 60 mg/dL (ref 0–99)
NonHDL: 77.26
Total CHOL/HDL Ratio: 3
Triglycerides: 87 mg/dL (ref 0.0–149.0)
VLDL: 17.4 mg/dL (ref 0.0–40.0)

## 2022-08-16 LAB — HEMOGLOBIN A1C: Hgb A1c MFr Bld: 6.3 % (ref 4.6–6.5)

## 2022-08-16 LAB — TSH: TSH: 1.31 u[IU]/mL (ref 0.35–5.50)

## 2022-08-16 MED ORDER — ATORVASTATIN CALCIUM 10 MG PO TABS
10.0000 mg | ORAL_TABLET | Freq: Every day | ORAL | 1 refills | Status: DC
  Filled 2022-08-16: qty 90, 90d supply, fill #0
  Filled 2022-11-21 – 2022-11-22 (×2): qty 90, 90d supply, fill #1

## 2022-08-16 MED ORDER — AZITHROMYCIN 250 MG PO TABS
ORAL_TABLET | ORAL | 0 refills | Status: AC
  Filled 2022-08-16: qty 6, 5d supply, fill #0

## 2022-08-16 MED ORDER — AMLODIPINE BESYLATE 5 MG PO TABS
5.0000 mg | ORAL_TABLET | Freq: Every day | ORAL | 1 refills | Status: DC
  Filled 2022-08-16: qty 90, 90d supply, fill #0
  Filled 2023-02-19: qty 90, 90d supply, fill #1

## 2022-08-16 MED ORDER — ALBUTEROL SULFATE HFA 108 (90 BASE) MCG/ACT IN AERS
2.0000 | INHALATION_SPRAY | RESPIRATORY_TRACT | 6 refills | Status: AC | PRN
  Filled 2022-08-16: qty 6.7, 16d supply, fill #0

## 2022-08-16 MED ORDER — LOSARTAN POTASSIUM-HCTZ 100-12.5 MG PO TABS
1.0000 | ORAL_TABLET | Freq: Every day | ORAL | 1 refills | Status: DC
  Filled 2022-08-16: qty 90, 90d supply, fill #0
  Filled 2023-02-19: qty 90, 90d supply, fill #1

## 2022-08-16 NOTE — Patient Instructions (Signed)
Schedule your complete physical in 6 months We'll notify you of your lab results and make any changes if needed START the Zpack as directed USE the Albuterol as needed Make sure you are taking a daily Claritin or Zyrtec to help w/ drainage and congestion Continue to work on low carb, low sugar diet and regular physical activity- you can do it!!! Call with any questions or concerns Stay Safe!  Stay Healthy! HAPPY RETIREMENT!!!

## 2022-08-16 NOTE — Progress Notes (Signed)
   Subjective:    Patient ID: Ashlee Williams, female    DOB: 05-16-1955, 67 y.o.   MRN: [id number]  HPI DM- ongoing issue.  Currently diet controlled.  UTD on eye exam, foot exam.  Due for microalbumin.  No numbness/tingling of hands/feet.  HTN- chronic problem, on Amlodipine 5mg  daily, Losartan HCTZ 100/12.5mg  daily w/ good control.  No CP, SOB, HA's, visual changes  Hyperlipidemia- chronic problem, on Lipitor 10mg  daily.  No abd pain, N/V.  Hoarseness- pt reports sxs started earlier this week.  + cough- mildly productive.  No fevers.  Faint wheezing.   Review of Systems For ROS see HPI     Objective:   Physical Exam Constitutional:      General: She is not in acute distress.    Appearance: Normal appearance. She is well-developed. She is obese. She is not ill-appearing.  HENT:     Head: Normocephalic and atraumatic.  Eyes:     Conjunctiva/sclera: Conjunctivae normal.     Pupils: Pupils are equal, round, and reactive to light.  Neck:     Thyroid: No thyromegaly.  Cardiovascular:     Rate and Rhythm: Normal rate and regular rhythm.     Heart sounds: Normal heart sounds. No murmur heard. Pulmonary:     Effort: Pulmonary effort is normal. No respiratory distress.     Comments: Decreased breath sounds throughout but no audible wheezes or crackles Abdominal:     General: There is no distension.     Palpations: Abdomen is soft.     Tenderness: There is no abdominal tenderness.  Musculoskeletal:     Cervical back: Normal range of motion and neck supple.  Lymphadenopathy:     Cervical: No cervical adenopathy.  Skin:    General: Skin is warm and dry.  Neurological:     Mental Status: She is alert and oriented to person, place, and time.  Psychiatric:        Behavior: Behavior normal.           Assessment & Plan:   Bronchitis- new.  Pt has extensive smoking history which puts her at higher risk for bacterial infxns.  + cough, decreased air movement.  Start  Zpack.  Refill albuterol.  Reviewed supportive care and red flags that should prompt return.  Pt expressed understanding and is in agreement w/ plan.

## 2022-08-16 NOTE — Assessment & Plan Note (Signed)
Chronic problem.  Currently on Lipitor 10mg daily w/o difficulty.  Check labs.  Adjust meds prn  

## 2022-08-16 NOTE — Assessment & Plan Note (Signed)
Ongoing issue.  Unfortunately pt has gained 4 lbs since last visit.  Encouraged low carb diet, regular physical activity.  UTD on eye exam, foot exam.  Microalbumin ordered.  Check labs and determine if medication is needed

## 2022-08-16 NOTE — Telephone Encounter (Signed)
-----   Message from Sheliah Hatch, MD sent at 08/16/2022  3:15 PM EDT ----- Labs look great!  No changes at this time

## 2022-08-16 NOTE — Assessment & Plan Note (Signed)
Chronic problem.  On Amlodipine 5mg  daily and Losartan HCTZ 100/12.5mg  daily w/ good control.  Currently asymptomatic.  Check labs due to ARB and diuretic use but no anticipated med changes.  Will follow.

## 2022-08-16 NOTE — Telephone Encounter (Signed)
Pt aware of results 

## 2022-11-22 ENCOUNTER — Other Ambulatory Visit: Payer: Self-pay

## 2022-11-22 ENCOUNTER — Other Ambulatory Visit (HOSPITAL_COMMUNITY): Payer: Self-pay

## 2022-12-01 LAB — AMB RESULTS CONSOLE CBG: Glucose: 103

## 2022-12-01 NOTE — Progress Notes (Signed)
Pt has PCP and no SDOH needs at this time. 

## 2022-12-05 ENCOUNTER — Encounter: Payer: Self-pay | Admitting: *Deleted

## 2022-12-05 NOTE — Progress Notes (Signed)
Pt attended 12/01/22 screening event where her b/p was 122/84 and her blood sugar was 103. At the event the pt noted her PCP is Dr. Neena Rhymes at [facility], and did not identify any SDOH insecurities. Chart review confirms pt saw Dr. Beverely Low most recently on 08/16/22 and has a future appt with her on 02/18/23. No additional health equity team support indicated at this time.

## 2023-02-18 ENCOUNTER — Encounter: Payer: Self-pay | Admitting: Family Medicine

## 2023-02-18 ENCOUNTER — Ambulatory Visit: Payer: Medicare HMO | Admitting: Family Medicine

## 2023-02-18 VITALS — BP 158/84 | HR 66 | Temp 97.8°F | Ht 63.0 in | Wt 258.4 lb

## 2023-02-18 DIAGNOSIS — E559 Vitamin D deficiency, unspecified: Secondary | ICD-10-CM

## 2023-02-18 DIAGNOSIS — E119 Type 2 diabetes mellitus without complications: Secondary | ICD-10-CM

## 2023-02-18 DIAGNOSIS — I10 Essential (primary) hypertension: Secondary | ICD-10-CM

## 2023-02-18 DIAGNOSIS — Z23 Encounter for immunization: Secondary | ICD-10-CM

## 2023-02-18 DIAGNOSIS — Z0000 Encounter for general adult medical examination without abnormal findings: Secondary | ICD-10-CM | POA: Diagnosis not present

## 2023-02-18 LAB — CBC WITH DIFFERENTIAL/PLATELET
Basophils Absolute: 0 10*3/uL (ref 0.0–0.1)
Basophils Relative: 0.9 % (ref 0.0–3.0)
Eosinophils Absolute: 0.2 10*3/uL (ref 0.0–0.7)
Eosinophils Relative: 3 % (ref 0.0–5.0)
HCT: 41.1 % (ref 36.0–46.0)
Hemoglobin: 13.1 g/dL (ref 12.0–15.0)
Lymphocytes Relative: 42 % (ref 12.0–46.0)
Lymphs Abs: 2.4 10*3/uL (ref 0.7–4.0)
MCHC: 31.8 g/dL (ref 30.0–36.0)
MCV: 83.9 fL (ref 78.0–100.0)
Monocytes Absolute: 0.6 10*3/uL (ref 0.1–1.0)
Monocytes Relative: 9.8 % (ref 3.0–12.0)
Neutro Abs: 2.5 10*3/uL (ref 1.4–7.7)
Neutrophils Relative %: 44.3 % (ref 43.0–77.0)
Platelets: 205 10*3/uL (ref 150.0–400.0)
RBC: 4.9 Mil/uL (ref 3.87–5.11)
RDW: 14.7 % (ref 11.5–15.5)
WBC: 5.7 10*3/uL (ref 4.0–10.5)

## 2023-02-18 LAB — LIPID PANEL
Cholesterol: 94 mg/dL (ref 0–200)
HDL: 42.8 mg/dL (ref >39.00)
LDL Cholesterol: 34 mg/dL (ref 0–99)
NonHDL: 51.34
Total CHOL/HDL Ratio: 2
Triglycerides: 85 mg/dL (ref 0.0–149.0)
VLDL: 17 mg/dL (ref 0.0–40.0)

## 2023-02-18 LAB — HEPATIC FUNCTION PANEL
ALT: 12 U/L (ref 0–35)
AST: 17 U/L (ref 0–37)
Albumin: 3.8 g/dL (ref 3.5–5.2)
Alkaline Phosphatase: 67 U/L (ref 39–117)
Bilirubin, Direct: 0.1 mg/dL (ref 0.0–0.3)
Total Bilirubin: 0.4 mg/dL (ref 0.2–1.2)
Total Protein: 7.2 g/dL (ref 6.0–8.3)

## 2023-02-18 LAB — BASIC METABOLIC PANEL
BUN: 14 mg/dL (ref 6–23)
CO2: 30 meq/L (ref 19–32)
Calcium: 9.4 mg/dL (ref 8.4–10.5)
Chloride: 108 meq/L (ref 96–112)
Creatinine, Ser: 0.97 mg/dL (ref 0.40–1.20)
GFR: 61.02 mL/min (ref >60.00)
Glucose, Bld: 78 mg/dL (ref 70–99)
Potassium: 4.5 meq/L (ref 3.5–5.1)
Sodium: 144 meq/L (ref 135–145)

## 2023-02-18 LAB — HEMOGLOBIN A1C: Hgb A1c MFr Bld: 6.3 % (ref 4.6–6.5)

## 2023-02-18 LAB — TSH: TSH: 0.99 u[IU]/mL (ref 0.35–5.50)

## 2023-02-18 LAB — VITAMIN D 25 HYDROXY (VIT D DEFICIENCY, FRACTURES): VITD: 44.34 ng/mL (ref 30.00–100.00)

## 2023-02-18 NOTE — Patient Instructions (Signed)
Follow up in 6 months to recheck sugar, BP, and cholesterol We'll notify you of your lab results and make any changes if needed Keep up the good work on healthy diet and regular exercise- you're doing great!! Check your BP at home and if consistently higher than 140/90- let me know and we can adjust medication Call with any questions or concerns Stay Safe!  Stay Healthy! Happy Thanksgiving!!
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
## 2023-02-18 NOTE — Assessment & Plan Note (Signed)
Ongoing issue for pt.  Currently UTD on microalbumin.  Foot exam done today and eye exam is scheduled.  Applauded her 10 lb weight loss.  Encouraged her to continue.  Check labs and determine if medication is needed.

## 2023-02-18 NOTE — Assessment & Plan Note (Signed)
Check labs and replete prn. 

## 2023-02-18 NOTE — Assessment & Plan Note (Signed)
Pt's PE WNL w/ exception of BMI.  UTD on mammo, colonoscopy, Tdap, PNA.  Applauded her weight loss efforts and encouraged her to continue.  Check labs.  Anticipatory guidance provided.
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
## 2023-02-19 ENCOUNTER — Other Ambulatory Visit (HOSPITAL_COMMUNITY): Payer: Self-pay

## 2023-02-19 ENCOUNTER — Other Ambulatory Visit: Payer: Self-pay | Admitting: Family Medicine

## 2023-02-19 DIAGNOSIS — H5203 Hypermetropia, bilateral: Secondary | ICD-10-CM | POA: Diagnosis not present

## 2023-02-19 DIAGNOSIS — H2513 Age-related nuclear cataract, bilateral: Secondary | ICD-10-CM | POA: Diagnosis not present

## 2023-02-19 LAB — HM DIABETES EYE EXAM

## 2023-02-19 MED ORDER — ATORVASTATIN CALCIUM 10 MG PO TABS
10.0000 mg | ORAL_TABLET | Freq: Every day | ORAL | 1 refills | Status: DC
  Filled 2023-02-19: qty 90, 90d supply, fill #0
  Filled 2023-05-19: qty 90, 90d supply, fill #1

## 2023-02-20 ENCOUNTER — Telehealth: Payer: Self-pay

## 2023-02-20 NOTE — Telephone Encounter (Signed)
-----   Message from Neena Rhymes sent at 02/20/2023  7:34 AM EST ----- Labs look great!  No changes at this time

## 2023-03-25 ENCOUNTER — Encounter: Payer: Self-pay | Admitting: Family Medicine

## 2023-03-25 NOTE — Telephone Encounter (Signed)
 Care team updated and letter sent for eye exam notes.

## 2023-04-29 ENCOUNTER — Telehealth: Payer: Self-pay | Admitting: Family Medicine

## 2023-04-29 NOTE — Telephone Encounter (Signed)
Type of form received:[employer]   Additional comments:   Received ZO:[id number] - Front Desk   Form should be Faxed/mailed to: N/A  Is patient requesting call for pickup: N/A  Form placed: [employer] charge sheet.  Provider will determine charge. N/A  Individual made aware of 3-5 business day turn around No?

## 2023-04-29 NOTE — Telephone Encounter (Signed)
Placed in your sign folder  

## 2023-05-03 NOTE — Telephone Encounter (Signed)
Reviewed. No action needed.

## 2023-05-19 ENCOUNTER — Other Ambulatory Visit: Payer: Self-pay

## 2023-05-19 ENCOUNTER — Other Ambulatory Visit: Payer: Self-pay | Admitting: Family Medicine

## 2023-05-19 DIAGNOSIS — I10 Essential (primary) hypertension: Secondary | ICD-10-CM

## 2023-05-20 ENCOUNTER — Other Ambulatory Visit (HOSPITAL_COMMUNITY): Payer: Self-pay

## 2023-05-20 ENCOUNTER — Other Ambulatory Visit (HOSPITAL_BASED_OUTPATIENT_CLINIC_OR_DEPARTMENT_OTHER): Payer: Self-pay

## 2023-05-20 MED ORDER — LOSARTAN POTASSIUM-HCTZ 100-12.5 MG PO TABS
1.0000 | ORAL_TABLET | Freq: Every day | ORAL | 1 refills | Status: DC
  Filled 2023-05-20: qty 90, 90d supply, fill #0

## 2023-05-20 MED ORDER — AMLODIPINE BESYLATE 5 MG PO TABS
5.0000 mg | ORAL_TABLET | Freq: Every day | ORAL | 1 refills | Status: DC
  Filled 2023-05-20: qty 90, 90d supply, fill #0

## 2023-05-22 ENCOUNTER — Other Ambulatory Visit (HOSPITAL_COMMUNITY): Payer: Self-pay

## 2023-05-24 ENCOUNTER — Other Ambulatory Visit: Payer: Self-pay | Admitting: Family Medicine

## 2023-05-24 DIAGNOSIS — Z1231 Encounter for screening mammogram for malignant neoplasm of breast: Secondary | ICD-10-CM

## 2023-06-28 ENCOUNTER — Ambulatory Visit
Admission: RE | Admit: 2023-06-28 | Discharge: 2023-06-28 | Disposition: A | Discharge status: Home / Self Care | Payer: Medicare HMO | Source: Ambulatory Visit | Attending: Family Medicine | Admitting: Family Medicine

## 2023-06-28 DIAGNOSIS — Z1231 Encounter for screening mammogram for malignant neoplasm of breast: Secondary | ICD-10-CM | POA: Diagnosis not present

## 2023-07-23 ENCOUNTER — Other Ambulatory Visit (HOSPITAL_COMMUNITY): Payer: Self-pay

## 2023-07-23 ENCOUNTER — Ambulatory Visit (INDEPENDENT_AMBULATORY_CARE_PROVIDER_SITE_OTHER): Admitting: Student in an Organized Health Care Education/Training Program

## 2023-07-23 ENCOUNTER — Encounter: Payer: Self-pay | Admitting: Student in an Organized Health Care Education/Training Program

## 2023-07-23 VITALS — BP 120/80 | HR 70 | Temp 98.1°F | Wt 255.0 lb

## 2023-07-23 DIAGNOSIS — J0101 Acute recurrent maxillary sinusitis: Secondary | ICD-10-CM

## 2023-07-23 MED ORDER — AMOXICILLIN-POT CLAVULANATE 875-125 MG PO TABS
1.0000 | ORAL_TABLET | Freq: Two times a day (BID) | ORAL | 0 refills | Status: AC
  Filled 2023-07-23: qty 14, 7d supply, fill #0

## 2023-07-23 MED ORDER — FLUTICASONE PROPIONATE 50 MCG/ACT NA SUSP
2.0000 | Freq: Every day | NASAL | 6 refills | Status: AC
  Filled 2023-07-23: qty 16, 30d supply, fill #0

## 2023-07-23 NOTE — Progress Notes (Signed)
   Acute Office Visit  Subjective:     Patient ID: Ashlee Williams, female    DOB: 11/22/1955, 68 y.o.   MRN: [id number]  Chief Complaint  Patient presents with   Cough    Patient states went on a family trip and came back with congestion,coughing, very horse. Left on march 31st then returned home on April the 7th.  Patient states she has been taking dayquil and robitussin. No fevers that patient is aware of.     HPI  Patient is in today for 1 week of congestion and ear fullness.  She was on a cruise to the [place] and returned on 4/7.  Since that time she has had increased sinus pressure, nasal congestion, and ear fullness.  Denies any discomfort in the ears.  No fevers or chills.  She is having a cough with some small productive sputum.  She has a history of asthma but denies any shortness of breath or chest tightness.  No fevers, eating and drinking well, no nausea or vomiting, getting around the house okay.      Objective:    BP 120/80   Pulse 70   Temp 98.1 F (36.7 C) (Temporal)   Wt 255 lb (115.7 kg)   LMP 11/10/2010   SpO2 100%   BMI 45.17 kg/m    Physical Exam  Gen: Well-appearing woman, no distress Ears: Bilateral tympanic membranes are mildly erythematous, reduced light reflex, very small effusions bilaterally Mouth: Mild erythema of the posterior oropharynx Heart: Regular, no murmur Lungs: Unlabored, clear throughout, no crackles or wheezing      Assessment & Plan:   Problem List Items Addressed This Visit       Unprioritized   Acute sinusitis - Primary   Clinical course and exam today are most consistent with acute sinusitis.  Also looks like she has bilateral effusions in the ears, no overt signs of otitis media but may progress there.  We talked about supportive care including sinus irrigation and intranasal steroid.  No signs of asthma exacerbation or pneumonia on exam today.  We talked about the limited role of antibiotics, set low expectations  for those.  We talked about risk of side effects with antibiotics.  Will continue with supportive care, Augmentin, nasal irrigation, and intranasal steroids.  Very likely to improve over the coming 2-4 days.      Relevant Medications   fluticasone (FLONASE) 50 MCG/ACT nasal spray   amoxicillin-clavulanate (AUGMENTIN) 875-125 MG tablet    Meds ordered this encounter  Medications   fluticasone (FLONASE) 50 MCG/ACT nasal spray    Sig: Place 2 sprays into both nostrils daily.    Dispense:  16 g    Refill:  6   amoxicillin-clavulanate (AUGMENTIN) 875-125 MG tablet    Sig: Take 1 tablet by mouth 2 (two) times daily for 7 days.    Dispense:  14 tablet    Refill:  0    No follow-ups on file.  Ether Hercules, MD

## 2023-07-23 NOTE — Patient Instructions (Signed)
NASAL STEROID USE INSTRUCTIONS  Step 1. Prepare the nose. Blow the nose before administering the drug.  Step 2. Prime and activate the delivery device as recommended by the manufacturer.  Step 3. Position the head by tilting the head forward.  Step 4. Insert the tip of the applicator gently, avoiding contact with the septum.  Step 5. Aim the applicator tip about 45 from the floor of the nose and direct it at the outer corner of the eye on the same side to avoid traumatizing or spraying the septum.  Step 6. Close the other nostril gently with a finger.   Step 7. Sniff or inhale gently while delivering the drug.   

## 2023-07-23 NOTE — Assessment & Plan Note (Signed)
 Clinical course and exam today are most consistent with acute sinusitis.  Also looks like she has bilateral effusions in the ears, no overt signs of otitis media but may progress there.  We talked about supportive care including sinus irrigation and intranasal steroid.  No signs of asthma exacerbation or pneumonia on exam today.  We talked about the limited role of antibiotics, set low expectations for those.  We talked about risk of side effects with antibiotics.  Will continue with supportive care, Augmentin, nasal irrigation, and intranasal steroids.  Very likely to improve over the coming 2-4 days.

## 2023-08-19 ENCOUNTER — Other Ambulatory Visit (HOSPITAL_COMMUNITY): Payer: Self-pay

## 2023-08-19 ENCOUNTER — Ambulatory Visit (INDEPENDENT_AMBULATORY_CARE_PROVIDER_SITE_OTHER): Payer: Medicare HMO | Admitting: Family Medicine

## 2023-08-19 ENCOUNTER — Encounter: Payer: Self-pay | Admitting: Family Medicine

## 2023-08-19 VITALS — BP 120/74 | HR 67 | Temp 98.0°F | Ht 63.0 in | Wt 255.4 lb

## 2023-08-19 DIAGNOSIS — H6992 Unspecified Eustachian tube disorder, left ear: Secondary | ICD-10-CM

## 2023-08-19 DIAGNOSIS — E119 Type 2 diabetes mellitus without complications: Secondary | ICD-10-CM

## 2023-08-19 DIAGNOSIS — E785 Hyperlipidemia, unspecified: Secondary | ICD-10-CM

## 2023-08-19 DIAGNOSIS — Z6841 Body Mass Index (BMI) 40.0 and over, adult: Secondary | ICD-10-CM

## 2023-08-19 DIAGNOSIS — E1169 Type 2 diabetes mellitus with other specified complication: Secondary | ICD-10-CM | POA: Diagnosis not present

## 2023-08-19 DIAGNOSIS — I10 Essential (primary) hypertension: Secondary | ICD-10-CM | POA: Diagnosis not present

## 2023-08-19 LAB — CBC WITH DIFFERENTIAL/PLATELET
Basophils Absolute: 0.1 10*3/uL (ref 0.0–0.1)
Basophils Relative: 1.2 % (ref 0.0–3.0)
Eosinophils Absolute: 0.1 10*3/uL (ref 0.0–0.7)
Eosinophils Relative: 2.3 % (ref 0.0–5.0)
HCT: 39.5 % (ref 36.0–46.0)
Hemoglobin: 12.7 g/dL (ref 12.0–15.0)
Lymphocytes Relative: 42.4 % (ref 12.0–46.0)
Lymphs Abs: 2.4 10*3/uL (ref 0.7–4.0)
MCHC: 32.1 g/dL (ref 30.0–36.0)
MCV: 83.8 fl (ref 78.0–100.0)
Monocytes Absolute: 0.5 10*3/uL (ref 0.1–1.0)
Monocytes Relative: 8.8 % (ref 3.0–12.0)
Neutro Abs: 2.6 10*3/uL (ref 1.4–7.7)
Neutrophils Relative %: 45.3 % (ref 43.0–77.0)
Platelets: 221 10*3/uL (ref 150.0–400.0)
RBC: 4.71 Mil/uL (ref 3.87–5.11)
RDW: 15 % (ref 11.5–15.5)
WBC: 5.8 10*3/uL (ref 4.0–10.5)

## 2023-08-19 LAB — HEPATIC FUNCTION PANEL
ALT: 16 U/L (ref 0–35)
AST: 15 U/L (ref 0–37)
Albumin: 3.9 g/dL (ref 3.5–5.2)
Alkaline Phosphatase: 62 U/L (ref 39–117)
Bilirubin, Direct: 0.1 mg/dL (ref 0.0–0.3)
Total Bilirubin: 0.4 mg/dL (ref 0.2–1.2)
Total Protein: 7.3 g/dL (ref 6.0–8.3)

## 2023-08-19 LAB — HEMOGLOBIN A1C: Hgb A1c MFr Bld: 6.4 % (ref 4.6–6.5)

## 2023-08-19 LAB — MICROALBUMIN / CREATININE URINE RATIO
Creatinine,U: 118.2 mg/dL
Microalb Creat Ratio: UNDETERMINED mg/g (ref 0.0–30.0)
Microalb, Ur: <0.7 mg/dL

## 2023-08-19 LAB — BASIC METABOLIC PANEL WITH GFR
BUN: 17 mg/dL (ref 6–23)
CO2: 29 meq/L (ref 19–32)
Calcium: 9.5 mg/dL (ref 8.4–10.5)
Chloride: 103 meq/L (ref 96–112)
Creatinine, Ser: 0.93 mg/dL (ref 0.40–1.20)
GFR: 63.96 mL/min (ref >60.00)
Glucose, Bld: 80 mg/dL (ref 70–99)
Potassium: 4.1 meq/L (ref 3.5–5.1)
Sodium: 140 meq/L (ref 135–145)

## 2023-08-19 LAB — LIPID PANEL
Cholesterol: 90 mg/dL (ref 0–200)
HDL: 44.3 mg/dL (ref >39.00)
LDL Cholesterol: 33 mg/dL (ref 0–99)
NonHDL: 46.06
Total CHOL/HDL Ratio: 2
Triglycerides: 63 mg/dL (ref 0.0–149.0)
VLDL: 12.6 mg/dL (ref 0.0–40.0)

## 2023-08-19 LAB — TSH: TSH: 0.91 u[IU]/mL (ref 0.35–5.50)

## 2023-08-19 MED ORDER — LOSARTAN POTASSIUM-HCTZ 100-12.5 MG PO TABS
1.0000 | ORAL_TABLET | Freq: Every day | ORAL | 1 refills | Status: DC
Start: 2023-08-19 — End: 2024-02-19
  Filled 2023-08-19: qty 90, 90d supply, fill #0
  Filled 2023-11-18: qty 90, 90d supply, fill #1

## 2023-08-19 MED ORDER — AMLODIPINE BESYLATE 5 MG PO TABS
5.0000 mg | ORAL_TABLET | Freq: Every day | ORAL | 1 refills | Status: DC
  Filled 2023-08-19: qty 90, 90d supply, fill #0
  Filled 2023-11-18: qty 90, 90d supply, fill #1

## 2023-08-19 MED ORDER — ATORVASTATIN CALCIUM 10 MG PO TABS
10.0000 mg | ORAL_TABLET | Freq: Every day | ORAL | 1 refills | Status: DC
  Filled 2023-08-19: qty 90, 90d supply, fill #0
  Filled 2023-11-18: qty 90, 90d supply, fill #1

## 2023-08-19 NOTE — Assessment & Plan Note (Signed)
 Chronic problem.  Pt has been able to control w/o medication.  Currently asymptomatic.  UTD on eye exam, foot exam.  Microalbumin ordered.  Check labs.

## 2023-08-19 NOTE — Patient Instructions (Signed)
 Schedule your complete physical in 6 months We'll notify you of your lab results and make any changes if needed CONTINUE the Flonase  daily ADD OTC Phenylephrine (decongestant) for the next 5-7 days to help w/ ear pressure Drink LOTS of fluids Continue to work on healthy diet and regular exercise- you can do it! Call with any questions or concerns Stay Safe!  Stay Healthy! HAVE  A GREAT SUMMER!!!

## 2023-08-19 NOTE — Assessment & Plan Note (Signed)
 Chronic problem.  On Lipitor 10mg  daily w/o difficulty.  Check labs.  Adjust meds prn

## 2023-08-19 NOTE — Assessment & Plan Note (Signed)
 Ongoing issue for pt.  BMI 45.24.  Attempting to be more physically active and eat healthy.  Will continue to follow.

## 2023-08-19 NOTE — Progress Notes (Signed)
   Subjective:    Patient ID: Ashlee Williams, female    DOB: 18-Apr-1955, 68 y.o.   MRN: [id number]  HPI DM- chronic problem.  Currently diet controlled.  UTD on eye exam, foot exam.  Due for microalbumin.  No numbness/tingling of hands/feet.  HTN- chronic problem, on Amlodipine  5mg  daily, Losartan  hydrochlorothiazide  100/12.5mg  daily w/ good control.  No CP, SOB, HA's, visual changes.  Hyperlipidemia- chronic problem, on Lipitor 10mg  daily.  No abd pain, N/V.  Obesity- ongoing issue.  BMI 45.24.  attempting to be more physically active.    L ear pain- was dx'd w/ bacterial sinus and ear infxns on 4/15.  Tx'd w/ Augmentin  and Flonase .  Continues to have L ear pain.  No fever.  No drainage from ear.   Review of Systems For ROS see HPI     Objective:   Physical Exam Vitals reviewed.  Constitutional:      General: She is not in acute distress.    Appearance: Normal appearance. She is well-developed. She is obese. She is not ill-appearing.  HENT:     Head: Normocephalic and atraumatic.     Right Ear: Tympanic membrane and ear canal normal.     Left Ear: Tympanic membrane is retracted.  Eyes:     Conjunctiva/sclera: Conjunctivae normal.     Pupils: Pupils are equal, round, and reactive to light.  Neck:     Thyroid : No thyromegaly.  Cardiovascular:     Rate and Rhythm: Normal rate and regular rhythm.     Heart sounds: Normal heart sounds. No murmur heard. Pulmonary:     Effort: Pulmonary effort is normal. No respiratory distress.     Breath sounds: Normal breath sounds.  Abdominal:     General: There is no distension.     Palpations: Abdomen is soft.     Tenderness: There is no abdominal tenderness.  Musculoskeletal:     Cervical back: Normal range of motion and neck supple.  Lymphadenopathy:     Cervical: No cervical adenopathy.  Skin:    General: Skin is warm and dry.  Neurological:     Mental Status: She is alert and oriented to person, place, and time.   Psychiatric:        Behavior: Behavior normal.           Assessment & Plan:  Eustachian tube dysfxn- new.  No evidence of remaining infxn but TM retracted.  Start OTC decongestant.  Pt expressed understanding and is in agreement w/ plan.

## 2023-08-19 NOTE — Assessment & Plan Note (Signed)
 Chronic problem.  Well controlled on Amlodipine , Losartan , and hydrochlorothiazide .  Currently asymptomatic.  Check labs due to ARB and diuretic use but no anticipated med changes.  Will follow.

## 2023-08-20 ENCOUNTER — Ambulatory Visit: Payer: Self-pay | Admitting: Family Medicine

## 2023-08-20 NOTE — Telephone Encounter (Signed)
 Copied from CRM [id number]. Topic: Clinical - Lab/Test Results >> Aug 20, 2023 11:13 AM [place] J wrote: Reason for CRM: Patient calling in for lab results. / Results were relayed and patient has no further questions at this time.

## 2023-08-20 NOTE — Telephone Encounter (Signed)
-----   Message from Laymon Priest sent at 08/20/2023  8:46 AM EDT ----- Labs look great!  No changes at this time

## 2023-11-26 ENCOUNTER — Ambulatory Visit (INDEPENDENT_AMBULATORY_CARE_PROVIDER_SITE_OTHER): Admitting: Family Medicine

## 2023-11-26 ENCOUNTER — Other Ambulatory Visit (HOSPITAL_COMMUNITY): Payer: Self-pay

## 2023-11-26 ENCOUNTER — Encounter: Payer: Self-pay | Admitting: Family Medicine

## 2023-11-26 VITALS — BP 134/72 | HR 69 | Temp 98.2°F | Ht 63.0 in | Wt 254.5 lb

## 2023-11-26 DIAGNOSIS — U071 COVID-19: Secondary | ICD-10-CM

## 2023-11-26 MED ORDER — PROMETHAZINE-DM 6.25-15 MG/5ML PO SYRP
5.0000 mL | ORAL_SOLUTION | Freq: Four times a day (QID) | ORAL | 0 refills | Status: AC | PRN
  Filled 2023-11-26: qty 180, 9d supply, fill #0

## 2023-11-26 NOTE — Progress Notes (Signed)
   Subjective:    Patient ID: Ashlee Williams, female    DOB: Sep 19, 1955, 68 y.o.   MRN: [id number]  HPI COVID- sxs started 1 week ago w/ cough.  + body aches.  No fever.  + congestion, HA.  Attended a large conference the week before.  Did a home test 4 days ago that was +.  Continues to have fatigue and congestion.  Drinking lots of fluids.  Resting when able.   Review of Systems For ROS see HPI     Objective:   Physical Exam Vitals reviewed.  Constitutional:      General: She is not in acute distress.    Appearance: Normal appearance. She is not ill-appearing.  HENT:     Head: Normocephalic and atraumatic.     Right Ear: Tympanic membrane and ear canal normal.     Left Ear: Tympanic membrane and ear canal normal.     Nose: Congestion present.     Mouth/Throat:     Mouth: Mucous membranes are moist.     Pharynx: Oropharynx is clear. No oropharyngeal exudate or posterior oropharyngeal erythema.  Eyes:     Extraocular Movements: Extraocular movements intact.     Conjunctiva/sclera: Conjunctivae normal.  Cardiovascular:     Rate and Rhythm: Normal rate and regular rhythm.  Pulmonary:     Effort: Pulmonary effort is normal. No respiratory distress.     Breath sounds: No wheezing or rhonchi.     Comments: Dry hacking cough Musculoskeletal:     Cervical back: Neck supple. No rigidity.  Lymphadenopathy:     Cervical: No cervical adenopathy.  Skin:    General: Skin is warm and dry.  Neurological:     General: No focal deficit present.     Mental Status: She is alert and oriented to person, place, and time.  Psychiatric:        Mood and Affect: Mood normal.        Behavior: Behavior normal.        Thought Content: Thought content normal.           Assessment & Plan:  COVID- she is outside the window for Paxlovid tx.  Sxs are improving w/ exception of dry, hacking cough.  Will start cough syrup prn.  Reviewed supportive care and red flags that should prompt  return.  Pt expressed understanding and is in agreement w/ plan.

## 2023-11-26 NOTE — Patient Instructions (Signed)
 Follow up as needed or as scheduled USE the cough syrup as needed Continue to drink LOTS of fluids REST as needed Call with any questions or concerns Hang in there!!!

## 2024-02-19 ENCOUNTER — Other Ambulatory Visit: Payer: Self-pay | Admitting: Family Medicine

## 2024-02-19 DIAGNOSIS — I10 Essential (primary) hypertension: Secondary | ICD-10-CM

## 2024-02-19 LAB — OPHTHALMOLOGY REPORT-SCANNED

## 2024-02-20 ENCOUNTER — Other Ambulatory Visit (HOSPITAL_COMMUNITY): Payer: Self-pay

## 2024-02-20 DIAGNOSIS — H2513 Age-related nuclear cataract, bilateral: Secondary | ICD-10-CM | POA: Diagnosis not present

## 2024-02-20 DIAGNOSIS — H5203 Hypermetropia, bilateral: Secondary | ICD-10-CM | POA: Diagnosis not present

## 2024-02-20 DIAGNOSIS — E119 Type 2 diabetes mellitus without complications: Secondary | ICD-10-CM | POA: Diagnosis not present

## 2024-02-20 MED ORDER — AMLODIPINE BESYLATE 5 MG PO TABS
5.0000 mg | ORAL_TABLET | Freq: Every day | ORAL | 1 refills | Status: AC
  Filled 2024-02-20: qty 90, 90d supply, fill #0

## 2024-02-20 MED ORDER — LOSARTAN POTASSIUM-HCTZ 100-12.5 MG PO TABS
1.0000 | ORAL_TABLET | Freq: Every day | ORAL | 1 refills | Status: AC
  Filled 2024-02-20: qty 90, 90d supply, fill #0

## 2024-02-20 MED ORDER — ATORVASTATIN CALCIUM 10 MG PO TABS
10.0000 mg | ORAL_TABLET | Freq: Every day | ORAL | 1 refills | Status: AC
  Filled 2024-02-20: qty 90, 90d supply, fill #0

## 2024-02-27 ENCOUNTER — Ambulatory Visit: Admitting: Family Medicine

## 2024-02-27 ENCOUNTER — Encounter: Payer: Self-pay | Admitting: Family Medicine

## 2024-02-27 VITALS — BP 124/80 | HR 78 | Temp 98.0°F | Ht 63.0 in | Wt 259.0 lb

## 2024-02-27 DIAGNOSIS — E119 Type 2 diabetes mellitus without complications: Secondary | ICD-10-CM | POA: Diagnosis not present

## 2024-02-27 DIAGNOSIS — Z0000 Encounter for general adult medical examination without abnormal findings: Secondary | ICD-10-CM

## 2024-02-27 DIAGNOSIS — E559 Vitamin D deficiency, unspecified: Secondary | ICD-10-CM | POA: Diagnosis not present

## 2024-02-27 LAB — CBC WITH DIFFERENTIAL/PLATELET
Basophils Absolute: 0 10*3/uL (ref 0.0–0.1)
Basophils Relative: 0.2 % (ref 0.0–3.0)
Eosinophils Absolute: 0.1 10*3/uL (ref 0.0–0.7)
Eosinophils Relative: 1.7 % (ref 0.0–5.0)
HCT: 39.2 % (ref 36.0–46.0)
Hemoglobin: 12.5 g/dL (ref 12.0–15.0)
Lymphocytes Relative: 38.8 % (ref 12.0–46.0)
Lymphs Abs: 2 10*3/uL (ref 0.7–4.0)
MCHC: 32 g/dL (ref 30.0–36.0)
MCV: 82.5 fl (ref 78.0–100.0)
Monocytes Absolute: 0.4 10*3/uL (ref 0.1–1.0)
Monocytes Relative: 8.5 % (ref 3.0–12.0)
Neutro Abs: 2.6 10*3/uL (ref 1.4–7.7)
Neutrophils Relative %: 50.8 % (ref 43.0–77.0)
Platelets: 216 10*3/uL (ref 150.0–400.0)
RBC: 4.75 Mil/uL (ref 3.87–5.11)
RDW: 16 % — ABNORMAL HIGH (ref 11.5–15.5)
WBC: 5.1 10*3/uL (ref 4.0–10.5)

## 2024-02-27 LAB — VITAMIN D 25 HYDROXY (VIT D DEFICIENCY, FRACTURES): VITD: 49.98 ng/mL (ref 30.00–100.00)

## 2024-02-27 LAB — HEPATIC FUNCTION PANEL
ALT: 15 U/L (ref 0–35)
AST: 14 U/L (ref 0–37)
Albumin: 3.8 g/dL (ref 3.5–5.2)
Alkaline Phosphatase: 64 U/L (ref 39–117)
Bilirubin, Direct: 0.1 mg/dL (ref 0.0–0.3)
Total Bilirubin: 0.3 mg/dL (ref 0.2–1.2)
Total Protein: 7.3 g/dL (ref 6.0–8.3)

## 2024-02-27 LAB — TSH: TSH: 1.43 u[IU]/mL (ref 0.35–5.50)

## 2024-02-27 LAB — BASIC METABOLIC PANEL WITH GFR
BUN: 9 mg/dL (ref 6–23)
CO2: 30 meq/L (ref 19–32)
Calcium: 9.2 mg/dL (ref 8.4–10.5)
Chloride: 106 meq/L (ref 96–112)
Creatinine, Ser: 0.85 mg/dL (ref 0.40–1.20)
GFR: 70.99 mL/min (ref >60.00)
Glucose, Bld: 86 mg/dL (ref 70–99)
Potassium: 4 meq/L (ref 3.5–5.1)
Sodium: 142 meq/L (ref 135–145)

## 2024-02-27 LAB — LIPID PANEL
Cholesterol: 94 mg/dL (ref 0–200)
HDL: 49.2 mg/dL
LDL Cholesterol: 31 mg/dL (ref 0–99)
NonHDL: 44.9
Total CHOL/HDL Ratio: 2
Triglycerides: 68 mg/dL (ref 0.0–149.0)
VLDL: 13.6 mg/dL (ref 0.0–40.0)

## 2024-02-27 LAB — HEMOGLOBIN A1C: Hgb A1c MFr Bld: 6.3 % (ref 4.6–6.5)

## 2024-02-27 NOTE — Assessment & Plan Note (Signed)
 Pt's PE WNL w/ exception of BMI.  UTD on colonoscopy, mammo, eye exam, microalbumin, foot exam, Tdap, PNA.  Declines flu shot today.  Check labs.  Anticipatory guidance provided.

## 2024-02-27 NOTE — Progress Notes (Signed)
   Subjective:    Patient ID: Ashlee Williams, female    DOB: 04/13/1955, 68 y.o.   MRN: [id number]  HPI CPE- UTD on colonoscopy, mammo, eye exam, foot exam, microalbumin, Tdap, PNA.  Declines flu  Patient Care Team    Relationship Specialty Notifications Start End  Mahlon Comer BRAVO, MD PCP - General Family Medicine  02/05/11    Comment: Merged (Merged)  Geraldene Loving, MD Consulting Physician Ophthalmology  09/05/17   Early, Krystal FALCON, MD (Inactive) Consulting Physician Vascular Surgery  09/05/17   Neysa Reggy BIRCH, MD Consulting Physician Pulmonary Disease  09/05/17   Avram Lupita BRAVO, MD Consulting Physician Gastroenterology  09/05/17      Health Maintenance  Topic Date Due   Medicare Annual Wellness (AWV)  Never done   Zoster Vaccines- Shingrix  (2 of 2) 10/11/2022   Influenza Vaccine  11/08/2023   HEMOGLOBIN A1C  02/19/2024   Colonoscopy  04/14/2024   Mammogram  06/27/2024   Diabetic kidney evaluation - eGFR measurement  08/18/2024   Diabetic kidney evaluation - Urine ACR  08/18/2024   OPHTHALMOLOGY EXAM  02/18/2025   FOOT EXAM  02/26/2025   DTaP/Tdap/Td (3 - Td or Tdap) 10/27/2030   Pneumococcal Vaccine: 50+ Years  Completed   Bone Density Scan  Completed   Hepatitis C Screening  Completed   Meningococcal B Vaccine  Aged Out   COVID-19 Vaccine  Discontinued      Review of Systems Patient reports no vision/ hearing changes, adenopathy,fever, weight change,  persistant/recurrent hoarseness , swallowing issues, chest pain, palpitations, edema, persistant/recurrent cough, hemoptysis, dyspnea (rest/exertional/paroxysmal nocturnal), gastrointestinal bleeding (melena, rectal bleeding), abdominal pain, significant heartburn, bowel changes, GU symptoms (dysuria, hematuria, incontinence), Gyn symptoms (abnormal  bleeding, pain),  syncope, focal weakness, memory loss, numbness & tingling, skin/hair/nail changes, abnormal bruising or bleeding, anxiety, or depression.      Objective:   Physical Exam General Appearance:    Alert, cooperative, no distress, appears stated age, obese  Head:    Normocephalic, without obvious abnormality, atraumatic  Eyes:    PERRL, conjunctiva/corneas clear, EOM's intact, fundi    benign, both eyes  Ears:    Normal TM's and external ear canals, both ears  Nose:   Nares normal, septum midline, mucosa normal, no drainage    or sinus tenderness  Throat:   Lips, mucosa, and tongue normal; teeth and gums normal  Neck:   Supple, symmetrical, trachea midline, no adenopathy;    Thyroid : no enlargement/tenderness/nodules  Back:     Symmetric, no curvature, ROM normal, no CVA tenderness  Lungs:     Clear to auscultation bilaterally, respirations unlabored  Chest Wall:    No tenderness or deformity   Heart:    Regular rate and rhythm, S1 and S2 normal, no murmur, rub   or gallop  Breast Exam:    Deferred to GYN  Abdomen:     Soft, non-tender, bowel sounds active all four quadrants,    no masses, no organomegaly  Genitalia:    Deferred to GYN  Rectal:    Extremities:   Extremities normal, atraumatic, no cyanosis or edema  Pulses:   2+ and symmetric all extremities  Skin:   Skin color, texture, turgor normal, no rashes or lesions  Lymph nodes:   Cervical, supraclavicular, and axillary nodes normal  Neurologic:   CNII-XII intact, normal strength, sensation and reflexes    throughout          Assessment & Plan:

## 2024-02-27 NOTE — Patient Instructions (Signed)
 Follow up in 6 months to recheck sugar, blood pressure, cholesterol We'll notify you of your lab results and make any changes if needed Continue to work on healthy diet and regular exercise- you can do it! Call with any questions or concerns Stay Safe!  Stay Healthy! Happy Holidays!!!

## 2024-02-28 ENCOUNTER — Ambulatory Visit: Payer: Self-pay | Admitting: Family Medicine

## 2024-03-12 ENCOUNTER — Encounter: Payer: Self-pay | Admitting: Physician Assistant

## 2024-03-19 ENCOUNTER — Encounter: Admitting: Family Medicine

## 2024-03-19 ENCOUNTER — Ambulatory Visit

## 2024-04-20 NOTE — Progress Notes (Unsigned)
 "  Chief Complaint: Discuss colonoscopy  HPI:    Ashlee Williams is a 69 year old African-American female, known to Dr. Avram, with a past medical history as listed below including diabetes, who was referred to me by Mahlon Comer BRAVO, MD for consideration of a colonoscopy.    04/14/2014 colonoscopy with good prep and completely normal.  Repeat recommended in 10 years.    12/23/2016 echocardiogram with LVEF 55-60%.    02/27/2024 BMP, hemoglobin A1c, lipid panel, TSH, hepatic function panel and vitamin D  all normal.  CBC with RDW minimally increased at 16 otherwise normal.  Past Medical History:  Diagnosis Date   AAA (abdominal aortic aneurysm)    Allergy    Anemia    Asthma    Blood in stool    Bronchitis    Chicken pox    Chronic headaches    Diabetes mellitus    PT. IS TRYING TO CONTROL WITH DIET BUT AS NOTED WEIGHT IS UP   Hypertension    Obesity    Syncope and collapse 12/22/2016   Tobacco abuse     Past Surgical History:  Procedure Laterality Date   ABDOMINAL HYSTERECTOMY  04/09/1996   BREAST BIOPSY Right 12/07/2019   axilla/ neg   BREAST BIOPSY Right 12/07/2019   neg   CHOLECYSTECTOMY N/A 03/16/2015   Procedure: LAPAROSCOPIC CHOLECYSTECTOMY;  Surgeon: Lynwood Pina, MD;  Location: [facility] OR;  Service: General;  Laterality: N/A;   FLEXOR TENDON REPAIR Left 12/04/2021   Procedure: LEFT MIDDLE FINGER FLEXOR DIGITORUM PROFUNDUS TENDON REPAIR;  Surgeon: Romona Harari, MD;  Location: [facility];  Service: Orthopedics;  Laterality: Left;    Current Outpatient Medications  Medication Sig Dispense Refill   albuterol  (VENTOLIN  HFA) 108 (90 Base) MCG/ACT inhaler Inhale 2 puffs into the lungs every 4 (four) hours as needed for wheezing or shortness of breath. 6.7 g 6   amLODipine  (NORVASC ) 5 MG tablet Take 1 tablet (5 mg total) by mouth daily. 90 tablet 1   APAP-Isometheptene-Dichloral (MIDRIN) 325-65-100 MG CAPS Take 1 capsule by mouth every 6 (six) hours as needed for  headache.     atorvastatin  (LIPITOR) 10 MG tablet Take 1 tablet (10 mg total) by mouth daily. 90 tablet 1   calcium -vitamin D  (OSCAL WITH D) 500-200 MG-UNIT tablet Take 1 tablet by mouth.     cetirizine (ZYRTEC) 10 MG tablet Take 10 mg by mouth daily.     cyclobenzaprine  (FLEXERIL ) 5 MG tablet Take 1 tablet (5 mg total) by mouth at bedtime. 10 tablet 1   fluticasone  (FLONASE ) 50 MCG/ACT nasal spray Place 2 sprays into both nostrils daily. 16 g 6   Hyprom-Naphaz-Polysorb-Zn Sulf (CLEAR EYES COMPLETE) SOLN Place 1 drop into both eyes daily as needed (dry eyes).     ipratropium (ATROVENT ) 0.06 % nasal spray Place 2 sprays into both nostrils 4 (four) times daily. 15 mL 0   Lifitegrast  (XIIDRA ) 5 % SOLN Place 1 drop into both eyes 2 times a day 60 each PRN   losartan -hydrochlorothiazide  (HYZAAR ) 100-12.5 MG tablet Take 1 tablet by mouth daily. 90 tablet 1   methocarbamol  (ROBAXIN ) 500 MG tablet Take 1 tablet (500 mg total) by mouth every 8 (eight) hours as needed. 30 tablet 0   Multiple Vitamins-Minerals (MULTIVITAMIN WITH MINERALS) tablet Take 1 tablet by mouth daily.     ondansetron  (ZOFRAN -ODT) 4 MG disintegrating tablet Dissolve 1-2 tablets (4-8 mg total) by mouth every 8 (eight) hours as needed. 30 tablet 3   promethazine -dextromethorphan (PROMETHAZINE -DM)  6.25-15 MG/5ML syrup Take 5 mLs by mouth 4 (four) times daily as needed. 180 mL 0   rizatriptan  (MAXALT -MLT) 10 MG disintegrating tablet Dissolve 1 tablet (10 mg total) by mouth as needed for migraine. May repeat in 2 hours if needed 9 tablet 11   topiramate  (TOPAMAX ) 25 MG tablet Start with one tablet by mouth at bedtime. In 2 weeks may increase to 2 tablets at bedtime (50mg ) 60 tablet 3   zolpidem  (AMBIEN ) 5 MG tablet Take 1 tablet (5 mg total) by mouth at bedtime as needed for sleep. 30 tablet 1   No current facility-administered medications for this visit.    Allergies as of 04/21/2024   (No Known Allergies)    Family History  Problem  Relation Age of Onset   Hypertension Mother    Stroke Mother    Hypertension Father    Diabetes Sister    Hypertension Sister    Cancer Sister    Diabetes Sister    Hypertension Brother    Cancer Maternal Aunt    Hypertension Maternal Aunt    Hypertension Maternal Uncle    Hypertension Paternal Aunt    Hypertension Paternal Uncle    Stroke Maternal Grandmother    Hypertension Maternal Grandmother    Colon cancer Maternal Grandmother 53   Cancer Maternal Grandfather    Hypertension Maternal Grandfather    Hypertension Paternal Grandmother    Hypertension Paternal Grandfather     Social History   Socioeconomic History   Marital status: Single    Spouse name: Not on file   Number of children: Not on file   Years of education: Not on file   Highest education level: Bachelor's degree (e.g., BA, AB, BS)  Occupational History   Not on file  Tobacco Use   Smoking status: Former    Current packs/day: 0.00    Average packs/day: 0.5 packs/day for 10.0 years (5.0 ttl pk-yrs)    Types: Cigarettes    Start date: 07/28/2010    Quit date: 07/27/2020    Years since quitting: 3.7   Smokeless tobacco: Never  Vaping Use   Vaping status: Never Used  Substance and Sexual Activity   Alcohol use: Yes    Comment: occ   Drug use: No   Sexual activity: Yes  Other Topics Concern   Not on file  Social History Narrative   Coffee every now and then,  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  Education : Teaching Laboratory Technician.  Family single.    Social Drivers of Health   Tobacco Use: Medium Risk (02/27/2024)   Patient History    Smoking Tobacco Use: Former    Smokeless Tobacco Use: Never    Passive Exposure: Not on file  Financial Resource Strain: Medium Risk (03/17/2024)   Overall Financial Resource Strain (CARDIA)    Difficulty of Paying Living Expenses: Somewhat hard  Food Insecurity: No Food Insecurity (03/17/2024)   Epic    Worried About Programme Researcher, Broadcasting/film/video in the Last Year: Never true     Ran Out of Food in the Last Year: Never true  Transportation Needs: No Transportation Needs (03/17/2024)   Epic    Lack of Transportation (Medical): No    Lack of Transportation (Non-Medical): No  Physical Activity: Inactive (03/17/2024)   Exercise Vital Sign    Days of Exercise per Week: 0 days    Minutes of Exercise per Session: Not on file  Stress: No Stress Concern Present (03/17/2024)   [employer] of Occupational  Health - Occupational Stress Questionnaire    Feeling of Stress: Not at all  Social Connections: Moderately Integrated (03/17/2024)   Social Connection and Isolation Panel    Frequency of Communication with Friends and Family: Three times a week    Frequency of Social Gatherings with Friends and Family: More than three times a week    Attends Religious Services: More than 4 times per year    Active Member of [employer] or Organizations: Yes    Attends Engineer, Structural: More than 4 times per year    Marital Status: Divorced  Intimate Partner Violence: Not At Risk (12/01/2022)   Humiliation, Afraid, Rape, and Kick questionnaire    Fear of Current or Ex-Partner: No    Emotionally Abused: No    Physically Abused: No    Sexually Abused: No  Depression (PHQ2-9): Low Risk (02/27/2024)   Depression (PHQ2-9)    PHQ-2 Score: 0  Alcohol Screen: Low Risk (03/17/2024)   Alcohol Screen    Last Alcohol Screening Score (AUDIT): 1  Housing: High Risk (03/17/2024)   Epic    Unable to Pay for Housing in the Last Year: Yes    Number of Times Moved in the Last Year: 0    Homeless in the Last Year: No  Utilities: Not At Risk (12/01/2022)   AHC Utilities    Threatened with loss of utilities: No  Health Literacy: Not on file    Review of Systems:    Constitutional: No weight loss, fever, chills, weakness or fatigue HEENT: Eyes: No change in vision               Ears, Nose, Throat:  No change in hearing or congestion Skin: No rash or itching Cardiovascular: No chest pain,  chest pressure or palpitations   Respiratory: No SOB or cough Gastrointestinal: See HPI and otherwise negative Genitourinary: No dysuria or change in urinary frequency Neurological: No headache, dizziness or syncope Musculoskeletal: No new muscle or joint pain Hematologic: No bleeding or bruising Psychiatric: No history of depression or anxiety    Physical Exam:  Vital signs: LMP 11/10/2010   Constitutional:   Pleasant Caucasian female appears to be in NAD, Well developed, Well nourished, alert and cooperative Head:  Normocephalic and atraumatic. Eyes:   PEERL, EOMI. No icterus. Conjunctiva pink. Ears:  Normal auditory acuity. Neck:  Supple Throat: Oral cavity and pharynx without inflammation, swelling or lesion.  Respiratory: Respirations even and unlabored. Lungs clear to auscultation bilaterally.   No wheezes, crackles, or rhonchi.  Cardiovascular: Normal S1, S2. No MRG. Regular rate and rhythm. No peripheral edema, cyanosis or pallor.  Gastrointestinal:  Soft, nondistended, nontender. No rebound or guarding. Normal bowel sounds. No appreciable masses or hepatomegaly. Rectal:  Not performed.  Msk:  Symmetrical without gross deformities. Without edema, no deformity or joint abnormality.  Neurologic:  Alert and  oriented x4;  grossly normal neurologically.  Skin:   Dry and intact without significant lesions or rashes. Psychiatric: Oriented to person, place and time. Demonstrates good judgement and reason without abnormal affect or behaviors.  RELEVANT LABS AND IMAGING: CBC    Component Value Date/Time   WBC 5.1 02/27/2024 0943   RBC 4.75 02/27/2024 0943   HGB 12.5 02/27/2024 0943   HCT 39.2 02/27/2024 0943   PLT 216.0 02/27/2024 0943   MCV 82.5 02/27/2024 0943   MCH 25.5 (L) 12/24/2016 0342   MCHC 32.0 02/27/2024 0943   RDW 16.0 (H) 02/27/2024 0943   LYMPHSABS 2.0 02/27/2024  0943   MONOABS 0.4 02/27/2024 0943   EOSABS 0.1 02/27/2024 0943   BASOSABS 0.0 02/27/2024 0943     CMP     Component Value Date/Time   NA 142 02/27/2024 0943   K 4.0 02/27/2024 0943   CL 106 02/27/2024 0943   CO2 30 02/27/2024 0943   GLUCOSE 86 02/27/2024 0943   GLUCOSE 113 (H) 02/19/2006 0719   BUN 9 02/27/2024 0943   CREATININE 0.85 02/27/2024 0943   CALCIUM  9.2 02/27/2024 0943   PROT 7.3 02/27/2024 0943   ALBUMIN 3.8 02/27/2024 0943   AST 14 02/27/2024 0943   ALT 15 02/27/2024 0943   ALKPHOS 64 02/27/2024 0943   BILITOT 0.3 02/27/2024 0943   GFRNONAA >60 12/01/2021 0830   GFRAA >60 12/24/2016 0342    Assessment: 1. ***  Plan: 1. ***     Delon Failing, PA-C [facility] 04/20/2024, 8:42 AM  Cc: Mahlon Comer BRAVO, MD  "

## 2024-04-21 ENCOUNTER — Encounter: Payer: Self-pay | Admitting: Physician Assistant

## 2024-04-21 ENCOUNTER — Other Ambulatory Visit (HOSPITAL_COMMUNITY): Payer: Self-pay

## 2024-04-21 ENCOUNTER — Ambulatory Visit: Admitting: Physician Assistant

## 2024-04-21 VITALS — BP 122/62 | HR 75 | Ht 63.0 in | Wt 260.0 lb

## 2024-04-21 DIAGNOSIS — Z01818 Encounter for other preprocedural examination: Secondary | ICD-10-CM

## 2024-04-21 DIAGNOSIS — Z1211 Encounter for screening for malignant neoplasm of colon: Secondary | ICD-10-CM

## 2024-04-21 MED ORDER — NA SULFATE-K SULFATE-MG SULF 17.5-3.13-1.6 GM/177ML PO SOLN
1.0000 | Freq: Once | ORAL | 0 refills | Status: AC
  Filled 2024-04-21: qty 354, 1d supply, fill #0

## 2024-04-21 NOTE — Patient Instructions (Signed)

## 2024-05-07 ENCOUNTER — Encounter: Payer: Self-pay | Admitting: Internal Medicine

## 2024-05-14 ENCOUNTER — Ambulatory Visit: Admitting: Internal Medicine

## 2024-05-14 ENCOUNTER — Encounter: Payer: Self-pay | Admitting: Internal Medicine

## 2024-05-14 VITALS — BP 100/63 | HR 64 | Temp 97.4°F | Resp 16 | Ht 63.0 in | Wt 260.0 lb

## 2024-05-14 DIAGNOSIS — Z1211 Encounter for screening for malignant neoplasm of colon: Secondary | ICD-10-CM

## 2024-05-14 DIAGNOSIS — D123 Benign neoplasm of transverse colon: Secondary | ICD-10-CM

## 2024-05-14 DIAGNOSIS — D125 Benign neoplasm of sigmoid colon: Secondary | ICD-10-CM

## 2024-05-14 MED ORDER — SODIUM CHLORIDE 0.9 % IV SOLN: 500.0000 mL | Freq: Once | INTRAVENOUS | Status: DC

## 2024-05-14 NOTE — Progress Notes (Signed)
 VS by KP

## 2024-05-14 NOTE — Progress Notes (Signed)
 Report given to PACU, vss

## 2024-05-14 NOTE — Op Note (Signed)
 [facility] Patient Name: Ashlee Williams Procedure Date: 05/14/2024 8:04 AM MRN: [id number] Endoscopist: Lupita FORBES Commander , MD, [id number] Age: 69 Referring MD:  Date of Birth: 09-05-1955 Gender: Female Account #: [id number] Procedure:                Colonoscopy Indications:              Screening for colorectal malignant neoplasm, Last                            colonoscopy: January 2016 Medicines:                Monitored Anesthesia Care Procedure:                Pre-Anesthesia Assessment:                           - Prior to the procedure, a History and Physical                            was performed, and patient medications and                            allergies were reviewed. The patient's tolerance of                            previous anesthesia was also reviewed. The risks                            and benefits of the procedure and the sedation                            options and risks were discussed with the patient.                            All questions were answered, and informed consent                            was obtained. Prior Anticoagulants: The patient has                            taken no anticoagulant or antiplatelet agents. ASA                            Grade Assessment: III - A patient with severe                            systemic disease. After reviewing the risks and                            benefits, the patient was deemed in satisfactory                            condition to undergo the procedure.  After obtaining informed consent, the colonoscope                            was passed under direct vision. Throughout the                            procedure, the patient's blood pressure, pulse, and                            oxygen saturations were monitored continuously. The                            Olympus CF-HQ190L ([id number]) Colonoscope was                            introduced through the anus and  advanced to the the                            cecum, identified by appendiceal orifice and                            ileocecal valve. The colonoscopy was somewhat                            difficult due to a redundant colon. Successful                            completion of the procedure was aided by applying                            abdominal pressure. The patient tolerated the                            procedure well. The quality of the bowel                            preparation was good. The ileocecal valve,                            appendiceal orifice, and rectum were photographed.                            The bowel preparation used was SUPREP via split                            dose instruction. Scope In: 8:14:38 AM Scope Out: 8:40:49 AM Scope Withdrawal Time: 0 hours 22 minutes 51 seconds  Total Procedure Duration: 0 hours 26 minutes 11 seconds  Findings:                 The perianal and digital rectal examinations were                            normal except for anal tags.  Three sessile and semi-pedunculated polyps were                            found in the sigmoid colon and transverse colon.                            The polyps were 4 to 7 mm in size. These polyps                            were removed with a cold snare. Resection and                            retrieval were complete. Verification of patient                            identification for the specimen was done. Estimated                            blood loss was minimal.                           Internal hemorrhoids were found.                           The exam was otherwise without abnormality on                            direct and retroflexion views. Complications:            No immediate complications. Estimated Blood Loss:     Estimated blood loss was minimal. Impression:               - Three 4 to 7 mm polyps in the sigmoid colon and                             in the transverse colon, removed with a cold snare.                            Resected and retrieved.                           - Internal hemorrhoids.                           - The examination was otherwise normal on direct                            and retroflexion views except for anal tags. Recommendation:           - Patient has a contact number available for                            emergencies. The signs and symptoms of potential  delayed complications were discussed with the                            patient. Return to normal activities tomorrow.                            Written discharge instructions were provided to the                            patient.                           - Resume previous diet.                           - Continue present medications.                           - Repeat colonoscopy is recommended. The                            colonoscopy date will be determined after pathology                            results from today's exam become available for                            review. Lupita FORBES Commander, MD 05/14/2024 8:49:34 AM This report has been signed electronically.

## 2024-05-14 NOTE — Patient Instructions (Addendum)
 I found and removed 3 polyps that all look benign but probably precancerous.  They will be analyzed and I will let you know when to have a repeat colonoscopy.  You had some small swollen internal hemorrhoids, this is common.  Polyps are also common.  They are removed so cannot turn into colon cancer.  I appreciate the opportunity to care for you. Lupita CHARLENA Commander, MD, [facility]  Resume previous diet. Continue present medications. Repeat colonoscopy is recommended. The colonoscopy date will be determined after pathology results from today's exam become available for review.  Handouts provided on polyps and hemorrhoids.   YOU HAD AN ENDOSCOPIC PROCEDURE TODAY AT THE [facility]:   Refer to the procedure report that was given to you for any specific questions about what was found during the examination.  If the procedure report does not answer your questions, please call your gastroenterologist to clarify.  If you requested that your care partner not be given the details of your procedure findings, then the procedure report has been included in a sealed envelope for you to review at your convenience later.  YOU SHOULD EXPECT: Some feelings of bloating in the abdomen. Passage of more gas than usual.  Walking can help get rid of the air that was put into your GI tract during the procedure and reduce the bloating. If you had a lower endoscopy (such as a colonoscopy or flexible sigmoidoscopy) you may notice spotting of blood in your stool or on the toilet paper. If you underwent a bowel prep for your procedure, you may not have a normal bowel movement for a few days.  Please Note:  You might notice some irritation and congestion in your nose or some drainage.  This is from the oxygen used during your procedure.  There is no need for concern and it should clear up in a day or so.  SYMPTOMS TO REPORT IMMEDIATELY:  Following lower endoscopy (colonoscopy or flexible sigmoidoscopy):  Excessive  amounts of blood in the stool  Significant tenderness or worsening of abdominal pains  Swelling of the abdomen that is new, acute  Fever of 100F or higher  For urgent or emergent issues, a gastroenterologist can be reached at any hour by calling [phone]. Do not use MyChart messaging for urgent concerns.    DIET:  We do recommend a small meal at first, but then you may proceed to your regular diet.  Drink plenty of fluids but you should avoid alcoholic beverages for 24 hours.  ACTIVITY:  You should plan to take it easy for the rest of today and you should NOT DRIVE or use heavy machinery until tomorrow (because of the sedation medicines used during the test).    FOLLOW UP: Our staff will call the number listed on your records the next business day following your procedure.  We will call around 7:15- 8:00 am to check on you and address any questions or concerns that you may have regarding the information given to you following your procedure. If we do not reach you, we will leave a message.     If any biopsies were taken you will be contacted by phone or by letter within the next 1-3 weeks.  Please call us  at [phone] if you have not heard about the biopsies in 3 weeks.    SIGNATURES/CONFIDENTIALITY: You and/or your care partner have signed paperwork which will be entered into your electronic medical record.  These signatures attest to the fact that  that the information above on your After Visit Summary has been reviewed and is understood.  Full responsibility of the confidentiality of this discharge information lies with you and/or your care-partner.

## 2024-05-14 NOTE — Progress Notes (Signed)
 History and Physical Interval Note:  05/14/2024 8:08 AM  Ashlee Williams  has presented today for endoscopic procedure(s), with the diagnosis of  Encounter Diagnosis  Name Primary?   Screening for colon cancer Yes  .  The various methods of evaluation and treatment have been discussed with the patient and/or family. After consideration of risks, benefits and other options for treatment, the patient has consented to  the endoscopic procedure(s).   The patient's history has been reviewed, patient examined, no change in status, stable for endoscopic procedure(s).  I have reviewed the patient's chart and labs.  Questions were answered to the patient's satisfaction.     Lupita CHARLENA Commander, MD, [facility]

## 2024-05-14 NOTE — Progress Notes (Signed)
 Called to room to assist during endoscopic procedure.  Patient ID and intended procedure confirmed with present staff. Received instructions for my participation in the procedure from the performing physician.

## 2024-05-15 ENCOUNTER — Telehealth: Payer: Self-pay

## 2024-05-15 NOTE — Telephone Encounter (Signed)
" °  Follow up Call-     05/14/2024    7:23 AM  Call back number  Post procedure Call Back phone  # [phone]  Permission to leave phone message Yes     Patient questions:  Do you have a fever, pain , or abdominal swelling? No. Pain Score  0 *  Have you tolerated food without any problems? Yes.    Have you been able to return to your normal activities? Yes.    Do you have any questions about your discharge instructions: Diet   No. Medications  No. Follow up visit  No.  Do you have questions or concerns about your Care? No.  Actions: * If pain score is 4 or above: No action needed, pain <4.   "

## 2024-08-26 ENCOUNTER — Ambulatory Visit: Admitting: Family Medicine
